# Patient Record
Sex: Female | Born: 1955 | Race: White | Hispanic: No | Marital: Married | Smoking: Former smoker
Health system: Southern US, Community
[De-identification: ages and names within clinical notes are randomized; demographics above are authoritative.]

## PROBLEM LIST (undated history)

## (undated) DIAGNOSIS — M1990 Unspecified osteoarthritis, unspecified site: Secondary | ICD-10-CM

## (undated) DIAGNOSIS — Z9889 Other specified postprocedural states: Secondary | ICD-10-CM

## (undated) DIAGNOSIS — D649 Anemia, unspecified: Secondary | ICD-10-CM

## (undated) DIAGNOSIS — C50412 Malignant neoplasm of upper-outer quadrant of left female breast: Principal | ICD-10-CM

## (undated) DIAGNOSIS — D219 Benign neoplasm of connective and other soft tissue, unspecified: Secondary | ICD-10-CM

## (undated) DIAGNOSIS — E785 Hyperlipidemia, unspecified: Secondary | ICD-10-CM

## (undated) DIAGNOSIS — J189 Pneumonia, unspecified organism: Secondary | ICD-10-CM

## (undated) DIAGNOSIS — Z8249 Family history of ischemic heart disease and other diseases of the circulatory system: Secondary | ICD-10-CM

## (undated) DIAGNOSIS — Z5111 Encounter for antineoplastic chemotherapy: Secondary | ICD-10-CM

## (undated) DIAGNOSIS — R112 Nausea with vomiting, unspecified: Secondary | ICD-10-CM

## (undated) DIAGNOSIS — G629 Polyneuropathy, unspecified: Secondary | ICD-10-CM

## (undated) HISTORY — DX: Benign neoplasm of connective and other soft tissue, unspecified: D21.9

## (undated) HISTORY — DX: Family history of ischemic heart disease and other diseases of the circulatory system: Z82.49

## (undated) HISTORY — PX: DILATION AND CURETTAGE OF UTERUS: SHX78

## (undated) HISTORY — PX: TONSILLECTOMY: SUR1361

## (undated) HISTORY — PX: GANGLION CYST EXCISION: SHX1691

## (undated) HISTORY — DX: Hyperlipidemia, unspecified: E78.5

## (undated) HISTORY — DX: Malignant neoplasm of upper-outer quadrant of left female breast: C50.412

## (undated) HISTORY — PX: ANTERIOR CRUCIATE LIGAMENT REPAIR: SHX115

---

## 1976-05-25 HISTORY — PX: WISDOM TOOTH EXTRACTION: SHX21

## 1987-05-26 HISTORY — PX: LAPAROSCOPIC ENDOMETRIOSIS FULGURATION: SUR769

## 1988-05-25 HISTORY — PX: ABDOMINAL HYSTERECTOMY: SHX81

## 1998-10-04 ENCOUNTER — Other Ambulatory Visit: Admission: RE | Admit: 1998-10-04 | Discharge: 1998-10-04 | Payer: Self-pay | Admitting: Family Medicine

## 2004-09-09 ENCOUNTER — Ambulatory Visit: Payer: Self-pay | Admitting: Family Medicine

## 2005-05-25 HISTORY — PX: CYST EXCISION: SHX5701

## 2005-08-12 ENCOUNTER — Ambulatory Visit: Payer: Self-pay | Admitting: Family Medicine

## 2009-08-21 ENCOUNTER — Ambulatory Visit: Payer: Self-pay | Admitting: Cardiovascular Disease

## 2009-08-22 ENCOUNTER — Telehealth: Payer: Self-pay | Admitting: Cardiovascular Disease

## 2010-06-24 NOTE — Progress Notes (Signed)
  Phone Note Outgoing Call   Call placed by: Dessie Coma LPN Call placed to: Patient Summary of Call: Patient notified per Dr. Freida Busman, labs are completely normal specifically no indication of blood clot.  No further studies needed at this time.

## 2010-10-07 NOTE — Assessment & Plan Note (Signed)
[facility]                         CARDIOLOGY OFFICE NOTE   NAME:Essman, COSTELLA SCHWARZ                     MRN:          [id number]  DATE:08/21/2009                            DOB:          1955/11/01    CHIEF COMPLAINT:  History of chest pain.   HISTORY OF PRESENT ILLNESS:  Ms. Bang is a 55 year old white female  with past medical history significant for family history of premature  coronary artery disease presenting for further evaluation after an  episode of chest discomfort on 8th of this month.  The patient states  she woke that morning with a distinct area of left-sided chest  discomfort associated with some shortness of breath and perhaps mild  dizziness.  The patient reported to her primary care physician's office  and was eventually sent to the emergency room.  The pain was somewhat  relieved with nitroglycerin.  It did not radiate.  Pain was present for  approximately 6-7 hours.  The patient was admitted to the hospital and  ruled out for myocardial infarction with completely normal cardiac  biomarkers.  She underwent an echocardiogram which demonstrated normal  ejection fraction.  She also underwent a exercise stress test which she  exercised to 6 METS and was found to have an ejection fraction of 60%  and no areas of inducible ischemia.  The patient states that she had  increased fatigue and some dyspnea on exertion for several days after  the stress test; however, she has since returned to her baseline.  She  has not had any recurrent chest discomfort or shortness of breath.  Of  note, the patient states several years ago she had a similar episode of  chest discomfort that is also followed by negative stress test.   PAST MEDICAL HISTORY:  Mild hyperlipidemia, orthopedic injuries to the  right knee.  She has had 4 C-sections and abdominal laparoscopy,  tonsillectomy, and ganglion cyst removal.   SOCIAL HISTORY:  She smoked for  about 6-9 months in her 38s.  No  recurrent tobacco use.  No significant alcohol intake.   ALLERGIES:  VICODIN.   CURRENT MEDICATIONS:  Aspirin 81 mg daily, omeprazole 20 mg daily which  was started after this episode of chest discomfort, metoprolol 12.5 mg  b.i.d. which she started after this episode of chest discomfort.  Lipitor 20 mg daily.  Cranberry tablets, fiber tablets, multivitamin,  and vitamin C.   REVIEW OF SYSTEMS:  Positive for some psychosocial stressors.  Other  systems as in HPI are otherwise negative.   PHYSICAL EXAMINATION:  VITAL SIGNS:  Her blood pressure is 119/68, pulse  is 83, satting 96% on room air, and she weighs 164 pounds.  GENERAL:  No acute distress.  HEENT:  Normocephalic, atraumatic.  NECK:  Supple.  There is no JVD.  There is no carotid bruit.  HEART:  Regular rate and rhythm without murmur, rub, or gallop.  LUNGS:  Clear bilaterally.  ABDOMEN:  Soft, nontender, nondistended.  EXTREMITIES:  Without edema.  SKIN:  Warm and dry.  PSYCHIATRIC:  The patient is appropriate with normal levels of insight.  MUSCULOSKELETAL:  A 5/5 bilateral upper and lower extremity strength.  NEURO:  Nonfocal.   EKG taken today in clinic independently reviewed by myself demonstrates  normal sinus rhythm without any significant ST or T-wave abnormalities.  Review of the stress test report as above in HPI.   I personally reviewed the echocardiogram which demonstrates an ejection  fraction in the low normal range of 50-55%.  Her mitral valve and flow  pattern was normal as was her tissue Doppler.  Her ED prime ratio was 7.  Review of the patient's labs indicated a completely normal troponins as  an inpatient.  Review of her BMP and CBC were also within normal limits.   ASSESSMENT:  A 55 year old female with mild hyperlipidemia and family  history of premature coronary artery disease presenting with an episode  of atypical chest discomfort followed by some shortness of  breath for  the next few days.  It does not appear that these symptoms were cardiac  in origin.  Because of the increased dyspnea, pulmonary embolism remains  a possibility.  Fortunately, the patient's symptoms have now completely  resolved.   PLAN:  We will check a D-dimer today.  If the D-dimer is abnormal, we  will pursue further imaging to rule out venous thrombosis.  I see no  need for a further ischemia evaluation at this time.  The patient is  reminded that because of her family history of premature coronary artery  disease, she is at increased risk for developing significant coronary  artery disease in the future.  She is encouraged to partake in an  exercise routine of at least 30 minutes daily.  It is reasonable for her  to continue on aspirin 81 mg daily.  Her other risk factors and health  maintenance screening including a mammograms which she is not up-to-date  with will be followed up by her primary care physician.  We will contact  her after the results of the D-dimer; otherwise, we will see her in 1  year's time.  She will contact our office in the interim if any problems  occur.     Brayton El, MD  Electronically Signed    SGA/MedQ  DD: 08/21/2009  DT: 08/22/2009  Job #: [id number]

## 2010-10-07 NOTE — Letter (Signed)
August 21, 2009    Marlow Baars, MD  [address]  [place], [place] [address]   RE:  Alexandra Mathis, Alexandra Mathis  MRN:  [id number]  /  DOB:  Apr 13, 1956   Dear Dr. Ardyth Gal:   I had the pleasure of seeing Alexandra Mathis in clinic this morning.  As  you know, she is a 55 year old female who recently had an episode of  chest discomfort that was followed by stress test and echocardiogram.  Although her exercise tolerance during the stress test was somewhat less  than expected, her imaging was negative for any inducible ischemia.  I  have personally reviewed the echocardiogram which demonstrates low  normal ejection fraction of approximately 55%.  There is no evidence of  diastolic dysfunction on the study.  I believe that her chest discomfort  was noncardiac in etiology and it has not reoccurred.  I am somewhat  concerned that she remained short of breath for several days after the  stress test.  I will be checking a D-dimer in clinic today and if it is  abnormal, will pursue further workup for venous thromboembolism.   Thank you for the referral of this patient and I look forward to  following her along with you.  Please contact my office if I can be of  any further assistance.    Sincerely,      Brayton El, MD  Electronically Signed    SGA/MedQ  DD: 08/21/2009  DT: 08/21/2009  Job #: [id number]

## 2011-01-22 ENCOUNTER — Encounter: Payer: Self-pay | Admitting: Cardiovascular Disease

## 2011-02-13 ENCOUNTER — Encounter: Payer: Self-pay | Admitting: Cardiovascular Disease

## 2011-03-25 ENCOUNTER — Encounter: Payer: Self-pay | Admitting: Cardiovascular Disease

## 2015-05-26 HISTORY — PX: BREAST BIOPSY: SHX20

## 2015-12-05 ENCOUNTER — Encounter: Payer: Self-pay | Admitting: *Deleted

## 2015-12-05 ENCOUNTER — Telehealth: Payer: Self-pay | Admitting: *Deleted

## 2015-12-05 DIAGNOSIS — Z170 Estrogen receptor positive status [ER+]: Secondary | ICD-10-CM

## 2015-12-05 DIAGNOSIS — C50412 Malignant neoplasm of upper-outer quadrant of left female breast: Secondary | ICD-10-CM

## 2015-12-05 HISTORY — DX: Malignant neoplasm of upper-outer quadrant of left female breast: C50.412

## 2015-12-05 NOTE — Telephone Encounter (Signed)
Confirmed [facility] for 12/11/15 at 0815 .  Instructions and contact information given.

## 2015-12-05 NOTE — Telephone Encounter (Signed)
Mailed clinic packet to pt.  

## 2015-12-11 ENCOUNTER — Encounter: Payer: Self-pay | Admitting: Physical Therapy

## 2015-12-11 ENCOUNTER — Ambulatory Visit: Payer: BC Managed Care – PPO | Attending: General Surgery | Admitting: Physical Therapy

## 2015-12-11 ENCOUNTER — Telehealth: Payer: Self-pay | Admitting: Oncology

## 2015-12-11 ENCOUNTER — Other Ambulatory Visit (HOSPITAL_BASED_OUTPATIENT_CLINIC_OR_DEPARTMENT_OTHER): Payer: BC Managed Care – PPO

## 2015-12-11 ENCOUNTER — Other Ambulatory Visit: Payer: Self-pay | Admitting: *Deleted

## 2015-12-11 ENCOUNTER — Ambulatory Visit
Admission: RE | Admit: 2015-12-11 | Discharge: 2015-12-11 | Disposition: A | Discharge status: Home / Self Care | Payer: BC Managed Care – PPO | Source: Ambulatory Visit | Attending: Radiation Oncology | Admitting: Radiation Oncology

## 2015-12-11 ENCOUNTER — Ambulatory Visit (HOSPITAL_BASED_OUTPATIENT_CLINIC_OR_DEPARTMENT_OTHER): Payer: BC Managed Care – PPO | Admitting: Oncology

## 2015-12-11 ENCOUNTER — Encounter: Payer: Self-pay | Admitting: Oncology

## 2015-12-11 ENCOUNTER — Other Ambulatory Visit: Payer: Self-pay | Admitting: General Surgery

## 2015-12-11 ENCOUNTER — Encounter: Payer: Self-pay | Admitting: *Deleted

## 2015-12-11 VITALS — BP 121/79 | HR 86 | Temp 98.4°F | Resp 18 | Ht 67.5 in | Wt 151.5 lb

## 2015-12-11 DIAGNOSIS — C50412 Malignant neoplasm of upper-outer quadrant of left female breast: Secondary | ICD-10-CM | POA: Insufficient documentation

## 2015-12-11 DIAGNOSIS — R293 Abnormal posture: Secondary | ICD-10-CM | POA: Diagnosis present

## 2015-12-11 DIAGNOSIS — Z170 Estrogen receptor positive status [ER+]: Secondary | ICD-10-CM

## 2015-12-11 LAB — CBC WITH DIFFERENTIAL/PLATELET
BASO%: 0.5 % (ref 0.0–2.0)
Basophils Absolute: 0 10*3/uL (ref 0.0–0.1)
EOS%: 1.5 % (ref 0.0–7.0)
Eosinophils Absolute: 0.1 10*3/uL (ref 0.0–0.5)
HCT: 37.9 % (ref 34.8–46.6)
HGB: 13 g/dL (ref 11.6–15.9)
LYMPH%: 37.6 % (ref 14.0–49.7)
MCH: 30 pg (ref 25.1–34.0)
MCHC: 34.3 g/dL (ref 31.5–36.0)
MCV: 87.5 fL (ref 79.5–101.0)
MONO#: 0.5 10*3/uL (ref 0.1–0.9)
MONO%: 12.1 % (ref 0.0–14.0)
NEUT#: 1.9 10*3/uL (ref 1.5–6.5)
NEUT%: 48.3 % (ref 38.4–76.8)
PLATELETS: 156 10*3/uL (ref 145–400)
RBC: 4.33 10*6/uL (ref 3.70–5.45)
RDW: 13.6 % (ref 11.2–14.5)
WBC: 3.9 10*3/uL (ref 3.9–10.3)
lymph#: 1.5 10*3/uL (ref 0.9–3.3)

## 2015-12-11 LAB — COMPREHENSIVE METABOLIC PANEL
ALT: 15 U/L (ref 0–55)
ANION GAP: 8 meq/L (ref 3–11)
AST: 19 U/L (ref 5–34)
Albumin: 4 g/dL (ref 3.5–5.0)
Alkaline Phosphatase: 64 U/L (ref 40–150)
BUN: 16.6 mg/dL (ref 7.0–26.0)
CHLORIDE: 105 meq/L (ref 98–109)
CO2: 28 meq/L (ref 22–29)
CREATININE: 0.8 mg/dL (ref 0.6–1.1)
Calcium: 9.5 mg/dL (ref 8.4–10.4)
EGFR: 78 mL/min/{1.73_m2} — ABNORMAL LOW (ref >90)
Glucose: 92 mg/dl (ref 70–140)
Potassium: 4.8 mEq/L (ref 3.5–5.1)
SODIUM: 141 meq/L (ref 136–145)
Total Bilirubin: 0.56 mg/dL (ref 0.20–1.20)
Total Protein: 7.5 g/dL (ref 6.4–8.3)

## 2015-12-11 NOTE — Telephone Encounter (Signed)
appt made and avs printed °

## 2015-12-11 NOTE — Progress Notes (Signed)
[facility]  [phone] [phone]     ID: MAEZIE JUSTIN DOB: [birth year]  MR#: [id number]  YSA#:[id number]  Patient Care Team: Darrol Jump, PA-C as PCP - General (Family Medicine) Fanny Skates, MD as Consulting Physician (General Surgery) Chauncey Cruel, MD as Consulting Physician (Oncology) Kyung Rudd, MD as Consulting Physician (Radiation Oncology) Larey Dresser, MD as Consulting Physician (Cardiology) OTHER MD:  CHIEF COMPLAINT: Triple positive breast cancer  CURRENT TREATMENT: Paclitaxel, trastuzumab, and pertuzumab   BREAST CANCER HISTORY: "Alexandra Mathis" had screening mammography in June 2017 showing upper outer quadrant calcifications. Accordingly she was referred for left diagnostic mammography and ultrasonography, performed 11/27/2015. The breast density was category C. Compression views of the left breast found pleomorphic calcifications in the upper outer quadrant measuring up to 4.0 cm. There was an area of associated architectural distortion in the upper outer quadrant as well. On physical exam there was an irregular thickening in the left breast at the 2:00 position anteriorly. Ultrasonography confirmed a hypoechoic irregular mass measuring 1.6 cm in the 2:00 position 2 cm from the nipple. An intramammary lymph node was noted with benign appearing. In the left axilla there was a lymph node with focally thickened cortex.  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 Biopsy of the left axillary lymph node was negative.  Her subsequent history is as detailed below  INTERVAL HISTORY: Alexandra Mathis was evaluated in the multidisciplinary breast  cancer clinic 12/11/2015 accompanied by her husband Alexandra Mathis. Her case was also presented in the multidisciplinary breast cancer conference that same morning. At that time a preliminary plan was proposed: Neoadjuvant chemotherapy and anti-HER-2 immunotherapy, genetics testing, consideration of mastectomy versus ample lumpectomy and sentinel lymph node sampling, followed by radiation and anti-estrogens  REVIEW OF SYSTEMS: There were no specific symptoms leading to the original mammogram, which was routinely scheduled. The patient denies unusual headaches, visual changes, nausea, vomiting, stiff neck, dizziness, or gait imbalance. There has been no cough, phlegm production, or pleurisy, no chest pain or pressure, and no change in bowel or bladder habits. The patient denies fever, rash, bleeding, unexplained fatigue or unexplained weight loss. She has minor arthritis pain involving her back and hands, which is not more intense or persistent than prior. A detailed review of systems was otherwise entirely negative.  PAST MEDICAL HISTORY: Past Medical History  Diagnosis Date  . Chest pain   . Family history of premature CAD   . HLD (hyperlipidemia)   . Breast cancer of upper-outer quadrant of left female breast ([facility]) 12/05/2015  . Breast cancer ([facility])     PAST SURGICAL HISTORY: Past Surgical History  Procedure Laterality Date  . Tonsillectomy    . Ganglion cyst excision    . Cesarean section      x4  . Anterior cruciate ligament repair      right knee  . Hysterectomy - unknown type      FAMILY HISTORY Family History  Problem Relation Age of Onset  . Cancer    . Heart attack    . Other Mother     [place]ine Meningioma  . Colon cancer Father   . Ovarian cancer Sister   . Breast cancer Sister   .  Other Paternal Aunt     Peritoneal cancer  . Cancer Paternal Uncle   . Ovarian cancer Sister   . Breast cancer Sister   . Breast cancer Cousin   . Ovarian cancer Cousin   . Ovarian cancer Cousin     . Breast cancer Cousin   . Breast cancer Cousin     GYNECOLOGIC HISTORY:  No LMP recorded. Menarche age 9, first live birth age 60. The patient is GX P4. She stopped having periods in 1989. She did not use hormone replacement. She used oral contraceptives less than one year.  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 Her husband Alexandra Mathis is retired from a [employer]. Son Alexandra Mathis lives in [place] and works as a Chief Strategy Officer. Son Alexandra Mathis lives in [place] and is with the [employer]. Son Alexandra Mathis this and cocoa [place] where he works as an Chief Financial Officer. Son Alexandra Mathis lives in [place] were he works as an Chief Financial Officer. The patient has 4 grandchildren including a 85-year-old they have custody of and lives with them. The patient attends our [employer]   HEALTH MAINTENANCE: Social History  Substance Use Topics  . Smoking status: Former Smoker -- .8 years    Quit date: 05/25/1974  . Smokeless tobacco: None  . Alcohol Use: 0.5 oz/week    1 Standard drinks or equivalent per week     Colonoscopy:  PAP:  Bone density:   Allergies  Allergen Reactions  . Morphine And Related   . Vicodin [Hydrocodone-Acetaminophen]     Current Outpatient Prescriptions  Medication Sig Dispense Refill  . aspirin 81 MG tablet Take 81 mg by mouth daily.      Marland Kitchen atorvastatin (LIPITOR) 20 MG tablet Take 20 mg by mouth daily.      Marland Kitchen etodolac (LODINE) 400 MG tablet Take 400 mg by mouth 2 (two) times daily with a meal.  2  . FIBER COMPLETE PO Take by mouth daily.      . Multiple Vitamin (MULTIVITAMIN) tablet Take 1 tablet by mouth daily.      . Vitamins C E (CRANBERRY CONCENTRATE PO) Take by mouth daily.      . metoprolol tartrate (LOPRESSOR) 25 MG tablet Take 12.5 mg by mouth 2 (two) times daily.      Marland Kitchen omeprazole (PRILOSEC) 20 MG capsule Take 20 mg by mouth daily.      . vitamin C (ASCORBIC ACID) 500 MG tablet Take 500  mg by mouth daily.       No current facility-administered medications for this visit.    OBJECTIVE: Middle-aged white woman who appears younger than stated age 60 Vitals:   12/11/15 0905  BP: 121/79  Pulse: 86  Temp: 98.4 F (36.9 C)  Resp: 18     Body mass index is 23.36 kg/(m^2).    ECOG FS:0 - Asymptomatic  Ocular: Sclerae unicteric, pupils equal, round and reactive to light Ear-nose-throat: Oropharynx clear and moist Lymphatic: No cervical or supraclavicular adenopathy Lungs no rales or rhonchi, good excursion bilaterally Heart regular rate and rhythm, no murmur appreciated Abd soft, nontender, positive bowel sounds MSK no focal spinal tenderness, no joint edema Neuro: non-focal, well-oriented, appropriate affect Breasts: The right breast is unremarkable. The left breast is status post recent biopsy and there is a significant ecchymosis. There is no other finding of concern. There is no nipple retraction. The left axilla is benign.   LAB  RESULTS:  CMP     Component Value Date/Time   NA 141 12/11/2015 0839   K 4.8 12/11/2015 0839   CO2 28 12/11/2015 0839   GLUCOSE 92 12/11/2015 0839   BUN 16.6 12/11/2015 0839   CREATININE 0.8 12/11/2015 0839   CALCIUM 9.5 12/11/2015 0839   PROT 7.5 12/11/2015 0839   ALBUMIN 4.0 12/11/2015 0839   AST 19 12/11/2015 0839   ALT 15 12/11/2015 0839   ALKPHOS 64 12/11/2015 0839   BILITOT 0.56 12/11/2015 0839    INo results found for: SPEP, UPEP  Lab Results  Component Value Date   WBC 3.9 12/11/2015   NEUTROABS 1.9 12/11/2015   HGB 13.0 12/11/2015   HCT 37.9 12/11/2015   MCV 87.5 12/11/2015   PLT 156 12/11/2015      Chemistry      Component Value Date/Time   NA 141 12/11/2015 0839   K 4.8 12/11/2015 0839   CO2 28 12/11/2015 0839   BUN 16.6 12/11/2015 0839   CREATININE 0.8 12/11/2015 0839      Component Value Date/Time   CALCIUM 9.5 12/11/2015 0839   ALKPHOS 64 12/11/2015 0839   AST 19 12/11/2015 0839   ALT 15  12/11/2015 0839   BILITOT 0.56 12/11/2015 0839       No results found for: LABCA2  No components found for: [id number]  No results for input(s): INR in the last 168 hours.  Urinalysis No results found for: COLORURINE, APPEARANCEUR, LABSPEC, PHURINE, GLUCOSEU, HGBUR, BILIRUBINUR, KETONESUR, PROTEINUR, UROBILINOGEN, NITRITE, LEUKOCYTESUR   STUDIES: Outside films reviewed  ELIGIBLE FOR AVAILABLE RESEARCH PROTOCOL: no  ASSESSMENT: 60 y.o. [place], [place] woman status post left breast upper outer quadrant biopsy 12/02/2015 for a clinical T1c pN0, stage IA  invasive ductal carcinoma, grade 3, estrogen and progesterone receptor positive, with an MIB-1 of 15%, and HER-2 amplification  (1) neoadjuvant chemotherapy will consist of weekly paclitaxel 12 with trastuzumab and pertuzumab every 21 days 4  (2) trastuzumab to be continued to total of one year  (a) baseline echocardiogram pending  (3) definitive surgery to follow chemotherapy  (4) adjuvant radiation to follow as appropriate  (5) anti-estrogens to follow at the completion of local treatment  (6) genetics testing pending     PLAN: We spent the better part of today's hour-long appointment discussing the biology of breast cancer in general, and the specifics of the patient's tumor in particular. We first reviewed the fact that cancer is not one disease but more than 100 different diseases and that it is important to keep them separate-- otherwise when friends and relatives discuss their own cancer experiences with Alexandra Mathis        confusion can result. Similarly we discussed that if breast cancer spreads to the bones, lungs or liver, Alexandra Mathis would not have bone cancer or liver cancer, but breast cancer in the bone and breast cancer in the liver-- one cancer in three places-- not three different cancers, which otherwise would have to be treated in 3 different ways.  We emphasized the difference between local and systemic therapies.  In terms of loco/regional treatment, lumpectomy plus radiation is equivalent to mastectomy as far as survival is concerned. We also noted that in terms of sequencing of treatments, whether systemic therapy or surgery is done first does not affect the ultimate outcome.  We then reviewed systemic therapy options. These include anti-estrogens, anti-HER-2 treatment, and chemotherapy. She is a good candidate for all 3. This is very favorable because if we assume  her risk of recurrence (after local treatment only) is X, chemotherapy will lower X by one third, anti-HER-2 treatment will cut the remaining 2/3X in half, and anti-estrogen treatment will cut the remaining 1/3X in half again, so her final risk of recurrence will be approximately 1/6 of her baseline risk.  A high percentage of patients like Alexandra Mathis achieve a complete pathologic response with neo-adjuvant chemotherapy. This means when the surgeon removes the area of cancer, no cancer is seen by pathology in the breast or lymph nodes. Patients who achieve a complete pathologic response have a better long term prognosis. In addition, Alexandra Mathis needs to undergo genetics testing, and if positive she would consider bilateral mastectomies. This means her definitive surgery we will be delayed while she explores the multiple possibilities available.  Accordingly we recommend starting with systemic treatment in Alexandra Mathis's case. Standard therapy in this situation is paclitaxel given weekly 12, with trastuzumab, and pertuzumab given every 21 days 4, followed by continuing trastuzumab to complete one year. We discussed the possible toxicities, side effects and complications of these treatments including the possibility of damage to the heart muscle, which is the reason she will see cardio oncology and undergo echocardiography every 3 months while on anti-HER-2 treatment  Alexandra Mathis will also meet with our chemotherapy teaching nurse, have a breast MRI, and have a port placed  prior to the anticipated start of chemotherapy which will be August 4. She will see me a week before to go over anti-emetics and other supportive medications.  She has a good understanding of the overall plan. She agrees with it. She knows the goal of treatment in her case is cure. She will call with any problems that may develop before her next visit here.  Chauncey Cruel, MD   12/11/2015 11:29 AM Medical Oncology and Hematology [facility] [address] [place], [place] [address] Tel. [phone]    Fax. [phone]

## 2015-12-11 NOTE — Progress Notes (Signed)
Clinical Social Work [facility] Psychosocial Distress Screening [facility]  Patient completed distress screening protocol and scored an 8 on the Psychosocial Distress Thermometer which indicates moderate distress. Clinical Social Worker met with patient and patients husband in [facility] to assess for distress and other psychosocial needs. Patient stated she was feeling overwhelmed but felt "better" after meeting with the treatment team and getting more information on her treatment plan. Patient was tearful at times and expressed some anxiety associated with the stress her diagnosis and treatment would have on her family.  CSW and patient discussed common feeling and emotions when being diagnosed with cancer, and the importance of support during treatment. CSW informed patient of the support team and support services at [facility]. CSW provided contact information and encouraged patient to call with any questions or concerns.   [facility] DISTRESS SCREENING 12/11/2015  Screening Type Initial Screening  Distress experienced in past week (1-10) 8  Practical problem type Insurance  Family Problem type Partner  Emotional problem type Nervousness/Anxiety  Information Concerns Type Lack of info about diagnosis  Referral to clinical social work Yes   Abigail Elmore, MSW, LCSW, OSW-C Clinical Social Worker [facility] [phone]       

## 2015-12-11 NOTE — Therapy (Signed)
[place], [place], [address] Phone: [phone]   Fax:  [phone]  Physical Therapy Evaluation  Patient Details  Name: Alexandra Mathis MRN: [id number] Date of Birth: 03-Jul-1955 Referring Provider: Dr. Fanny Skates  Encounter Date: 12/11/2015      PT End of Session - 12/11/15 1305    Visit Number 1   Number of Visits 1   PT Start Time 5974   PT Stop Time 1220   PT Time Calculation (min) 35 min   Activity Tolerance Patient tolerated treatment well   Behavior During Therapy [facility] for tasks assessed/performed      Past Medical History  Diagnosis Date  . Chest pain   . Family history of premature CAD   . HLD (hyperlipidemia)   . Breast cancer of upper-outer quadrant of left female breast ([place]) 12/05/2015  . Breast cancer [facility])     Past Surgical History  Procedure Laterality Date  . Tonsillectomy    . Ganglion cyst excision    . Cesarean section      x4  . Anterior cruciate ligament repair      right knee  . Hysterectomy - unknown type      There were no vitals filed for this visit.       Subjective Assessment - 12/11/15 1309    Currently in Pain? Yes   Pain Score 2    Pain Location Back  and neck   Pain Orientation Upper;Lower   Pain Descriptors / Indicators Tightness;Aching   Pain Type Chronic pain   Pain Onset More than a month ago   Pain Frequency Intermittent   Aggravating Factors  Prolonged sitting and stress   Pain Relieving Factors Going to her chiropractor   Multiple Pain Sites No            [facility] PT Assessment - 12/11/15 0001    Assessment   Medical Diagnosis Left breast cancer   Referring Provider Dr. Fanny Skates   Onset Date/Surgical Date 11/19/15   Hand Dominance Left   Prior Therapy none   Precautions   Precautions Other (comment)   Precaution Comments Active cancer   Restrictions   Weight Bearing Restrictions No   Balance Screen   Has the  patient fallen in the past 6 months No   Has the patient had a decrease in activity level because of a fear of falling?  No   Is the patient reluctant to leave their home because of a fear of falling?  No   Home Ecologist residence   Living Arrangements Spouse/significant other;Children  Husband and 3 y.o. granddaughter   Available Help at Discharge Family   Prior Function   Level of Independence Independent   Vocation Full time employment   English as a second language teacher   Leisure She does the elliptical 30 minutes 5x/week   Cognition   Overall Cognitive Status Within Functional Limits for tasks assessed   Posture/Postural Control   Posture/Postural Control Postural limitations   Postural Limitations Forward head;Rounded Shoulders   ROM / Strength   AROM / PROM / Strength AROM;Strength   AROM   AROM Assessment Site Shoulder   Right/Left Shoulder Right;Left   Right Shoulder Extension 55 Degrees   Right Shoulder Flexion 142 Degrees   Right Shoulder ABduction 174 Degrees   Right Shoulder Internal Rotation 83 Degrees   Right Shoulder External Rotation 73 Degrees   Left Shoulder Extension 57  Degrees   Left Shoulder Flexion 140 Degrees   Left Shoulder ABduction 157 Degrees   Left Shoulder Internal Rotation 78 Degrees   Left Shoulder External Rotation 73 Degrees   Strength   Overall Strength Within functional limits for tasks performed           LYMPHEDEMA/ONCOLOGY QUESTIONNAIRE - 12/11/15 1253    Type   Cancer Type Left breast cancer   Lymphedema Assessments   Lymphedema Assessments Upper extremities   Right Upper Extremity Lymphedema   10 cm Proximal to Olecranon Process 28.2 cm   Olecranon Process 24.4 cm   10 cm Proximal to Ulnar Styloid Process 22.3 cm   Just Proximal to Ulnar Styloid Process 16.1 cm   Across Hand at [employer] 20.6 cm   At [place] of 2nd Digit 6.6 cm   Left Upper Extremity Lymphedema   10 cm  Proximal to Olecranon Process 28.2 cm   Olecranon Process 24.3 cm   10 cm Proximal to Ulnar Styloid Process 21.4 cm   Just Proximal to Ulnar Styloid Process 15.5 cm   Across Hand at [employer] 20.4 cm   At [place] of 2nd Digit 6.7 cm       Patient was instructed today in a home exercise program today for post op shoulder range of motion. These included active assist shoulder flexion in sitting, scapular retraction, wall walking with shoulder abduction, and hands behind head external rotation.  She was encouraged to do these twice a day, holding 3 seconds and repeating 5 times when permitted by her physician.         PT Education - 12/11/15 1304    Education provided Yes   Education Details Lymphedema risk reduction and post op shoulder ROM HEP   Person(s) Educated Patient;Spouse   Methods Explanation;Demonstration;Handout   Comprehension Returned demonstration;Verbalized understanding              Breast Clinic Goals - 12/11/15 1315    Patient will be able to verbalize understanding of pertinent lymphedema risk reduction practices relevant to her diagnosis specifically related to skin care.   Time 1   Period Days   Status Achieved   Patient will be able to return demonstrate and/or verbalize understanding of the post-op home exercise program related to regaining shoulder range of motion.   Time 1   Period Days   Status Achieved   Patient will be able to verbalize understanding of the importance of attending the postoperative After Breast Cancer Class for further lymphedema risk reduction education and therapeutic exercise.   Time 1   Period Days   Status Achieved              Plan - 12/11/15 1306    Clinical Impression Statement Patient was diagnosed on 11/19/15 with left Triple negative breast cancer.  It is located in the upper outer quadrant, is Grade 3, and measures 1.6 cm with additional calcifications.  Her multidisciplinary medical team met prior to her  assessments to determine a recommended treatment plan.  She is planning to have neoadjuvant chemotherapy followed by a left mastectomy and sentinel node biopsy and anti-estrogen therapy.  She may benefit from post op PT to regain shoulder ROM and reduce lymphedema risk.  Due to her comorbidities of neck and back pain, and her social situation of being the guardian of her 24 year old grandchild, her evaluation is of moderate complexity.   Rehab Potential Excellent   Clinical Impairments Affecting Rehab Potential  Back and neck pain; scheduling limitations as she lives 40 minutes away and cares for her 57 y.o. granddaughter   PT Frequency One time visit   PT Treatment/Interventions Therapeutic exercise;Patient/family education   PT Next Visit Plan Will f/u after surgery to determine needs   PT Home Exercise Plan Post op shoulder ROM HEP   Consulted and Agree with Plan of Care Patient;Family member/caregiver   Family Member Consulted Husband      Patient will benefit from skilled therapeutic intervention in order to improve the following deficits and impairments:  Decreased strength, Pain, Decreased knowledge of precautions, Impaired UE functional use, Decreased range of motion  Visit Diagnosis: Carcinoma of upper-outer quadrant of left female breast ([place]) - Plan: PT plan of care cert/re-cert  Abnormal posture - Plan: PT plan of care cert/re-cert   Patient will follow up at outpatient cancer rehab if needed following surgery.  If the patient requires physical therapy at that time, a specific plan will be dictated and sent to the referring physician for approval. The patient was educated today on appropriate basic range of motion exercises to begin post operatively and the importance of attending the After Breast Cancer class following surgery.  Patient was educated today on lymphedema risk reduction practices as it pertains to recommendations that will benefit the patient immediately following surgery.   She verbalized good understanding.  No additional physical therapy is indicated at this time.      Problem List Patient Active Problem List   Diagnosis Date Noted  . Breast cancer of upper-outer quadrant of left female breast ([place]) 12/05/2015   Annia Friendly, PT 12/11/2015 1:21 PM  [place], [place], [address] Phone: [phone]   Fax:  [phone]  Name: Alexandra Mathis MRN: [id number] Date of Birth: [birth year]

## 2015-12-11 NOTE — Patient Instructions (Signed)

## 2015-12-11 NOTE — Progress Notes (Addendum)
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   HISTORY OF PRESENT ILLNESS: Ms. Franki Cabot is a 60 y.o. Caucasian female who presents to the multidisciplinary breast clinic today to discuss her recent diagnoses of an upper-outer breast cancer of the left breast. The patient was referred to the clinic by Darrol Jump, Family Medicine PA-C. She was found to have calcifications of the upper outer quadrant of the left breast. A diagnostic mammogram and ultrasound on 11/27/15 revealed pleomorphic calcifications measuring up to 4 cm in the upper outer quadrant of the left breast with architectural distortion. Ultrasound confirmed a hypoechocic irregular mass measuring 1.6 cm lesion and a left inframammary node was benign in appearance. The left axillary node was noted to have focal thickening in the cortex. A biopsy on 12/02/15 of the left axillary node and the breast mass revealed  invasive ductal carcinoma, grade 3, and ductal carcinoma in situ, involving multiple cores, grade 3, ER/PR positive, and HER-2 amplification.  The lymph node needle core biopsy was negative for metastatic carcinoma. She comes today to discuss the possible role of radiotherapy. She has met with Dr. Dalbert Batman and Dr. Jana Hakim and the proposed treatment would be for neoadjuvant systemic treatment.    PREVIOUS RADIATION THERAPY: No   PAST MEDICAL HISTORY:  Past Medical History  Diagnosis Date  . Chest pain   . Family history of premature CAD   . HLD (hyperlipidemia)   . Breast cancer of upper-outer quadrant of left female breast ([place]) 12/05/2015  . Breast cancer ([place])     PAST SURGICAL HISTORY: Past Surgical History  Procedure Laterality Date  .  Tonsillectomy    . Ganglion cyst excision    . Cesarean section      x4  . Anterior cruciate ligament repair      right knee  . Hysterectomy - unknown type       FAMILY HISTORY:  Family History  Problem Relation Age of Onset  . Cancer    . Heart attack    . Other Mother     [place] Meningioma  . Colon cancer Father   . Ovarian cancer Sister   . Breast cancer Sister   . Other Paternal Aunt     Peritoneal cancer  . Cancer Paternal Uncle   . Ovarian cancer Sister   . Breast cancer Sister   . Breast cancer Cousin   . Ovarian cancer Cousin   . Ovarian cancer Cousin   . Breast cancer Cousin   . Breast cancer Cousin     SOCIAL HISTORY:  reports that she quit smoking about 41 years ago. She does not have any smokeless tobacco history on file. She reports that she drinks about 0.5 oz of alcohol per week. She reports that she does not use illicit drugs.    ALLERGIES: Morphine and related and Vicodin   MEDICATIONS:  Current Outpatient Prescriptions  Medication Sig Dispense Refill  . aspirin 81 MG tablet Take 81 mg by mouth daily.      Marland Kitchen atorvastatin (LIPITOR) 20 MG tablet Take 20 mg by mouth daily.      Marland Kitchen etodolac (LODINE) 400 MG tablet Take 400 mg by mouth 2 (two) times daily with a meal.  2  .  FIBER COMPLETE PO Take by mouth daily.      . metoprolol tartrate (LOPRESSOR) 25 MG tablet Take 12.5 mg by mouth 2 (two) times daily.      . Multiple Vitamin (MULTIVITAMIN) tablet Take 1 tablet by mouth daily.      . vitamin C (ASCORBIC ACID) 500 MG tablet Take 500 mg by mouth daily.      . Vitamins C E (CRANBERRY CONCENTRATE PO) Take by mouth daily.       No current facility-administered medications for this encounter.     REVIEW OF SYSTEMS: On review of systems, the patient reports that she is doing well overall. She denies any chest pain, shortness of breath, cough, fevers, chills, night sweats, unintended weight changes. She denies any bowel or bladder disturbances, and denies  abdominal pain, nausea or vomiting. She denies any new musculoskeletal or joint aches or pains. A complete review of systems is obtained and is otherwise negative.     PHYSICAL EXAM:   In general this is a well appearing Caucasian female in no acute distress. She is alert and oriented x4 and appropriate throughout the examination.   ECOG =  0  0 - Asymptomatic (Fully active, able to carry on all predisease activities without restriction)  1 - Symptomatic but completely ambulatory (Restricted in physically strenuous activity but ambulatory and able to carry out work of a light or sedentary nature. For example, light housework, office work)  2 - Symptomatic, <50% in bed during the day (Ambulatory and capable of all self care but unable to carry out any work activities. Up and about more than 50% of waking hours)  3 - Symptomatic, >50% in bed, but not bedbound (Capable of only limited self-care, confined to bed or chair 50% or more of waking hours)  4 - Bedbound (Completely disabled. Cannot carry on any self-care. Totally confined to bed or chair)  5 - Death   Eustace Pen MM, Creech RH, Tormey DC, et al. [phone]). "Toxicity and response criteria of the [facility]". [place] Oncol. 5 (6): 649-55   LABORATORY DATA:  Lab Results  Component Value Date   WBC 3.9 12/11/2015   HGB 13.0 12/11/2015   HCT 37.9 12/11/2015   MCV 87.5 12/11/2015   PLT 156 12/11/2015   Lab Results  Component Value Date   NA 141 12/11/2015   K 4.8 12/11/2015   CO2 28 12/11/2015   Lab Results  Component Value Date   ALT 15 12/11/2015   AST 19 12/11/2015   ALKPHOS 64 12/11/2015   BILITOT 0.56 12/11/2015      RADIOGRAPHY: Mr Breast Bilateral W Wo Contrast  12/12/2015  CLINICAL DATA:  60 year old female recently diagnosed with high-grade invasive ductal carcinoma and DCIS in the left breast. She also had a questionable left axillary lymph node biopsied which was negative for malignancy.  LABS:  Not applicable. EXAM: BILATERAL BREAST MRI WITH AND WITHOUT CONTRAST TECHNIQUE: Multiplanar, multisequence MR images of both breasts were obtained prior to and following the intravenous administration of 14 ml of MultiHance. THREE-DIMENSIONAL MR IMAGE RENDERING ON INDEPENDENT WORKSTATION: Three-dimensional MR images were rendered by post-processing of the original MR data on an independent workstation. The three-dimensional MR images were interpreted, and findings are reported in the following complete MRI report for this study. Three dimensional images were evaluated at the independent DynaCad workstation COMPARISON:  No prior MRI available for comparison. FINDINGS: Breast composition: c. Heterogeneous fibroglandular tissue. Background parenchymal enhancement: Mild. Right  breast: There is a 6 mm bilobed enhancing mass in the lower outer quadrant of the right breast, middle depth with suspicious washout kinetics (series 6, image 97). No other enhancing masses or suspicious areas of non mass enhancement are seen in the right breast. Left breast: Susceptibility artifact is seen in the slightly lateral left breast, middle depth corresponding with the biopsy marking clip of the stereotactic biopsy. There is extensive homogeneous non mass enhancement centrally in the left breast which extends the entire anterior to posterior dimension measuring 6 x 3 x 3 cm. There is question of asymmetric enhancement of the left nipple. No evidence of enhancement of the left pectoralis muscle. Lymph nodes: No abnormal appearing lymph nodes. Susceptibility artifact from the benign left axillary lymph node biopsy is identified. Ancillary findings:  None. IMPRESSION: 1. Abnormal extensive non mass enhancement centrally in the left breast spans at least 6 cm. There is concern for involvement of the left nipple as well. 2. There is a 6 mm enhancing mass in the lower outer quadrant of the right breast with suspicious kinetics.  RECOMMENDATION: MRI guided biopsy is recommended for the 6 mm enhancing mass in the right breast. BI-RADS CATEGORY  4: Suspicious. Electronically Signed   By: Ammie Ferrier M.D.   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   PLAN: Dr. Lisbeth Renshaw discusses the pathology findings and reviews the nature of invasive and situ disease. we discussed the interaction of surgical decisions and possible radiation treatment. The patient will proceed with an MRI scan to further delineate the extent of the patient's tumor. She has a 1.6 cm mass with an additional area of calcifications spanning 4 cm. We discussed that for the patient's age and health status, breast conservation treatment would include adjuvant radiation treatment. We also discussed that if she proceeded with a mastectomy, then based on the available information currently she would not require postmastectomy radiation treatment. She did have a lymph node biopsy which was negative and this was felt to be concordant. We did discuss that there are several factors including final tumor size and lymph node status as well as margin status which would be reviewed after surgery and it is possible that postmastectomy radiation treatment would be a recommendation although we do not anticipate this currently. She expressed an understanding of this issue. She also understands the recommendation to proceed with systemic treatment which has been discussed with her by medical oncology.  I will look forward to following the patient's case. If appropriate, I would be happy to see the patient postoperatively to further discuss postoperative radiation treatment as necessary.      Jodelle Gross, MD, PhD

## 2015-12-12 ENCOUNTER — Ambulatory Visit
Admission: RE | Admit: 2015-12-12 | Discharge: 2015-12-12 | Disposition: A | Discharge status: Home / Self Care | Payer: BC Managed Care – PPO | Source: Ambulatory Visit | Attending: Oncology | Admitting: Oncology

## 2015-12-12 ENCOUNTER — Encounter: Payer: Self-pay | Admitting: Radiation Oncology

## 2015-12-12 ENCOUNTER — Telehealth: Payer: Self-pay | Admitting: *Deleted

## 2015-12-12 DIAGNOSIS — C50412 Malignant neoplasm of upper-outer quadrant of left female breast: Secondary | ICD-10-CM

## 2015-12-12 MED ORDER — GADOBENATE DIMEGLUMINE 529 MG/ML IV SOLN
14.0000 mL | Freq: Once | INTRAVENOUS | Status: AC | PRN
  Administered 2015-12-12: 14 mL via INTRAVENOUS

## 2015-12-12 NOTE — Telephone Encounter (Signed)
Spoke to pt concerning [place] from 12/11/15. Denies questions or concerns regarding dx or treatment care plan Encourage pt to call with needs. Received verbal understanding.

## 2015-12-14 ENCOUNTER — Telehealth: Payer: Self-pay | Admitting: Oncology

## 2015-12-14 NOTE — Telephone Encounter (Signed)
S/w pt, gave appt for 7/25 @ 9.30am chemo class.

## 2015-12-16 ENCOUNTER — Ambulatory Visit (HOSPITAL_COMMUNITY)
Admission: RE | Admit: 2015-12-16 | Discharge: 2015-12-16 | Disposition: A | Discharge status: Home / Self Care | Payer: BC Managed Care – PPO | Source: Ambulatory Visit | Attending: Oncology | Admitting: Oncology

## 2015-12-16 DIAGNOSIS — Z01810 Encounter for preprocedural cardiovascular examination: Secondary | ICD-10-CM | POA: Insufficient documentation

## 2015-12-16 DIAGNOSIS — C50412 Malignant neoplasm of upper-outer quadrant of left female breast: Secondary | ICD-10-CM | POA: Insufficient documentation

## 2015-12-16 DIAGNOSIS — I340 Nonrheumatic mitral (valve) insufficiency: Secondary | ICD-10-CM | POA: Diagnosis not present

## 2015-12-16 DIAGNOSIS — I517 Cardiomegaly: Secondary | ICD-10-CM | POA: Diagnosis not present

## 2015-12-16 NOTE — Progress Notes (Signed)
  Echocardiogram 2D Echocardiogram has been performed.  Alexandra Mathis 12/16/2015, 10:12 AM

## 2015-12-16 NOTE — Addendum Note (Signed)
Encounter addended by: Hayden Pedro, PA-C on: 12/16/2015  1:08 PM<BR>    Actions taken: Sign clinical note

## 2015-12-17 ENCOUNTER — Encounter: Payer: Self-pay | Admitting: *Deleted

## 2015-12-17 ENCOUNTER — Other Ambulatory Visit: Payer: BC Managed Care – PPO

## 2015-12-17 ENCOUNTER — Telehealth: Payer: Self-pay | Admitting: Oncology

## 2015-12-17 ENCOUNTER — Encounter (HOSPITAL_COMMUNITY): Payer: Self-pay | Admitting: *Deleted

## 2015-12-17 NOTE — Telephone Encounter (Signed)
Pt came in to move 7/28 apt, claimed she was never notified about sched change, fixed sched & gave pt cal & avs

## 2015-12-18 ENCOUNTER — Encounter: Payer: Self-pay | Admitting: Oncology

## 2015-12-18 ENCOUNTER — Other Ambulatory Visit: Payer: Self-pay | Admitting: Oncology

## 2015-12-18 NOTE — Progress Notes (Unsigned)
Called patient to introduce myself as Estate manager/land agent and to ask questions if she had any financial questions or concerns. Patient had questions about how much this may cost her. Asked patient if she has a 80/20 plan. She states no she has 70/30. Asked patient if she knows if she has met her deductible/OOP for her insurance. Patient wasn't sure. Advised patient she may want to contact her insurance so that she may be given specific details regarding her plan and amounts. Patient states she would. Discussed [employer] grant details with patient and what to bring to appointment to apply. Asked patient if she would like to meet on Fri 7/28 after her appointment with Dr.Magrinat. Patient states yes. Advised patient to let the person at the front desk know when she is done with her appointment so that she may be directed to me. At that appointment we will go over [employer], insurance, and other financial assistance if needed. Patient has my name and number for any additional questions or concerns.

## 2015-12-19 ENCOUNTER — Encounter (HOSPITAL_COMMUNITY): Payer: Self-pay | Admitting: *Deleted

## 2015-12-19 ENCOUNTER — Other Ambulatory Visit: Payer: Self-pay | Admitting: General Surgery

## 2015-12-19 DIAGNOSIS — R928 Other abnormal and inconclusive findings on diagnostic imaging of breast: Secondary | ICD-10-CM

## 2015-12-20 ENCOUNTER — Encounter: Payer: Self-pay | Admitting: Oncology

## 2015-12-20 ENCOUNTER — Other Ambulatory Visit (HOSPITAL_BASED_OUTPATIENT_CLINIC_OR_DEPARTMENT_OTHER): Payer: BC Managed Care – PPO

## 2015-12-20 ENCOUNTER — Other Ambulatory Visit: Payer: BC Managed Care – PPO

## 2015-12-20 ENCOUNTER — Telehealth: Payer: Self-pay | Admitting: Oncology

## 2015-12-20 ENCOUNTER — Encounter: Payer: Self-pay | Admitting: Plastic Surgery

## 2015-12-20 ENCOUNTER — Ambulatory Visit (HOSPITAL_BASED_OUTPATIENT_CLINIC_OR_DEPARTMENT_OTHER): Payer: BC Managed Care – PPO | Admitting: Oncology

## 2015-12-20 ENCOUNTER — Ambulatory Visit: Payer: BC Managed Care – PPO | Admitting: Oncology

## 2015-12-20 VITALS — BP 132/76 | HR 73 | Temp 98.2°F | Resp 18 | Ht 67.5 in | Wt 152.7 lb

## 2015-12-20 DIAGNOSIS — C50412 Malignant neoplasm of upper-outer quadrant of left female breast: Secondary | ICD-10-CM

## 2015-12-20 DIAGNOSIS — R51 Headache: Secondary | ICD-10-CM | POA: Diagnosis not present

## 2015-12-20 LAB — COMPREHENSIVE METABOLIC PANEL
ALK PHOS: 64 U/L (ref 40–150)
ALT: 20 U/L (ref 0–55)
AST: 22 U/L (ref 5–34)
Albumin: 4 g/dL (ref 3.5–5.0)
Anion Gap: 7 mEq/L (ref 3–11)
BUN: 16.8 mg/dL (ref 7.0–26.0)
CHLORIDE: 103 meq/L (ref 98–109)
CO2: 29 meq/L (ref 22–29)
Calcium: 9.4 mg/dL (ref 8.4–10.4)
Creatinine: 0.8 mg/dL (ref 0.6–1.1)
EGFR: 82 mL/min/{1.73_m2} — AB (ref >90)
GLUCOSE: 63 mg/dL — AB (ref 70–140)
POTASSIUM: 4.3 meq/L (ref 3.5–5.1)
SODIUM: 139 meq/L (ref 136–145)
Total Bilirubin: 0.45 mg/dL (ref 0.20–1.20)
Total Protein: 7.5 g/dL (ref 6.4–8.3)

## 2015-12-20 LAB — CBC WITH DIFFERENTIAL/PLATELET
BASO%: 0.7 % (ref 0.0–2.0)
BASOS ABS: 0 10*3/uL (ref 0.0–0.1)
EOS ABS: 0 10*3/uL (ref 0.0–0.5)
EOS%: 0.8 % (ref 0.0–7.0)
HCT: 38.3 % (ref 34.8–46.6)
HGB: 12.8 g/dL (ref 11.6–15.9)
LYMPH%: 34.3 % (ref 14.0–49.7)
MCH: 29.5 pg (ref 25.1–34.0)
MCHC: 33.4 g/dL (ref 31.5–36.0)
MCV: 88.4 fL (ref 79.5–101.0)
MONO#: 0.5 10*3/uL (ref 0.1–0.9)
MONO%: 9.1 % (ref 0.0–14.0)
NEUT#: 2.8 10*3/uL (ref 1.5–6.5)
NEUT%: 55.1 % (ref 38.4–76.8)
Platelets: 177 10*3/uL (ref 145–400)
RBC: 4.33 10*6/uL (ref 3.70–5.45)
RDW: 13.4 % (ref 11.2–14.5)
WBC: 5 10*3/uL (ref 3.9–10.3)
lymph#: 1.7 10*3/uL (ref 0.9–3.3)

## 2015-12-20 MED ORDER — LIDOCAINE-PRILOCAINE 2.5-2.5 % EX CREA: TOPICAL_CREAM | CUTANEOUS | 3 refills | Status: DC

## 2015-12-20 MED ORDER — PROCHLORPERAZINE MALEATE 10 MG PO TABS: 10.0000 mg | ORAL_TABLET | Freq: Four times a day (QID) | ORAL | 1 refills | Status: DC | PRN

## 2015-12-20 MED ORDER — ONDANSETRON HCL 8 MG PO TABS: 8.0000 mg | ORAL_TABLET | Freq: Two times a day (BID) | ORAL | 1 refills | Status: DC | PRN

## 2015-12-20 NOTE — Telephone Encounter (Signed)
appt made and avs printed °

## 2015-12-20 NOTE — Progress Notes (Unsigned)
Patient and spouse came in office. I introduced myself as Estate manager/land agent. Patient brought proof of income for household for [employer]. Patient approved for one-time $1000 grant. Patient has a copy of award letter as well as expenses it covers along with our outpatient pharmacy information. Patient also brought her insurance information which shows she has not yet met her OOP but has met her deductible. Patient would like to apply for possible copay assistance. I will be applying to [facility] on patient's behalf for [place] and [place] and will advise her of decision if available when she returns next week.  Patient has my card for any additional financial questions or concerns.

## 2015-12-20 NOTE — Progress Notes (Signed)
[place]  [phone] [phone]     ID: TERYL GUBLER DOB: September 13, 1955  MR#: [id number]  KHT#:[id number]  Patient Care Team: Darrol Jump, PA-C as PCP - General (Family Medicine) Fanny Skates, MD as Consulting Physician (General Surgery) Chauncey Cruel, MD as Consulting Physician (Oncology) Kyung Rudd, MD as Consulting Physician (Radiation Oncology) Larey Dresser, MD as Consulting Physician (Cardiology) OTHER MD:  CHIEF COMPLAINT: Triple positive breast cancer  CURRENT TREATMENT: Paclitaxel, trastuzumab, and pertuzumab   BREAST CANCER HISTORY: From the original intake note:  "Alexandra Mathis" had screening mammography in June 2017 showing upper outer quadrant calcifications. Accordingly she was referred for left diagnostic mammography and ultrasonography, performed 11/27/2015. The breast density was category C. Compression views of the left breast found pleomorphic calcifications in the upper outer quadrant measuring up to 4.0 cm. There was an area of associated architectural distortion in the upper outer quadrant as well. On physical exam there was an irregular thickening in the left breast at the 2:00 position anteriorly. Ultrasonography confirmed a hypoechoic irregular mass measuring 1.6 cm in the 2:00 position 2 cm from the nipple. An intramammary lymph node was noted with benign appearing. In the left axilla there was a lymph node with focally thickened cortex.  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 Biopsy of the left axillary lymph node was negative.  Her subsequent history is as detailed below  INTERVAL HISTORY: Alexandra Mathis returns  today in preparation for the start of neoadjuvant chemotherapy accompanied by her husband Alexandra Mathis. Since her last visit here she had her breast MRI (12/12/2015).. There was very extensive non-mass like enhancement throughout the entire AP dimension of the left breast,, maximal distance of 6 cm. The pectoralis muscle did not enhance. On the right there was an unsuspected 0.6 cm mass in the lower outer quadrant, which warrants further evaluation. She is scheduled for MRI guided biopsy of this mass 12/26/2015. Chemotherapy is scheduled for the next day  REVIEW OF SYSTEMS: Alexandra Mathis has some mild back pain which is not more intense or persistent than before. She has been speaking to her many relatives with cancer and also relatives and in-laws who are nurse practitioners and advanced practice providers. She had many questions related to her reading as well. Otherwise a detailed review of systems today was noncontributory  PAST MEDICAL HISTORY: Past Medical History:  Diagnosis Date  . Breast cancer ([place])   . Breast cancer of upper-outer quadrant of left female breast ([place]) 12/05/2015  . Chest pain   . Family history of premature CAD   . HLD (hyperlipidemia)     PAST SURGICAL HISTORY: Past Surgical History:  Procedure Laterality Date  . ANTERIOR CRUCIATE LIGAMENT REPAIR     right knee  . CESAREAN SECTION     x4  . GANGLION CYST EXCISION    . LAPAROSCOPIC ENDOMETRIOSIS FULGURATION    . PARTIAL HYSTERECTOMY    . TONSILLECTOMY    . WISDOM TOOTH EXTRACTION      FAMILY HISTORY Family History  Problem Relation Age of Onset  . Other Mother     [place] Meningioma  . Colon cancer Father   . Cancer    . Heart attack    .  Ovarian cancer Sister   . Breast cancer Sister   . Other Paternal Aunt     Peritoneal cancer  . Cancer Paternal Uncle   . Ovarian cancer Sister   . Breast cancer Sister   . Breast cancer Cousin   . Ovarian cancer Cousin   . Ovarian cancer Cousin   . Breast cancer Cousin   .  Breast cancer Cousin     GYNECOLOGIC HISTORY:  No LMP recorded. Patient is postmenopausal. Menarche age 53, first live birth age 38. The patient is GX P4. She stopped having periods in 1989. She did not use hormone replacement. She used oral contraceptives less than one year.  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 Her husband Alexandra Mathis is retired from a [employer]. Son Alexandra Mathis lives in [name] and works as a Chief Strategy Officer. Son Alexandra Mathis lives in [place] and is with the [employer]. Son Alexandra Mathis this and cocoa [place] where he works as an Chief Financial Officer. Son Alexandra Mathis lives in [place] were he works as an Chief Financial Officer. The patient has 4 grandchildren including a 27-year-old they have custody of and lives with them. The patient attends our [employer]   HEALTH MAINTENANCE: Social History  Substance Use Topics  . Smoking status: Former Smoker    Years: 0.80    Types: Cigarettes    Quit date: 05/25/1974  . Smokeless tobacco: Never Used  . Alcohol use 0.5 oz/week    1 Standard drinks or equivalent per week     Colonoscopy:  PAP:  Bone density:   Allergies  Allergen Reactions  . Morphine And Related   . Vicodin [Hydrocodone-Acetaminophen]     Current Outpatient Prescriptions  Medication Sig Dispense Refill  . Probiotic Product (ALIGN PO) Take by mouth.    Marland Kitchen atorvastatin (LIPITOR) 40 MG tablet Take 40 mg by mouth daily.  5  . etodolac (LODINE) 400 MG tablet Take 400 mg by mouth 2 (two) times daily as needed for mild pain.   2  . FIBER COMPLETE PO Take by mouth daily.      Marland Kitchen lidocaine-prilocaine (EMLA) cream Apply to affected area once 30 g 3  . Multiple Vitamin (MULTIVITAMIN) tablet Take 1 tablet by mouth daily.      . ondansetron (ZOFRAN) 8 MG tablet Take 1 tablet (8 mg total) by mouth 2 (two) times daily as needed (Nausea or vomiting). 30 tablet 1  . prochlorperazine (COMPAZINE) 10 MG tablet  Take 1 tablet (10 mg total) by mouth every 6 (six) hours as needed (Nausea or vomiting). 30 tablet 1  . Vitamins C E (CRANBERRY CONCENTRATE PO) Take by mouth daily.       No current facility-administered medications for this visit.     OBJECTIVE: Middle-aged white womanWho appears well Vitals:   12/20/15 1114  BP: 132/76  Pulse: 73  Resp: 18  Temp: 98.2 F (36.8 C)     Body mass index is 23.56 kg/m.    ECOG FS:0 - Asymptomatic  Sclerae unicteric, pupils round and equal Oropharynx clear and moist-- no thrush or other lesions No cervical or supraclavicular adenopathy Lungs no rales or rhonchi Heart regular rate and rhythm Abd soft, nontender, positive bowel sounds MSK no focal spinal tenderness, no upper extremity lymphedema Neuro: nonfocal, well oriented, appropriate affect Breasts: The right breast is unremarkable. The left breast is status post biopsy and there is a residual ecchymosis. There is  a palpable mass which may be hematoma when the left upper outer quadrant is approached laterally. The left axilla is benign.    LAB RESULTS:  CMP     Component Value Date/Time   NA 139 12/20/2015 1103   K 4.3 12/20/2015 1103   CO2 29 12/20/2015 1103   GLUCOSE 63 (L) 12/20/2015 1103   BUN 16.8 12/20/2015 1103   CREATININE 0.8 12/20/2015 1103   CALCIUM 9.4 12/20/2015 1103   PROT 7.5 12/20/2015 1103   ALBUMIN 4.0 12/20/2015 1103   AST 22 12/20/2015 1103   ALT 20 12/20/2015 1103   ALKPHOS 64 12/20/2015 1103   BILITOT 0.45 12/20/2015 1103    INo results found for: SPEP, UPEP  Lab Results  Component Value Date   WBC 5.0 12/20/2015   NEUTROABS 2.8 12/20/2015   HGB 12.8 12/20/2015   HCT 38.3 12/20/2015   MCV 88.4 12/20/2015   PLT 177 12/20/2015      Chemistry      Component Value Date/Time   NA 139 12/20/2015 1103   K 4.3 12/20/2015 1103   CO2 29 12/20/2015 1103   BUN 16.8 12/20/2015 1103   CREATININE 0.8 12/20/2015 1103      Component Value Date/Time   CALCIUM  9.4 12/20/2015 1103   ALKPHOS 64 12/20/2015 1103   AST 22 12/20/2015 1103   ALT 20 12/20/2015 1103   BILITOT 0.45 12/20/2015 1103       No results found for: LABCA2  No components found for: LABCA125  No results for input(s): INR in the last 168 hours.  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 She also had a questionable left axillary lymph node biopsied which was negative for malignancy. LABS:  Not applicable. EXAM: BILATERAL BREAST MRI WITH AND WITHOUT CONTRAST TECHNIQUE: Multiplanar, multisequence MR images of both breasts were obtained prior to and following the intravenous administration of 14 ml of MultiHance. THREE-DIMENSIONAL MR IMAGE RENDERING ON INDEPENDENT WORKSTATION: Three-dimensional MR images were rendered by post-processing of the original MR data on an independent workstation. The three-dimensional MR images were interpreted, and findings are reported in the following complete MRI report for this study. Three dimensional images were evaluated at the independent DynaCad workstation COMPARISON:  No prior MRI available for comparison. FINDINGS: Breast composition: c. Heterogeneous fibroglandular tissue. Background parenchymal enhancement: Mild. Right breast: There is a 6 mm bilobed enhancing mass in the lower outer quadrant of the right breast, middle depth with suspicious washout kinetics (series 6, image 97). No other enhancing masses or suspicious areas of non mass enhancement are seen in the right breast. Left breast: Susceptibility artifact is seen in the slightly lateral left breast, middle depth corresponding with the biopsy marking clip of the stereotactic  biopsy. There is extensive homogeneous non mass enhancement centrally in the left breast which extends the entire anterior to posterior dimension measuring 6 x 3 x 3 cm. There is question of asymmetric enhancement of the left nipple. No evidence of enhancement of the left pectoralis muscle. Lymph nodes: No abnormal appearing lymph nodes. Susceptibility artifact from the benign left axillary lymph node biopsy is identified. Ancillary findings:  None. IMPRESSION: 1. Abnormal extensive non mass enhancement centrally in the left breast spans at least 6 cm. There is concern  for involvement of the left nipple as well. 2. There is a 6 mm enhancing mass in the lower outer quadrant of the right breast with suspicious kinetics. RECOMMENDATION: MRI guided biopsy is recommended for the 6 mm enhancing mass in the right breast. BI-RADS CATEGORY  4: Suspicious. Electronically Signed   By: Ammie Ferrier M.D.   On: 12/12/2015 16:14     ELIGIBLE FOR AVAILABLE RESEARCH PROTOCOL: no  ASSESSMENT: 60 y.o. [place], [place] woman status post left breast upper outer quadrant biopsy 12/02/2015 for a clinical T1c pN0, stage IA  invasive ductal carcinoma, grade 3, estrogen and progesterone receptor positive, with an MIB-1 of 15%, and HER-2 amplification  (a) biopsy of a 0.6 cm lower outer quadrant right breast mass scheduled for 12/26/2015  (1) neoadjuvant chemotherapy will consist of weekly paclitaxel 12 with trastuzumab and pertuzumab every 21 days 4 starting 12/27/2015  (2) trastuzumab to be continued to total of one year  (a) echo 12/16/2015 showed an ejection fraction in the 55-60% range.  (3) definitive surgery to follow chemotherapy  (4) adjuvant radiation to follow as appropriate  (5) anti-estrogens to follow at the completion of local treatment  (6) genetics testing scheduled for 01/09/2016     PLAN: We spent a little over 40 minutes today going over her situation in detail. The non-masslike  enhancement in the left breast may not eat all invasive disease and if so it is unlikely to shrink. This may compromise the possibility of lumpectomy. On the right we do need to biopsy the small mass, as that would impact future surgical decisions. Accordingly a right breast MRI guided biopsy is ordered.  Jerrye Beavers had not had mammography for some years because of family issues which were discussed today. Largely those have been resolved. She wanted to know if her tumor was HER-2 positive because of that delay. The answer to that is no. She understands the reason all the breast cancers in her family have been left-sided and the reason her tumor habits to be HER-2 positive is chance.  We again reviewed her prognosis which is excellent since she has triple positive disease.  We then reviewed the possible toxicities, side effects and complications of paclitaxel and the anti-HER-2 treatment. Her echo earlier this week shows an excellent ejection fraction. She knows we will repeat that every 3 months until she completes her year on trastuzumab.  I wrote her for both ondansetron and prochlorperazine for nausea. I suggested she try the ondansetron as most people tolerate it better although it can cause mild headaches. She will take it the evening of and the morning after her chemotherapy, and then as needed.  I suggested she keep a diary so that we may intervene in future cycles with any symptoms that she may develop with the first one.  Otherwise she is already scheduled to start her chemotherapy next week. She will have her genetics testing later this month and she will have those results available before she undergoes her definitive surgery  She knows to call for any problems that may develop before her next visit here     :Chauncey Cruel, MD   12/20/2015 6:17 PM Medical Oncology and Hematology [facility] [place],  [address] Tel. [phone]    Fax.  [phone]

## 2015-12-23 ENCOUNTER — Encounter: Payer: Self-pay | Admitting: Oncology

## 2015-12-23 ENCOUNTER — Other Ambulatory Visit (HOSPITAL_COMMUNITY): Payer: BC Managed Care – PPO

## 2015-12-23 NOTE — Progress Notes (Signed)
form left in pod 12/20/15

## 2015-12-24 ENCOUNTER — Encounter: Payer: Self-pay | Admitting: Oncology

## 2015-12-24 NOTE — H&P (Signed)
Alexandra Mathis  Location: [facility] Patient #: [id number] DOB: June 02, 1955 Undefined / Language: Cleophus Molt / Race: White Female      History of Present Illness       This is a very pleasant 60 year old Caucasian female, referred by Dr. 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 Her primary care provider is Benjamine Mola, [place]. This is an [facility]. She is being seen in the [facility] today by Dr. Jana Hakim, Dr. Lisbeth Renshaw, and me. We discussed her case in breast conference this morning.  She has no prior history of breast disease. Last mammogram 2012. She went for screening mammogram, she was asymptomatic. The mammogram shows a 4 cm area of microcalcifications in her ultrasound there was a 1.8 cm solid mass in the middle of this. This is a fairly large area considering the size of her breast and almost fills the upper outer quadrant. 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 A biopsy of the left axilla was negative for a borderline lymph node. Right breast looks normal. She has developed a hematoma in the left breast. Dr. Purcell Nails is concerned that the microcalcifications may represent DCIS and if lumpectomy is considered these areas certainly need to be biopsied to determine the extent of disease. The patient states she has done a lot of research, and is really not interested lumpectomy and wants a mastectomy.  Past history is significant for 4 C-sections and abdominal hysterectomy without BSO. Hyperlipidemia. She is due for colonoscopy which she gets every 5 years because of family history. She is sensitive to epinephrine and to morphine. Family history reveals sister diagnosed with breast cancer at age 39. Also had ovarian cancer. A survivor. 2 paternal first cousins had breast  cancer. Father had colon cancer.  She is married and lives with her husband in [place]. They have 4 children. He is with her throughout the encounter today. Denies tobacco since college. 3 alcoholic beverages a week. She teaches high school biology.  We had a long discussion about breast cancer management in general and in her case specifically. We have decided to give 3 months of neoadjuvant chemotherapy, followed by definitive surgery, and then complete a full year of Herceptin. She will be scheduled for MRI She will be referred for genetic counseling and genetic testing. She is not interested in lumpectomy and radiation therapy. She prefers left mastectomy, sentinel lymph node biopsy and immediate reconstruction. She is contemplating bilateral mastectomy. I told her we should refer her to plastic surgery to discuss plastic surgical issues during her neoadjuvant chemotherapy. And we agreed that she would see Dr. Iran Planas sometime in the next few weeks. Perhaps after her genetic testing is back, , but really whenever she would like. I will instruct my office to set this up. She'll be scheduled for Port-A-Cath insertion with ultrasound. She is familiar with this because her father had one. I discussed the indications, details, techniques, and numerous risk of Port-A-Cath insertion with her and her husband. They're aware of the risk of bleeding, infection, occasional necessity of bilateral attempts, pneumothorax requiring hospitalization, air embolus, malfunction down the road requiring revision, and other unforeseen problems. I told her we would most likely attempt the right side first. Could be either side. She understands all of these issues and all of her questions are answered. She agrees with  this plan.     Addendum Note Her MRI shows abnormal extensive non-mass enhancement centrally in the left breast spanning at least 6 cm with concern  for involvement of the left nipple as well.  In addition, there is a new finding which is a 6 mm enhancing mass in the lower outer quadrant of the right breast with suspicious kinetics. This is the contralateral breast.  I called her and talked to her and her husband. She has pretty much decided to have bilateral mastectomies and is seeing Dr. Iran Planas on July 31. She wants to know why she needs a biopsy on the right breast if we're going to do a mastectomy regardless. She had a lot of concerns and needed to go over all the issues. I told her that it would be best to find out female whether she had cancer in the right breast because it would help [place] decide how to manage the right axilla. I told her that I would not mandate that this be done but that it was the standard of care to completely define the extent of her disease prior to neoadjuvant chemotherapy. After lengthy discussion about the disease situation in both breast she said it would be fine to go ahead with the MRI guided biopsy  We will set that up and coordinate with her schedule. She has appointment with Dr. Jana Hakim on July 28. Dr. Iran Planas on July 31. Port-A-Cath on August 2. First chemotherapy on August 4. We can get the biopsy either immediately before or immediately after the Port-A-Cath.   Other Problems Arthritis Back Pain General anesthesia - complications Hemorrhoids Hypercholesterolemia Lump In Breast  Past Surgical History  Breast Biopsy Left. Cesarean Section - Multiple Hysterectomy (not due to cancer) - Partial Oral Surgery Tonsillectomy  Diagnostic Studies History  Colonoscopy 1-5 years ago Mammogram within last year  Medication History  Medications Reconciled  Social History Alcohol use Occasional alcohol use. Caffeine use Carbonated beverages, Coffee. Illicit drug use Remotely quit drug use. Tobacco use Former smoker.  Family History  Arthritis Father, Sister. Breast  Cancer Sister. Colon Cancer Father. Heart Disease Brother, Family Members In General, Mother, Sister. Heart disease in female family member before age 21 Heart disease in female family member before age 11 Hypertension Brother. Seizure disorder Mother. Thyroid problems Mother.  Pregnancy / Birth History  Age at menarche 70 years. Age of menopause <44 Contraceptive History Intrauterine device, Oral contraceptives. Gravida 8 Length (months) of breastfeeding 7-12 Maternal age 62-25 Para 4    Review of Systems  General Not Present- Appetite Loss, Chills, Fatigue, Fever, Night Sweats, Weight Gain and Weight Loss. Skin Not Present- Change in Wart/Mole, Dryness, Hives, Jaundice, New Lesions, Non-Healing Wounds, Rash and Ulcer. HEENT Present- Seasonal Allergies and Wears glasses/contact lenses. Not Present- Earache, Hearing Loss, Hoarseness, Nose Bleed, Oral Ulcers, Ringing in the Ears, Sinus Pain, Sore Throat, Visual Disturbances and Yellow Eyes. Respiratory Present- Snoring. Not Present- Bloody sputum, Chronic Cough, Difficulty Breathing and Wheezing. Breast Present- Breast Mass. Not Present- Breast Pain, Nipple Discharge and Skin Changes. Gastrointestinal Not Present- Abdominal Pain, Bloating, Bloody Stool, Change in Bowel Habits, Chronic diarrhea, Constipation, Difficulty Swallowing, Excessive gas, Gets full quickly at meals, Hemorrhoids, Indigestion, Nausea, Rectal Pain and Vomiting. Female Genitourinary Not Present- Frequency, Nocturia, Painful Urination, Pelvic Pain and Urgency. Musculoskeletal Present- Back Pain. Not Present- Joint Pain, Joint Stiffness, Muscle Pain, Muscle Weakness and Swelling of Extremities. Neurological Not Present- Decreased Memory, Fainting, Headaches, Numbness, Seizures, Tingling, Tremor, Trouble walking and Weakness.  Psychiatric Not Present- Anxiety, Bipolar, Change in Sleep Pattern, Depression, Fearful and Frequent crying. Endocrine Not Present-  Cold Intolerance, Excessive Hunger, Hair Changes, Heat Intolerance, Hot flashes and New Diabetes. Hematology Not Present- Blood Thinners, Easy Bruising, Excessive bleeding, Gland problems, HIV and Persistent Infections.   Physical Exam  General Mental Status-Alert. General Appearance-Consistent with stated age. Hydration-Well hydrated. Voice-Normal. Note: Healthy-appearing woman in no distress. Mental status completely normal. Husband with her throughout the encounter. Body habitus normal, thin. BMI not calculated.   Head and Neck Head-normocephalic, atraumatic with no lesions or palpable masses. Trachea-midline. Thyroid Gland Characteristics - normal size and consistency.  Eye Eyeball - Bilateral-Extraocular movements intact. Sclera/Conjunctiva - Bilateral-No scleral icterus.  Chest and Lung Exam Chest and lung exam reveals -quiet, even and easy respiratory effort with no use of accessory muscles and on auscultation, normal breath sounds, no adventitious sounds and normal vocal resonance. Inspection Chest Wall - Normal. Back - normal.  Breast Note: Breast are not large, medium size. 3-4 cm hematoma left breast, upper outer quadrant with some localized ecchymoses. Otherwise both breasts are soft without any skin changes or axillary adenopathy. Nipple and areolar complexes look normal.   Cardiovascular Cardiovascular examination reveals -normal heart sounds, regular rate and rhythm with no murmurs and normal pedal pulses bilaterally.  Abdomen Inspection Inspection of the abdomen reveals - No Hernias. Palpation/Percussion Palpation and Percussion of the abdomen reveal - Soft, Non Tender, No Rebound tenderness, No Rigidity (guarding) and No hepatosplenomegaly. Auscultation Auscultation of the abdomen reveals - Bowel sounds normal. Note: Well healed Pfannenstiel incision without incisional hernia   Neurologic Neurologic evaluation reveals -alert  and oriented x 3 with no impairment of recent or remote memory. Mental Status-Normal.  Musculoskeletal Normal Exam - Left-Upper Extremity Strength Normal and Lower Extremity Strength Normal. Normal Exam - Right-Upper Extremity Strength Normal and Lower Extremity Strength Normal.  Lymphatic Head & Neck  General Head & Neck Lymphatics: Bilateral - Description - Normal. Axillary  General Axillary Region: Bilateral - Description - Normal. Tenderness - Non Tender. Femoral & Inguinal  Generalized Femoral & Inguinal Lymphatics: Bilateral - Description - Normal. Tenderness - Non Tender.    Assessment & Plan  PRIMARY CANCER OF UPPER OUTER QUADRANT OF LEFT FEMALE BREAST (C50.412)        Your recent imaging studies and image guided biopsies show a high-grade invasive ductal carcinoma in the left breast, upper outer quadrant. The solid tumor is 1.8 cm in size but there is a larger area of microcalcifications that are suspicious for in situ cancer. At least 4 cm in diameter. It is not proven that this is in situ cancer. This entire area should be excised. The tumor is positive for estrogen and progesterone receptor, and is also positive for HER-2.       We have discussed options for therapy. We have discussed mastectomy and sentinel lymph node biopsy with or without reconstruction. We have discussed whether or not lumpectomy is feasible. This will require further biopsies to define the extent of tumor. You have stated that, due to the extent of tumor, you are committed to total mastectomy on the left, and that you are contemplating whether or not to do a prophylactic mastectomy on the right. Therefore we do not need to do any further biopsies.       We have decided that the first step in your care is chemotherapy, and you will be scheduled for Port-A-Cath insertion by Dr. Dalbert Batman. I have discussed the indications, techniques and  numerous risk of Port-A-Cath insertion. You are familiar with this  because your father had a port. Dr. Darrel Hoover office will call you tomorrow to begin the scheduling process and we will get this done by August 4, which is your chemotherapy start date.  The cancer center nursing staff will refer you for genetic counseling and genetic testing. The cancer center nursing staff will refer you for pre-treatment bilateral breast MRI. Dr. Darrel Hoover office will refer you to plastic surgery, Dr. Iran Planas, sometime in August.  Dr. Dalbert Batman will see you back in about 6 weeks into your chemotherapy to see how you're doing and to discuss further surgical treatment planning issues.  Please read the printed information that we have given you.  HISTORY OF ABDOMINAL HYSTERECTOMY (Z90.710) Impression: Ovaries were not removed HISTORY OF 3 CESAREAN SECTIONS (Z87.59) FAMILY HISTORY OF BREAST CANCER (Z80.3) Impression: Sister diagnosed age 78, survivor. Also had ovarian cancer. 2 paternal first cousins with breast cancer. Survivors HYPERLIPIDEMIA, MILD (E78.5) BREAST HEMATOMA (N64.89) Impression: 3-4 cm diameter, upper outer quadrant. Stable. Post biopsy.   [name]. Dalbert Batman, M.D., [facility] General and Minimally invasive Surgery Breast and Colorectal Surgery Office:   [phone] Pager:   [phone]

## 2015-12-24 NOTE — Anesthesia Preprocedure Evaluation (Signed)
Anesthesia Evaluation  Patient identified by MRN, date of birth, ID band Patient awake    Reviewed: Allergy & Precautions, H&P , NPO status , Patient's Chart, lab work & pertinent test results  Airway Mallampati: II  TM Distance: >3 FB Neck ROM: full    Dental no notable dental hx. (+) Dental Advisory Given, Teeth Intact   Pulmonary neg pulmonary ROS, former smoker,    Pulmonary exam normal breath sounds clear to auscultation       Cardiovascular Exercise Tolerance: Good Normal cardiovascular exam Rhythm:regular Rate:Normal  Good EF. Grade 1 diastolic dysfunction. Strong family history of premature CAD and she has had CP   Neuro/Psych negative neurological ROS  negative psych ROS   GI/Hepatic negative GI ROS, Neg liver ROS,   Endo/Other  negative endocrine ROS  Renal/GU negative Renal ROS  negative genitourinary   Musculoskeletal   Abdominal   Peds  Hematology negative hematology ROS (+)   Anesthesia Other Findings   Reproductive/Obstetrics negative OB ROS                             Anesthesia Physical Anesthesia Plan  ASA: III  Anesthesia Plan: General   Post-op Pain Management:    Induction: Intravenous  Airway Management Planned: LMA  Additional Equipment:   Intra-op Plan:   Post-operative Plan:   Informed Consent: I have reviewed the patients History and Physical, chart, labs and discussed the procedure including the risks, benefits and alternatives for the proposed anesthesia with the patient or authorized representative who has indicated his/her understanding and acceptance.   Dental Advisory Given  Plan Discussed with: CRNA  Anesthesia Plan Comments:         Anesthesia Quick Evaluation

## 2015-12-24 NOTE — Progress Notes (Signed)
form left in pod 12/20/15- lef for dr Jana Hakim to sign

## 2015-12-25 ENCOUNTER — Ambulatory Visit (HOSPITAL_COMMUNITY): Payer: BC Managed Care – PPO

## 2015-12-25 ENCOUNTER — Encounter (HOSPITAL_COMMUNITY)
Admission: RE | Disposition: A | Discharge status: Home / Self Care | Payer: Self-pay | Source: Ambulatory Visit | Attending: General Surgery

## 2015-12-25 ENCOUNTER — Encounter (HOSPITAL_COMMUNITY): Payer: Self-pay | Admitting: Certified Registered Nurse Anesthetist

## 2015-12-25 ENCOUNTER — Encounter: Payer: Self-pay | Admitting: Oncology

## 2015-12-25 ENCOUNTER — Ambulatory Visit (HOSPITAL_COMMUNITY): Payer: BC Managed Care – PPO | Admitting: Anesthesiology

## 2015-12-25 ENCOUNTER — Ambulatory Visit (HOSPITAL_COMMUNITY)
Admission: RE | Admit: 2015-12-25 | Discharge: 2015-12-25 | Disposition: A | Discharge status: Home / Self Care | Payer: BC Managed Care – PPO | Source: Ambulatory Visit | Attending: General Surgery | Admitting: General Surgery

## 2015-12-25 DIAGNOSIS — Z87891 Personal history of nicotine dependence: Secondary | ICD-10-CM | POA: Diagnosis not present

## 2015-12-25 DIAGNOSIS — Z95828 Presence of other vascular implants and grafts: Secondary | ICD-10-CM

## 2015-12-25 DIAGNOSIS — Z8543 Personal history of malignant neoplasm of ovary: Secondary | ICD-10-CM | POA: Insufficient documentation

## 2015-12-25 DIAGNOSIS — E785 Hyperlipidemia, unspecified: Secondary | ICD-10-CM | POA: Diagnosis not present

## 2015-12-25 DIAGNOSIS — M1990 Unspecified osteoarthritis, unspecified site: Secondary | ICD-10-CM | POA: Insufficient documentation

## 2015-12-25 DIAGNOSIS — Z85038 Personal history of other malignant neoplasm of large intestine: Secondary | ICD-10-CM | POA: Diagnosis not present

## 2015-12-25 DIAGNOSIS — Z803 Family history of malignant neoplasm of breast: Secondary | ICD-10-CM | POA: Diagnosis not present

## 2015-12-25 DIAGNOSIS — N6489 Other specified disorders of breast: Secondary | ICD-10-CM | POA: Insufficient documentation

## 2015-12-25 DIAGNOSIS — C50412 Malignant neoplasm of upper-outer quadrant of left female breast: Secondary | ICD-10-CM | POA: Insufficient documentation

## 2015-12-25 HISTORY — PX: PORTACATH PLACEMENT: SHX2246

## 2015-12-25 SURGERY — INSERTION, TUNNELED CENTRAL VENOUS DEVICE, WITH PORT
Anesthesia: General | Laterality: Right

## 2015-12-25 MED ORDER — SODIUM CHLORIDE 0.9% FLUSH: 3.0000 mL | INTRAVENOUS | Status: DC | PRN

## 2015-12-25 MED ORDER — HYDROCODONE-ACETAMINOPHEN 5-325 MG PO TABS: 1.0000 | ORAL_TABLET | Freq: Four times a day (QID) | ORAL | 0 refills | Status: DC | PRN

## 2015-12-25 MED ORDER — EPHEDRINE SULFATE 50 MG/ML IJ SOLN
INTRAMUSCULAR | Status: DC | PRN
  Administered 2015-12-25: 10 mg via INTRAVENOUS

## 2015-12-25 MED ORDER — SCOPOLAMINE 1 MG/3DAYS TD PT72
MEDICATED_PATCH | TRANSDERMAL | Status: DC | PRN
  Administered 2015-12-25: 1 via TRANSDERMAL

## 2015-12-25 MED ORDER — LIDOCAINE HCL (CARDIAC) 20 MG/ML IV SOLN
INTRAVENOUS | Status: DC | PRN
  Administered 2015-12-25: 100 mg via INTRAVENOUS

## 2015-12-25 MED ORDER — ACETAMINOPHEN-CODEINE #3 300-30 MG PO TABS: 1.0000 | ORAL_TABLET | ORAL | 0 refills | Status: DC | PRN

## 2015-12-25 MED ORDER — SODIUM CHLORIDE 0.9 % IJ SOLN
INTRAMUSCULAR | Status: AC
  Filled 2015-12-25: qty 10

## 2015-12-25 MED ORDER — PROPOFOL 10 MG/ML IV BOLUS
INTRAVENOUS | Status: AC
  Filled 2015-12-25: qty 20

## 2015-12-25 MED ORDER — FENTANYL CITRATE (PF) 100 MCG/2ML IJ SOLN: 25.0000 ug | INTRAMUSCULAR | Status: DC | PRN

## 2015-12-25 MED ORDER — DEXAMETHASONE SODIUM PHOSPHATE 10 MG/ML IJ SOLN
INTRAMUSCULAR | Status: AC
  Filled 2015-12-25: qty 1

## 2015-12-25 MED ORDER — MIDAZOLAM HCL 5 MG/5ML IJ SOLN
INTRAMUSCULAR | Status: DC | PRN
  Administered 2015-12-25 (×2): 1 mg via INTRAVENOUS

## 2015-12-25 MED ORDER — SCOPOLAMINE 1 MG/3DAYS TD PT72
MEDICATED_PATCH | TRANSDERMAL | Status: AC
  Filled 2015-12-25: qty 1

## 2015-12-25 MED ORDER — ONDANSETRON HCL 4 MG/2ML IJ SOLN
INTRAMUSCULAR | Status: DC | PRN
  Administered 2015-12-25: 4 mg via INTRAVENOUS

## 2015-12-25 MED ORDER — LACTATED RINGERS IV SOLN: INTRAVENOUS | Status: DC

## 2015-12-25 MED ORDER — MIDAZOLAM HCL 2 MG/2ML IJ SOLN
INTRAMUSCULAR | Status: AC
  Filled 2015-12-25: qty 2

## 2015-12-25 MED ORDER — ACETAMINOPHEN 325 MG PO TABS: 650.0000 mg | ORAL_TABLET | ORAL | Status: DC | PRN

## 2015-12-25 MED ORDER — HEPARIN SOD (PORK) LOCK FLUSH 100 UNIT/ML IV SOLN
INTRAVENOUS | Status: DC | PRN
  Administered 2015-12-25: 500 [IU]

## 2015-12-25 MED ORDER — CHLORHEXIDINE GLUCONATE CLOTH 2 % EX PADS: 6.0000 | MEDICATED_PAD | Freq: Once | CUTANEOUS | Status: DC

## 2015-12-25 MED ORDER — LIDOCAINE HCL (CARDIAC) 20 MG/ML IV SOLN
INTRAVENOUS | Status: AC
  Filled 2015-12-25: qty 5

## 2015-12-25 MED ORDER — CEFAZOLIN SODIUM-DEXTROSE 2-4 GM/100ML-% IV SOLN
2.0000 g | INTRAVENOUS | Status: AC
  Administered 2015-12-25: 2 g via INTRAVENOUS
  Filled 2015-12-25: qty 100

## 2015-12-25 MED ORDER — EPHEDRINE SULFATE 50 MG/ML IJ SOLN
INTRAMUSCULAR | Status: AC
  Filled 2015-12-25: qty 1

## 2015-12-25 MED ORDER — ACETAMINOPHEN 650 MG RE SUPP
650.0000 mg | RECTAL | Status: DC | PRN
  Filled 2015-12-25: qty 1

## 2015-12-25 MED ORDER — SODIUM CHLORIDE 0.9% FLUSH
3.0000 mL | Freq: Two times a day (BID) | INTRAVENOUS | Status: DC
Start: 2015-12-25 — End: 2015-12-25

## 2015-12-25 MED ORDER — FENTANYL CITRATE (PF) 100 MCG/2ML IJ SOLN
INTRAMUSCULAR | Status: AC
  Filled 2015-12-25: qty 2

## 2015-12-25 MED ORDER — FENTANYL CITRATE (PF) 100 MCG/2ML IJ SOLN
INTRAMUSCULAR | Status: DC | PRN
  Administered 2015-12-25 (×2): 25 ug via INTRAVENOUS

## 2015-12-25 MED ORDER — SODIUM CHLORIDE 0.9 % IV SOLN
Freq: Once | INTRAVENOUS | Status: AC
  Administered 2015-12-25: 50 mL
  Filled 2015-12-25: qty 1.2

## 2015-12-25 MED ORDER — DEXAMETHASONE SODIUM PHOSPHATE 4 MG/ML IJ SOLN
INTRAMUSCULAR | Status: DC | PRN
  Administered 2015-12-25: 10 mg via INTRAVENOUS

## 2015-12-25 MED ORDER — ONDANSETRON HCL 4 MG/2ML IJ SOLN
INTRAMUSCULAR | Status: AC
  Filled 2015-12-25: qty 2

## 2015-12-25 MED ORDER — HEPARIN SOD (PORK) LOCK FLUSH 100 UNIT/ML IV SOLN
INTRAVENOUS | Status: AC
  Filled 2015-12-25: qty 5

## 2015-12-25 MED ORDER — LACTATED RINGERS IV SOLN
INTRAVENOUS | Status: DC
  Administered 2015-12-25 (×2): via INTRAVENOUS

## 2015-12-25 MED ORDER — OXYCODONE HCL 5 MG PO TABS: 5.0000 mg | ORAL_TABLET | ORAL | Status: DC | PRN

## 2015-12-25 MED ORDER — BUPIVACAINE-EPINEPHRINE (PF) 0.5% -1:200000 IJ SOLN
INTRAMUSCULAR | Status: DC | PRN
  Administered 2015-12-25: 10 mL

## 2015-12-25 MED ORDER — PROPOFOL 10 MG/ML IV BOLUS
INTRAVENOUS | Status: DC | PRN
  Administered 2015-12-25: 200 mg via INTRAVENOUS

## 2015-12-25 MED ORDER — SODIUM CHLORIDE 0.9 % IV SOLN: 250.0000 mL | INTRAVENOUS | Status: DC | PRN

## 2015-12-25 MED ORDER — BUPIVACAINE-EPINEPHRINE (PF) 0.5% -1:200000 IJ SOLN
INTRAMUSCULAR | Status: AC
  Filled 2015-12-25: qty 30

## 2015-12-25 MED ORDER — CEFAZOLIN SODIUM-DEXTROSE 2-4 GM/100ML-% IV SOLN
INTRAVENOUS | Status: AC
  Filled 2015-12-25: qty 100

## 2015-12-25 MED ORDER — ACETAMINOPHEN 325 MG PO TABS
650.0000 mg | ORAL_TABLET | ORAL | Status: DC | PRN
  Administered 2015-12-25: 650 mg via ORAL
  Filled 2015-12-25: qty 2

## 2015-12-25 SURGICAL SUPPLY — 31 items
ADH SKN CLS APL DERMABOND .7 (GAUZE/BANDAGES/DRESSINGS) ×1
BAG DECANTER FOR FLEXI CONT (MISCELLANEOUS) ×3 IMPLANT
BLADE HEX COATED 2.75 (ELECTRODE) ×3 IMPLANT
BLADE SURG 15 STRL LF DISP TIS (BLADE) ×1 IMPLANT
BLADE SURG 15 STRL SS (BLADE) ×3
CHLORAPREP W/TINT 26ML (MISCELLANEOUS) ×3 IMPLANT
COVER SURGICAL LIGHT HANDLE (MISCELLANEOUS) ×3 IMPLANT
DECANTER SPIKE VIAL GLASS SM (MISCELLANEOUS) ×1 IMPLANT
DERMABOND ADVANCED (GAUZE/BANDAGES/DRESSINGS) ×2
DERMABOND ADVANCED .7 DNX12 (GAUZE/BANDAGES/DRESSINGS) IMPLANT
DRAPE C-ARM 42X120 X-RAY (DRAPES) ×3 IMPLANT
DRAPE LAPAROSCOPIC ABDOMINAL (DRAPES) ×3 IMPLANT
ELECT PENCIL ROCKER SW 15FT (MISCELLANEOUS) ×3 IMPLANT
ELECT REM PT RETURN 9FT ADLT (ELECTROSURGICAL) ×3
ELECTRODE REM PT RTRN 9FT ADLT (ELECTROSURGICAL) ×1 IMPLANT
GAUZE SPONGE 4X4 16PLY XRAY LF (GAUZE/BANDAGES/DRESSINGS) ×3 IMPLANT
GLOVE EUDERMIC 7 POWDERFREE (GLOVE) ×7 IMPLANT
GOWN STRL REUS W/TWL XL LVL3 (GOWN DISPOSABLE) ×6 IMPLANT
KIT BASIN OR (CUSTOM PROCEDURE TRAY) ×3 IMPLANT
KIT PORT POWER 8FR ISP CVUE (Catheter) ×2 IMPLANT
MARKER SKIN DUAL TIP RULER LAB (MISCELLANEOUS) ×3 IMPLANT
NEEDLE HYPO 22GX1.5 SAFETY (NEEDLE) ×3 IMPLANT
PACK BASIC VI WITH GOWN DISP (CUSTOM PROCEDURE TRAY) ×3 IMPLANT
SUT MNCRL AB 4-0 PS2 18 (SUTURE) ×3 IMPLANT
SUT PROLENE 2 0 SH DA (SUTURE) ×1 IMPLANT
SUT VIC AB 3-0 SH 18 (SUTURE) ×3 IMPLANT
SYR 20CC LL (SYRINGE) ×3 IMPLANT
SYR BULB IRRIGATION 50ML (SYRINGE) IMPLANT
SYRINGE 10CC LL (SYRINGE) ×3 IMPLANT
TOWEL OR 17X26 10 PK STRL BLUE (TOWEL DISPOSABLE) ×3 IMPLANT
TOWEL OR NON WOVEN STRL DISP B (DISPOSABLE) ×3 IMPLANT

## 2015-12-25 NOTE — Discharge Instructions (Signed)
PORT-A-CATH: POST OP INSTRUCTIONS  Always review your discharge instruction sheet given to you by the facility where your surgery was performed.   1. A prescription for pain medication may be given to you upon discharge. Take your pain medication as prescribed, if needed. If narcotic pain medicine is not needed, then you make take acetaminophen (Tylenol) or ibuprofen (Advil) as needed.  2. Take your usually prescribed medications unless otherwise directed. 3. If you need a refill on your pain medication, please contact our office. All narcotic pain medicine now requires a paper prescription.  Phoned in and fax refills are no longer allowed by law.  Prescriptions will not be filled after 5 pm or on weekends.  4. You should follow a light diet for the remainder of the day after your procedure. 5. Most patients will experience some mild swelling and/or bruising in the area of the incision. It may take several days to resolve. 6. It is common to experience some constipation if taking pain medication after surgery. Increasing fluid intake and taking a stool softener (such as Colace) will usually help or prevent this problem from occurring. A mild laxative (Milk of Magnesia or Miralax) should be taken according to package directions if there are no bowel movements after 48 hours.  7. Unless discharge instructions indicate otherwise, you may remove your bandages 48 hours after surgery, and you may shower at that time. You may have steri-strips (small white skin tapes) in place directly over the incision.  These strips should be left on the skin for 7-10 days.  If your surgeon used Dermabond (skin glue) on the incision, you may shower in 24 hours.  The glue will flake off over the next 2-3 weeks.  8. If your port is left accessed at the end of surgery (needle left in port), the dressing cannot get wet and should only by changed by a healthcare professional. When the port is no longer accessed (when the  needle has been removed), follow step 7.   9. ACTIVITIES:  Limit activity involving your arms for the next 72 hours. Do no strenuous exercise or activity for 1 week. You may drive when you are no longer taking prescription pain medication, you can comfortably wear a seatbelt, and you can maneuver your car. 10.You may need to see your doctor in the office for a follow-up appointment.  Please       check with your doctor.  11.When you receive a new Port-a-Cath, you will get a product guide and        ID card.  Please keep them in case you need them.  WHEN TO CALL YOUR DOCTOR [phone]): 1. Fever over 101.0 2. Chills 3. Continued bleeding from incision 4. Increased redness and tenderness at the site 5. Shortness of breath, difficulty breathing   The clinic staff is available to answer your questions during regular business hours. Please dont hesitate to call and ask to speak to one of the nurses or medical assistants for clinical concerns. If you have a medical emergency, go to the nearest emergency room or call 911.  A surgeon from [facility] is always on call at the hospital.     For further information, please visit [email or web address]        PORT-A-CATH: POST OP INSTRUCTIONS  Always review your discharge instruction sheet given to you by the facility where your surgery was performed.   10. A prescription for pain medication may be given to you upon  discharge. Take your pain medication as prescribed, if needed. If narcotic pain medicine is not needed, then you make take acetaminophen (Tylenol) or ibuprofen (Advil) as needed.  11. Take your usually prescribed medications unless otherwise directed. 12. If you need a refill on your pain medication, please contact our office. All narcotic pain medicine now requires a paper prescription.  Phoned in and fax refills are no longer allowed by law.  Prescriptions will not be filled after 5 pm or on weekends.   13. You should follow a light diet for the remainder of the day after your procedure. 14. Most patients will experience some mild swelling and/or bruising in the area of the incision. It may take several days to resolve. 15. It is common to experience some constipation if taking pain medication after surgery. Increasing fluid intake and taking a stool softener (such as Colace) will usually help or prevent this problem from occurring. A mild laxative (Milk of Magnesia or Miralax) should be taken according to package directions if there are no bowel movements after 48 hours.  16. Unless discharge instructions indicate otherwise, you may remove your bandages 48 hours after surgery, and you may shower at that time. You may have steri-strips (small white skin tapes) in place directly over the incision.  These strips should be left on the skin for 7-10 days.  If your surgeon used Dermabond (skin glue) on the incision, you may shower in 24 hours.  The glue will flake off over the next 2-3 weeks.  17. If your port is left accessed at the end of surgery (needle left in port), the dressing cannot get wet and should only by changed by a healthcare professional. When the port is no longer accessed (when the needle has been removed), follow step 7.   18. ACTIVITIES:  Limit activity involving your arms for the next 72 hours. Do no strenuous exercise or activity for 1 week. You may drive when you are no longer taking prescription pain medication, you can comfortably wear a seatbelt, and you can maneuver your car. 10.You may need to see your doctor in the office for a follow-up appointment.  Please       check with your doctor.  11.When you receive a new Port-a-Cath, you will get a product guide and        ID card.  Please keep them in case you need them.  WHEN TO CALL YOUR DOCTOR [phone]): 6. Fever over 101.0 7. Chills 8. Continued bleeding from incision 9. Increased redness and tenderness at the  site 10. Shortness of breath, difficulty breathing   The clinic staff is available to answer your questions during regular business hours. Please dont hesitate to call and ask to speak to one of the nurses or medical assistants for clinical concerns. If you have a medical emergency, go to the nearest emergency room or call 911.  A surgeon from [facility] is always on call at the hospital.     For further information, please visit [email or web address]    General Anesthesia, Adult, Care After Refer to this sheet in the next few weeks. These instructions provide you with information on caring for yourself after your procedure. Your health care provider may also give you more specific instructions. Your treatment has been planned according to current medical practices, but problems sometimes occur. Call your health care provider if you have any problems or questions after your procedure. WHAT TO EXPECT AFTER THE PROCEDURE After the procedure, it  is typical to experience:  Sleepiness.  Nausea and vomiting. HOME CARE INSTRUCTIONS  For the first 24 hours after general anesthesia:  Have a responsible person with you.  Do not drive a car. If you are alone, do not take public transportation.  Do not drink alcohol.  Do not take medicine that has not been prescribed by your health care provider.  Do not sign important papers or make important decisions.  You may resume a normal diet and activities as directed by your health care provider.  Change bandages (dressings) as directed.  If you have questions or problems that seem related to general anesthesia, call the hospital and ask for the anesthetist or anesthesiologist on call. SEEK MEDICAL CARE IF:  You have nausea and vomiting that continue the day after anesthesia.  You develop a rash. SEEK IMMEDIATE MEDICAL CARE IF:   You have difficulty breathing.  You have chest pain.  You have any allergic  problems.   This information is not intended to replace advice given to you by your health care provider. Make sure you discuss any questions you have with your health care provider.   Document Released: 08/17/2000 Document Revised: 06/01/2014 Document Reviewed: 09/09/2011 Elsevier Interactive Patient Education [employer].

## 2015-12-25 NOTE — Progress Notes (Unsigned)
Applied for patient for co-pay assistance for Herceptin and Perjeta. Patient was approved. Patient will receive information via email about the program and will receive co-pay card in the mail. I have a copy of the co-pay card as well.

## 2015-12-25 NOTE — Transfer of Care (Signed)
Immediate Anesthesia Transfer of Care Note  Patient: Alexandra Mathis  Procedure(s) Performed: Procedure(s): INSERTION PORT-A-CATH WITH [place] (Right)  Patient Location: PACU  Anesthesia Type:General  Level of Consciousness:  sedated, patient cooperative and responds to stimulation  Airway & Oxygen Therapy:Patient Spontanous Breathing and Patient connected to face mask oxgen  Post-op Assessment:  Report given to PACU RN and Post -op Vital signs reviewed and stable  Post vital signs:  Reviewed and stable  Last Vitals:  Vitals:   12/25/15 0843  BP: 121/83  Pulse: 82  Resp: 16  Temp: [id number] C    Complications: No apparent anesthesia complications

## 2015-12-25 NOTE — Progress Notes (Signed)
PACU- Portable Chest X-ray results noted

## 2015-12-25 NOTE — Anesthesia Procedure Notes (Signed)
Procedure Name: LMA Insertion Date/Time: 12/25/2015 10:50 AM Performed by: Maxwell Caul Pre-anesthesia Checklist: Patient identified, Emergency Drugs available, Suction available and Patient being monitored Patient Re-evaluated:Patient Re-evaluated prior to inductionOxygen Delivery Method: Circle system utilized Preoxygenation: Pre-oxygenation with 100% oxygen Intubation Type: IV induction LMA: LMA inserted LMA Size: 4.0 Number of attempts: 1 Placement Confirmation: positive ETCO2,  CO2 detector and breath sounds checked- equal and bilateral Tube secured with: Tape Dental Injury: Teeth and Oropharynx as per pre-operative assessment

## 2015-12-25 NOTE — Progress Notes (Unsigned)
Received approval letter via fax from [employer]. Patient approved for $25,000 12/25/15-12/23/16 for Herceptin as well as [employer] for $25,000 12/25/15-12/23/16. Approval emailed to [employer] [employer] in billing. Copies sent to HIM to be scanned.

## 2015-12-25 NOTE — Interval H&P Note (Signed)
History and Physical Interval Note:  12/25/2015 10:41 AM  Alexandra Mathis  has presented today for surgery, with the diagnosis of LEFT BREAST CANCER  The various methods of treatment have been discussed with the patient and family. After consideration of risks, benefits and other options for treatment, the patient has consented to  Procedure(s): INSERTION PORT-A-CATH WITH [place] (N/A) as a surgical intervention .  The patient's history has been reviewed, patient examined, no change in status, stable for surgery.  I have reviewed the patient's chart and labs.  Questions were answered to the patient's satisfaction.     [name]

## 2015-12-25 NOTE — Progress Notes (Unsigned)
Left voicemail for spouse informing patient was approved for copay assistance through [employer]. Left my contact name and number for any additional questions or concerns.

## 2015-12-25 NOTE — Anesthesia Postprocedure Evaluation (Signed)
Anesthesia Post Note  Patient: MONIYA MEDITZ  Procedure(s) Performed: Procedure(s) (LRB): INSERTION PORT-A-CATH WITH [place] (Right)  Patient location during evaluation: PACU Anesthesia Type: General Level of consciousness: awake and alert Pain management: pain level controlled Vital Signs Assessment: post-procedure vital signs reviewed and stable Respiratory status: spontaneous breathing, nonlabored ventilation, respiratory function stable and patient connected to nasal cannula oxygen Cardiovascular status: blood pressure returned to baseline and stable Postop Assessment: no signs of nausea or vomiting Anesthetic complications: no    Last Vitals:  Vitals:   12/25/15 1230 12/25/15 1250  BP: 134/81 136/80  Pulse: 80 82  Resp: 15 16  Temp: 36.9 C 36.7 C    Last Pain:  Vitals:   12/25/15 1300  TempSrc:   PainSc: 2                  Kimbella Heisler L

## 2015-12-25 NOTE — Progress Notes (Signed)
PACU-  Portable upright Chest X-RAY done.

## 2015-12-25 NOTE — Op Note (Signed)
Patient Name:           Alexandra Mathis   Date of Surgery:        12/25/2015  Pre op Diagnosis:      Invasive cancer left breast, upper outer quadrant  Post op Diagnosis:    Same  Procedure:                 Insertion of power point ClearVue 8 French tunneled venous vascular access device                                      Use of fluoroscopy for guidance and positioning  Surgeon:                     [name]. Dalbert Batman, M.D., FACS  Assistant:                      OR staff  Operative Indications:       This is a very pleasant 60 year old Caucasian female, referred by Dr. Evangeline Dakin at the breast center of [facility] for evaluation and management of a newly diagnosed invasive cancer of the left breast upper outer quadrant. Her primary care provider is Benjamine Mola, [place]. This is an [facility]. She was seen in the [facility]  by Dr. Jana Hakim, Dr. Lisbeth Renshaw, and me.      She has no prior history of breast disease. Last mammogram 2012. She went for screening mammogram, she was asymptomatic. The mammogram shows a 4 cm area of microcalcifications in her ultrasound there was a 1.8 cm solid mass in the middle of this. This is a fairly large area considering the size of her breast and almost fills the upper outer quadrant. 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 A biopsy of the left axilla was negative for a borderline lymph node. Right breast looks normal. She has developed a hematoma in the left breast. Dr. Purcell Nails is concerned that the microcalcifications may represent DCIS and if lumpectomy is considered these areas certainly need to be biopsied to determine the extent of disease. The patient states she has done a lot of research, and is really not interested lumpectomy and wants a mastectomy.      MRI shows an indeterminate  mass in the right breast and that is scheduled for MRI guided biopsy tomorrow.     Family  history reveals sister diagnosed with breast cancer at age 62. Also had ovarian cancer. A survivor. 2 paternal first cousins had breast cancer. Father had colon cancer.  The patient is scheduled for genetic counseling and genetic testing.    We had  a long discussion about breast cancer management in general and in her case specifically. We have decided to give 3 months of neoadjuvant chemotherapy, followed by definitive surgery, and then complete a full year of Herceptin.       She is not interested in lumpectomy and radiation therapy. She prefers left mastectomy, sentinel lymph node biopsy and immediate reconstruction. She is contemplating bilateral mastectomy.  She has already completed an initial consultation with Dr. Iran Planas of plastic surgery.   She'll be scheduled for Port-A-Cath insertion with ultrasound. She is familiar with this because her father had one.   Operative Findings:       The port was placed through the  right subclavian vein without difficulty.  There was excellent blood return and excellent flushing in the operating room.  It appeared well-positioned by C-arm.  Procedure in Detail:          Following the induction of general LMA anesthesia the patient was positioned with a roll behind her shoulders and her arms tucked at her sides.  The neck and chest were prepped and draped in a sterile fashion.  Surgical timeout was performed.  Intravenous antibiotics were given.  0.5% Marcaine with epinephrine was used as local infiltration anesthetic.      A right subclavian venipuncture was performed with blood return on a single pass and a guidewire inserted into the superior vena cava under fluoroscopic guidance.  Using the C-arm as a guide I drew a template on the chest wall to guide positioning of the catheter so the tip would be in the superior vena cava just above the right atrium.  A transverse incision was made below the midpoint of the right clavicle.  A subcutaneous  pocket was created at the level of the pectoralis fascia..  Using a tunneling device I passed the catheter from the wire insertion site to the port pocket site.  Using the template marking the chest wall I measured the catheter and cut it 19.5 cm.  The catheter was secured to the port with the locking device, flushed with heparinized saline and sutured to the pectoralis fascia with 3 interrupted sutures of 2-0 Prolene.  The dilator and peel-away sheath assembly was inserted over the guidewire into the central venous circulation, the dilator and wire removed, the catheter threaded easily, and the peel-away sheath removed.  It flushed easily and had excellent blood return.  Fluoroscopy confirmed good positioning with the catheter tip in the superior vena cava above the right atrium and there was no deformity of the catheter anywhere along its course.  The port and catheter were flushed with concentrated heparin.  There was no bleeding.  The subcutaneous tissue was closed with 3-0 Vicryl sutures and skin incisions closed with subcuticular 4-0 Monocryl and Dermabond.  The patient tolerated the procedure well was taken to PACU in stable condition.  EBL 10 mL.  Counts correct.  Complications none.  Chest x-ray will be obtained in the PACU.     [name]. Dalbert Batman, M.D., FACS General and Minimally Invasive Surgery Breast and Colorectal Surgery  12/25/2015 11:39 AM

## 2015-12-26 ENCOUNTER — Ambulatory Visit
Admission: RE | Admit: 2015-12-26 | Discharge: 2015-12-26 | Disposition: A | Discharge status: Home / Self Care | Payer: BC Managed Care – PPO | Source: Ambulatory Visit | Attending: General Surgery | Admitting: General Surgery

## 2015-12-26 DIAGNOSIS — R928 Other abnormal and inconclusive findings on diagnostic imaging of breast: Secondary | ICD-10-CM

## 2015-12-26 MED ORDER — GADOBENATE DIMEGLUMINE 529 MG/ML IV SOLN
14.0000 mL | Freq: Once | INTRAVENOUS | Status: AC | PRN
  Administered 2015-12-26: 14 mL via INTRAVENOUS

## 2015-12-27 ENCOUNTER — Other Ambulatory Visit (HOSPITAL_BASED_OUTPATIENT_CLINIC_OR_DEPARTMENT_OTHER): Payer: BC Managed Care – PPO

## 2015-12-27 ENCOUNTER — Telehealth: Payer: Self-pay | Admitting: Genetic Counselor

## 2015-12-27 ENCOUNTER — Encounter: Payer: Self-pay | Admitting: *Deleted

## 2015-12-27 ENCOUNTER — Telehealth: Payer: Self-pay

## 2015-12-27 ENCOUNTER — Ambulatory Visit (HOSPITAL_BASED_OUTPATIENT_CLINIC_OR_DEPARTMENT_OTHER): Payer: BC Managed Care – PPO

## 2015-12-27 VITALS — BP 109/65 | HR 87 | Temp 99.5°F | Resp 17

## 2015-12-27 DIAGNOSIS — Z5111 Encounter for antineoplastic chemotherapy: Secondary | ICD-10-CM

## 2015-12-27 DIAGNOSIS — C50412 Malignant neoplasm of upper-outer quadrant of left female breast: Secondary | ICD-10-CM

## 2015-12-27 DIAGNOSIS — Z5112 Encounter for antineoplastic immunotherapy: Secondary | ICD-10-CM

## 2015-12-27 LAB — COMPREHENSIVE METABOLIC PANEL
ALBUMIN: 3.9 g/dL (ref 3.5–5.0)
ALK PHOS: 60 U/L (ref 40–150)
ALT: 24 U/L (ref 0–55)
AST: 29 U/L (ref 5–34)
Anion Gap: 10 mEq/L (ref 3–11)
BILIRUBIN TOTAL: 0.42 mg/dL (ref 0.20–1.20)
BUN: 18.8 mg/dL (ref 7.0–26.0)
CO2: 29 mEq/L (ref 22–29)
Calcium: 9.7 mg/dL (ref 8.4–10.4)
Chloride: 104 mEq/L (ref 98–109)
Creatinine: 0.8 mg/dL (ref 0.6–1.1)
EGFR: 81 mL/min/{1.73_m2} — ABNORMAL LOW (ref >90)
GLUCOSE: 80 mg/dL (ref 70–140)
POTASSIUM: 4.1 meq/L (ref 3.5–5.1)
SODIUM: 143 meq/L (ref 136–145)
TOTAL PROTEIN: 7.1 g/dL (ref 6.4–8.3)

## 2015-12-27 LAB — CBC WITH DIFFERENTIAL/PLATELET
BASO%: 0.5 % (ref 0.0–2.0)
Basophils Absolute: 0 10*3/uL (ref 0.0–0.1)
EOS%: 1.2 % (ref 0.0–7.0)
Eosinophils Absolute: 0.1 10*3/uL (ref 0.0–0.5)
HCT: 37.7 % (ref 34.8–46.6)
HEMOGLOBIN: 12.5 g/dL (ref 11.6–15.9)
LYMPH%: 33.2 % (ref 14.0–49.7)
MCH: 29.6 pg (ref 25.1–34.0)
MCHC: 33 g/dL (ref 31.5–36.0)
MCV: 89.5 fL (ref 79.5–101.0)
MONO#: 0.5 10*3/uL (ref 0.1–0.9)
MONO%: 9.2 % (ref 0.0–14.0)
NEUT%: 55.9 % (ref 38.4–76.8)
NEUTROS ABS: 2.8 10*3/uL (ref 1.5–6.5)
Platelets: 174 10*3/uL (ref 145–400)
RBC: 4.21 10*6/uL (ref 3.70–5.45)
RDW: 14.1 % (ref 11.2–14.5)
WBC: 5 10*3/uL (ref 3.9–10.3)
lymph#: 1.7 10*3/uL (ref 0.9–3.3)

## 2015-12-27 MED ORDER — DIPHENHYDRAMINE HCL 50 MG/ML IJ SOLN
INTRAMUSCULAR | Status: AC
  Filled 2015-12-27: qty 1

## 2015-12-27 MED ORDER — SODIUM CHLORIDE 0.9 % IV SOLN
Freq: Once | INTRAVENOUS | Status: AC
  Administered 2015-12-27: 10:00:00 via INTRAVENOUS

## 2015-12-27 MED ORDER — HEPARIN SOD (PORK) LOCK FLUSH 100 UNIT/ML IV SOLN
500.0000 [IU] | Freq: Once | INTRAVENOUS | Status: AC | PRN
  Administered 2015-12-27: 500 [IU]
  Filled 2015-12-27: qty 5

## 2015-12-27 MED ORDER — FAMOTIDINE IN NACL 20-0.9 MG/50ML-% IV SOLN
INTRAVENOUS | Status: AC
  Filled 2015-12-27: qty 50

## 2015-12-27 MED ORDER — ACETAMINOPHEN 325 MG PO TABS
ORAL_TABLET | ORAL | Status: AC
  Filled 2015-12-27: qty 2

## 2015-12-27 MED ORDER — FAMOTIDINE IN NACL 20-0.9 MG/50ML-% IV SOLN
20.0000 mg | Freq: Once | INTRAVENOUS | Status: AC
  Administered 2015-12-27: 20 mg via INTRAVENOUS

## 2015-12-27 MED ORDER — DIPHENHYDRAMINE HCL 50 MG/ML IJ SOLN
25.0000 mg | Freq: Once | INTRAMUSCULAR | Status: AC
  Administered 2015-12-27: 25 mg via INTRAVENOUS

## 2015-12-27 MED ORDER — SODIUM CHLORIDE 0.9 % IV SOLN
8.0000 mg/kg | Freq: Once | INTRAVENOUS | Status: AC
  Administered 2015-12-27: 546 mg via INTRAVENOUS
  Filled 2015-12-27: qty 26

## 2015-12-27 MED ORDER — SODIUM CHLORIDE 0.9 % IV SOLN
80.0000 mg/m2 | Freq: Once | INTRAVENOUS | Status: AC
  Administered 2015-12-27: 144 mg via INTRAVENOUS
  Filled 2015-12-27: qty 24

## 2015-12-27 MED ORDER — SODIUM CHLORIDE 0.9 % IV SOLN
840.0000 mg | Freq: Once | INTRAVENOUS | Status: AC
  Administered 2015-12-27: 840 mg via INTRAVENOUS
  Filled 2015-12-27: qty 14

## 2015-12-27 MED ORDER — ACETAMINOPHEN 325 MG PO TABS
650.0000 mg | ORAL_TABLET | Freq: Once | ORAL | Status: AC
  Administered 2015-12-27: 650 mg via ORAL

## 2015-12-27 MED ORDER — DIPHENHYDRAMINE HCL 25 MG PO CAPS
ORAL_CAPSULE | ORAL | Status: AC
  Filled 2015-12-27: qty 1

## 2015-12-27 MED ORDER — SODIUM CHLORIDE 0.9% FLUSH
10.0000 mL | INTRAVENOUS | Status: DC | PRN
  Administered 2015-12-27: 10 mL
  Filled 2015-12-27: qty 10

## 2015-12-27 MED ORDER — SODIUM CHLORIDE 0.9 % IV SOLN
20.0000 mg | Freq: Once | INTRAVENOUS | Status: AC
  Administered 2015-12-27: 20 mg via INTRAVENOUS
  Filled 2015-12-27: qty 2

## 2015-12-27 MED ORDER — DIPHENHYDRAMINE HCL 25 MG PO CAPS
25.0000 mg | ORAL_CAPSULE | Freq: Once | ORAL | Status: AC
  Administered 2015-12-27: 25 mg via ORAL

## 2015-12-27 NOTE — Telephone Encounter (Signed)
Spoke with Ms. Alexandra Mathis and her sister Alexandra Mathis on the phone regarding Ms. Alexandra Mathis's upcoming genetic counseling appt on 8/17.  Ms. Alexandra Mathis and Alexandra Mathis had some questions regarding what information they should prepare prior to Ms. Alexandra Mathis's upcoming appointment.  There seems to be a lot of family history of cancer, so they are understandably concerned and are trying to be proactive about theirs and their family members' future care.  Alexandra Mathis will not be coming to the appt, as she lives in [place], [place], but I found three [facility] for her in the area, and I am going to email her their contact information, so that she can set up an appt for herself.

## 2015-12-27 NOTE — Telephone Encounter (Signed)
Pt is requesting flush appt for lab work to avoid 2 sticks. POF sent

## 2015-12-27 NOTE — Patient Instructions (Addendum)
[facility] Discharge Instructions for Patients Receiving Chemotherapy  Today you received the following chemotherapy agents:  Herceptin, Perjeta, Taxol  To help prevent nausea and vomiting after your treatment, we encourage you to take your nausea medication as prescribed.   If you develop nausea and vomiting that is not controlled by your nausea medication, call the clinic.   BELOW ARE SYMPTOMS THAT SHOULD BE REPORTED IMMEDIATELY:  *FEVER GREATER THAN 100.5 F  *CHILLS WITH OR WITHOUT FEVER  NAUSEA AND VOMITING THAT IS NOT CONTROLLED WITH YOUR NAUSEA MEDICATION  *UNUSUAL SHORTNESS OF BREATH  *UNUSUAL BRUISING OR BLEEDING  TENDERNESS IN MOUTH AND THROAT WITH OR WITHOUT PRESENCE OF ULCERS  *URINARY PROBLEMS  *BOWEL PROBLEMS  UNUSUAL RASH Items with * indicate a potential emergency and should be followed up as soon as possible.  Feel free to call the clinic you have any questions or concerns. The clinic phone number is [phone].  Please show the [facility] at check-in to the Emergency Department and triage nurse.    Paclitaxel injection What is this medicine? PACLITAXEL (PAK li TAX el) is a chemotherapy drug. It targets fast dividing cells, like cancer cells, and causes these cells to die. This medicine is used to treat ovarian cancer, breast cancer, and other cancers. This medicine may be used for other purposes; ask your health care provider or pharmacist if you have questions. What should I tell my health care provider before I take this medicine? They need to know if you have any of these conditions: -blood disorders -irregular heartbeat -infection (especially a virus infection such as chickenpox, cold sores, or herpes) -liver disease -previous or ongoing radiation therapy -an unusual or allergic reaction to paclitaxel, alcohol, polyoxyethylated castor oil, other chemotherapy agents, other medicines, foods, dyes, or preservatives -pregnant  or trying to get pregnant -breast-feeding How should I use this medicine? This drug is given as an infusion into a vein. It is administered in a hospital or clinic by a specially trained health care professional. Talk to your pediatrician regarding the use of this medicine in children. Special care may be needed. Overdosage: If you think you have taken too much of this medicine contact a poison control center or emergency room at once. NOTE: This medicine is only for you. Do not share this medicine with others. What if I miss a dose? It is important not to miss your dose. Call your doctor or health care professional if you are unable to keep an appointment. What may interact with this medicine? Do not take this medicine with any of the following medications: -disulfiram -metronidazole This medicine may also interact with the following medications: -cyclosporine -diazepam -ketoconazole -medicines to increase blood counts like filgrastim, pegfilgrastim, sargramostim -other chemotherapy drugs like cisplatin, doxorubicin, epirubicin, etoposide, teniposide, vincristine -quinidine -testosterone -vaccines -verapamil Talk to your doctor or health care professional before taking any of these medicines: -acetaminophen -aspirin -ibuprofen -ketoprofen -naproxen This list may not describe all possible interactions. Give your health care provider a list of all the medicines, herbs, non-prescription drugs, or dietary supplements you use. Also tell them if you smoke, drink alcohol, or use illegal drugs. Some items may interact with your medicine. What should I watch for while using this medicine? Your condition will be monitored carefully while you are receiving this medicine. You will need important blood work done while you are taking this medicine. This drug may make you feel generally unwell. This is not uncommon, as chemotherapy can affect healthy  cells as well as cancer cells. Report any side  effects. Continue your course of treatment even though you feel ill unless your doctor tells you to stop. This medicine can cause serious allergic reactions. To reduce your risk you will need to take other medicine(s) before treatment with this medicine. In some cases, you may be given additional medicines to help with side effects. Follow all directions for their use. Call your doctor or health care professional for advice if you get a fever, chills or sore throat, or other symptoms of a cold or flu. Do not treat yourself. This drug decreases your body's ability to fight infections. Try to avoid being around people who are sick. This medicine may increase your risk to bruise or bleed. Call your doctor or health care professional if you notice any unusual bleeding. Be careful brushing and flossing your teeth or using a toothpick because you may get an infection or bleed more easily. If you have any dental work done, tell your dentist you are receiving this medicine. Avoid taking products that contain aspirin, acetaminophen, ibuprofen, naproxen, or ketoprofen unless instructed by your doctor. These medicines may hide a fever. Do not become pregnant while taking this medicine. Women should inform their doctor if they wish to become pregnant or think they might be pregnant. There is a potential for serious side effects to an unborn child. Talk to your health care professional or pharmacist for more information. Do not breast-feed an infant while taking this medicine. Men are advised not to father a child while receiving this medicine. This product may contain alcohol. Ask your pharmacist or healthcare provider if this medicine contains alcohol. Be sure to tell all healthcare providers you are taking this medicine. Certain medicines, like metronidazole and disulfiram, can cause an unpleasant reaction when taken with alcohol. The reaction includes flushing, headache, nausea, vomiting, sweating, and increased  thirst. The reaction can last from 30 minutes to several hours. What side effects may I notice from receiving this medicine? Side effects that you should report to your doctor or health care professional as soon as possible: -allergic reactions like skin rash, itching or hives, swelling of the face, lips, or tongue -low blood counts - This drug may decrease the number of white blood cells, red blood cells and platelets. You may be at increased risk for infections and bleeding. -signs of infection - fever or chills, cough, sore throat, pain or difficulty passing urine -signs of decreased platelets or bleeding - bruising, pinpoint red spots on the skin, black, tarry stools, nosebleeds -signs of decreased red blood cells - unusually weak or tired, fainting spells, lightheadedness -breathing problems -chest pain -high or low blood pressure -mouth sores -nausea and vomiting -pain, swelling, redness or irritation at the injection site -pain, tingling, numbness in the hands or feet -slow or irregular heartbeat -swelling of the ankle, feet, hands Side effects that usually do not require medical attention (report to your doctor or health care professional if they continue or are bothersome): -bone pain -complete hair loss including hair on your head, underarms, pubic hair, eyebrows, and eyelashes -changes in the color of fingernails -diarrhea -loosening of the fingernails -loss of appetite -muscle or joint pain -red flush to skin -sweating This list may not describe all possible side effects. Call your doctor for medical advice about side effects. You may report side effects to FDA at 1-800-FDA-1088. Where should I keep my medicine? This drug is given in a hospital or clinic and will not  be stored at home. NOTE: This sheet is a summary. It may not cover all possible information. If you have questions about this medicine, talk to your doctor, pharmacist, or health care provider.    2016,  [employer] Standard. (2014-12-27 13:02:56)   Pertuzumab injection What is this medicine? PERTUZUMAB (per TOOZ ue mab) is a monoclonal antibody. It is used to treat breast cancer. This medicine may be used for other purposes; ask your health care provider or pharmacist if you have questions. What should I tell my health care provider before I take this medicine? They need to know if you have any of these conditions: -heart disease -heart failure -high blood pressure -history of irregular heart beat -recent or ongoing radiation therapy -an unusual or allergic reaction to pertuzumab, other medicines, foods, dyes, or preservatives -pregnant or trying to get pregnant -breast-feeding How should I use this medicine? This medicine is for infusion into a vein. It is given by a health care professional in a hospital or clinic setting. Talk to your pediatrician regarding the use of this medicine in children. Special care may be needed. Overdosage: If you think you have taken too much of this medicine contact a poison control center or emergency room at once. NOTE: This medicine is only for you. Do not share this medicine with others. What if I miss a dose? It is important not to miss your dose. Call your doctor or health care professional if you are unable to keep an appointment. What may interact with this medicine? Interactions are not expected. Give your health care provider a list of all the medicines, herbs, non-prescription drugs, or dietary supplements you use. Also tell them if you smoke, drink alcohol, or use illegal drugs. Some items may interact with your medicine. This list may not describe all possible interactions. Give your health care provider a list of all the medicines, herbs, non-prescription drugs, or dietary supplements you use. Also tell them if you smoke, drink alcohol, or use illegal drugs. Some items may interact with your medicine. What should I watch for while using  this medicine? Your condition will be monitored carefully while you are receiving this medicine. Report any side effects. Continue your course of treatment even though you feel ill unless your doctor tells you to stop. Do not become pregnant while taking this medicine or for 7 months after stopping it. Women should inform their doctor if they wish to become pregnant or think they might be pregnant. Women of child-bearing potential will need to have a negative pregnancy test before starting this medicine. There is a potential for serious side effects to an unborn child. Talk to your health care professional or pharmacist for more information. Do not breast-feed an infant while taking this medicine or for 7 months after stopping it. Women must use effective birth control with this medicine. Call your doctor or health care professional for advice if you get a fever, chills or sore throat, or other symptoms of a cold or flu. Do not treat yourself. Try to avoid being around people who are sick. You may experience fever, chills, and headache during the infusion. Report any side effects during the infusion to your health care professional. What side effects may I notice from receiving this medicine? Side effects that you should report to your doctor or health care professional as soon as possible: -breathing problems -chest pain or palpitations -dizziness -feeling faint or lightheaded -fever or chills -skin rash, itching or hives -sore throat -swelling of  the face, lips, or tongue -swelling of the legs or ankles -unusually weak or tired Side effects that usually do not require medical attention (Report these to your doctor or health care professional if they continue or are bothersome.): -diarrhea -hair loss -nausea, vomiting -tiredness This list may not describe all possible side effects. Call your doctor for medical advice about side effects. You may report side effects to FDA at  1-800-FDA-1088. Where should I keep my medicine? This drug is given in a hospital or clinic and will not be stored at home. NOTE: This sheet is a summary. It may not cover all possible information. If you have questions about this medicine, talk to your doctor, pharmacist, or health care provider.    2016, [employer] Standard. (2014-08-17 16:07:57)   Trastuzumab injection for infusion What is this medicine? TRASTUZUMAB (tras TOO zoo mab) is a monoclonal antibody. It is used to treat breast cancer and stomach cancer. This medicine may be used for other purposes; ask your health care provider or pharmacist if you have questions. What should I tell my health care provider before I take this medicine? They need to know if you have any of these conditions: -heart disease -heart failure -infection (especially a virus infection such as chickenpox, cold sores, or herpes) -lung or breathing disease, like asthma -recent or ongoing radiation therapy -an unusual or allergic reaction to trastuzumab, benzyl alcohol, or other medications, foods, dyes, or preservatives -pregnant or trying to get pregnant -breast-feeding How should I use this medicine? This drug is given as an infusion into a vein. It is administered in a hospital or clinic by a specially trained health care professional. Talk to your pediatrician regarding the use of this medicine in children. This medicine is not approved for use in children. Overdosage: If you think you have taken too much of this medicine contact a poison control center or emergency room at once. NOTE: This medicine is only for you. Do not share this medicine with others. What if I miss a dose? It is important not to miss a dose. Call your doctor or health care professional if you are unable to keep an appointment. What may interact with this medicine? -doxorubicin -warfarin This list may not describe all possible interactions. Give your health care provider a  list of all the medicines, herbs, non-prescription drugs, or dietary supplements you use. Also tell them if you smoke, drink alcohol, or use illegal drugs. Some items may interact with your medicine. What should I watch for while using this medicine? Visit your doctor for checks on your progress. Report any side effects. Continue your course of treatment even though you feel ill unless your doctor tells you to stop. Call your doctor or health care professional for advice if you get a fever, chills or sore throat, or other symptoms of a cold or flu. Do not treat yourself. Try to avoid being around people who are sick. You may experience fever, chills and shaking during your first infusion. These effects are usually mild and can be treated with other medicines. Report any side effects during the infusion to your health care professional. Fever and chills usually do not happen with later infusions. Do not become pregnant while taking this medicine or for 7 months after stopping it. Women should inform their doctor if they wish to become pregnant or think they might be pregnant. Women of child-bearing potential will need to have a negative pregnancy test before starting this medicine. There is a potential  for serious side effects to an unborn child. Talk to your health care professional or pharmacist for more information. Do not breast-feed an infant while taking this medicine or for 7 months after stopping it. Women must use effective birth control with this medicine. What side effects may I notice from receiving this medicine? Side effects that you should report to your doctor or other health care professional as soon as possible: -breathing difficulties -chest pain or palpitations -cough -dizziness or fainting -fever or chills, sore throat -skin rash, itching or hives -swelling of the legs or ankles -unusually weak or tired Side effects that usually do not require medical attention (report to your  doctor or other health care professional if they continue or are bothersome): -loss of appetite -headache -muscle aches -nausea This list may not describe all possible side effects. Call your doctor for medical advice about side effects. You may report side effects to FDA at 1-800-FDA-1088. Where should I keep my medicine? This drug is given in a hospital or clinic and will not be stored at home. NOTE: This sheet is a summary. It may not cover all possible information. If you have questions about this medicine, talk to your doctor, pharmacist, or health care provider.    2016, [employer] Standard. (2014-08-17 11:49:32)

## 2015-12-30 ENCOUNTER — Encounter: Payer: Self-pay | Admitting: Oncology

## 2015-12-30 ENCOUNTER — Telehealth: Payer: Self-pay

## 2015-12-30 NOTE — Progress Notes (Signed)
form left in pod 12/20/15- lef for dr Jana Hakim to sign- I adv patient forms ready for pck up. copy to medical [facility]

## 2015-12-30 NOTE — Progress Notes (Signed)
Patient advised she wanted forms mailed to her- not faxed.

## 2015-12-31 ENCOUNTER — Telehealth: Payer: Self-pay | Admitting: *Deleted

## 2015-12-31 NOTE — Telephone Encounter (Signed)
Pt call to confirm and discuss her future appts.

## 2016-01-02 ENCOUNTER — Telehealth: Payer: Self-pay | Admitting: Oncology

## 2016-01-02 NOTE — Telephone Encounter (Signed)
Per 8/4 pof added flush appointments to each lab. Left message for patient re add on flush appointments and confirmed next appointment for 8/11 @ 11:45 am. Patient to get new schedule when she comes in 8/11.

## 2016-01-03 ENCOUNTER — Ambulatory Visit: Payer: BC Managed Care – PPO

## 2016-01-03 ENCOUNTER — Ambulatory Visit (HOSPITAL_BASED_OUTPATIENT_CLINIC_OR_DEPARTMENT_OTHER): Payer: BC Managed Care – PPO

## 2016-01-03 ENCOUNTER — Encounter: Payer: Self-pay | Admitting: *Deleted

## 2016-01-03 ENCOUNTER — Other Ambulatory Visit (HOSPITAL_BASED_OUTPATIENT_CLINIC_OR_DEPARTMENT_OTHER): Payer: BC Managed Care – PPO

## 2016-01-03 ENCOUNTER — Encounter: Payer: Self-pay | Admitting: Oncology

## 2016-01-03 ENCOUNTER — Ambulatory Visit (HOSPITAL_BASED_OUTPATIENT_CLINIC_OR_DEPARTMENT_OTHER): Payer: BC Managed Care – PPO | Admitting: Oncology

## 2016-01-03 VITALS — BP 115/69 | HR 72 | Temp 98.6°F | Resp 18 | Ht 67.5 in | Wt 154.4 lb

## 2016-01-03 DIAGNOSIS — C50412 Malignant neoplasm of upper-outer quadrant of left female breast: Secondary | ICD-10-CM

## 2016-01-03 DIAGNOSIS — Z5111 Encounter for antineoplastic chemotherapy: Secondary | ICD-10-CM | POA: Diagnosis not present

## 2016-01-03 LAB — COMPREHENSIVE METABOLIC PANEL
ALT: 21 U/L (ref 0–55)
AST: 21 U/L (ref 5–34)
Albumin: 3.7 g/dL (ref 3.5–5.0)
Alkaline Phosphatase: 63 U/L (ref 40–150)
Anion Gap: 8 mEq/L (ref 3–11)
BILIRUBIN TOTAL: 0.48 mg/dL (ref 0.20–1.20)
BUN: 21.2 mg/dL (ref 7.0–26.0)
CHLORIDE: 103 meq/L (ref 98–109)
CO2: 27 meq/L (ref 22–29)
Calcium: 9.4 mg/dL (ref 8.4–10.4)
Creatinine: 0.8 mg/dL (ref 0.6–1.1)
EGFR: 83 mL/min/{1.73_m2} — AB (ref >90)
GLUCOSE: 97 mg/dL (ref 70–140)
Potassium: 4.1 mEq/L (ref 3.5–5.1)
SODIUM: 138 meq/L (ref 136–145)
TOTAL PROTEIN: 7.1 g/dL (ref 6.4–8.3)

## 2016-01-03 LAB — CBC WITH DIFFERENTIAL/PLATELET
BASO%: 0.2 % (ref 0.0–2.0)
Basophils Absolute: 0 10*3/uL (ref 0.0–0.1)
EOS ABS: 0.1 10*3/uL (ref 0.0–0.5)
EOS%: 1.4 % (ref 0.0–7.0)
HCT: 34.2 % — ABNORMAL LOW (ref 34.8–46.6)
HGB: 12 g/dL (ref 11.6–15.9)
LYMPH%: 33.3 % (ref 14.0–49.7)
MCH: 30.1 pg (ref 25.1–34.0)
MCHC: 35.1 g/dL (ref 31.5–36.0)
MCV: 85.7 fL (ref 79.5–101.0)
MONO#: 0.2 10*3/uL (ref 0.1–0.9)
MONO%: 3.9 % (ref 0.0–14.0)
NEUT%: 61.2 % (ref 38.4–76.8)
NEUTROS ABS: 2.7 10*3/uL (ref 1.5–6.5)
Platelets: 185 10*3/uL (ref 145–400)
RBC: 3.99 10*6/uL (ref 3.70–5.45)
RDW: 13.1 % (ref 11.2–14.5)
WBC: 4.4 10*3/uL (ref 3.9–10.3)
lymph#: 1.5 10*3/uL (ref 0.9–3.3)

## 2016-01-03 MED ORDER — HEPARIN SOD (PORK) LOCK FLUSH 100 UNIT/ML IV SOLN
500.0000 [IU] | Freq: Once | INTRAVENOUS | Status: AC | PRN
  Administered 2016-01-03: 500 [IU]
  Filled 2016-01-03: qty 5

## 2016-01-03 MED ORDER — SODIUM CHLORIDE 0.9% FLUSH
10.0000 mL | INTRAVENOUS | Status: DC | PRN
  Administered 2016-01-03: 10 mL via INTRAVENOUS
  Filled 2016-01-03: qty 10

## 2016-01-03 MED ORDER — FAMOTIDINE IN NACL 20-0.9 MG/50ML-% IV SOLN
INTRAVENOUS | Status: AC
  Filled 2016-01-03: qty 50

## 2016-01-03 MED ORDER — SODIUM CHLORIDE 0.9 % IV SOLN
80.0000 mg/m2 | Freq: Once | INTRAVENOUS | Status: AC
  Administered 2016-01-03: 144 mg via INTRAVENOUS
  Filled 2016-01-03: qty 24

## 2016-01-03 MED ORDER — SODIUM CHLORIDE 0.9 % IV SOLN
10.0000 mg | Freq: Once | INTRAVENOUS | Status: AC
  Administered 2016-01-03: 10 mg via INTRAVENOUS
  Filled 2016-01-03: qty 1

## 2016-01-03 MED ORDER — PACLITAXEL CHEMO INJECTION 300 MG/50ML: 80.0000 mg/m2 | Freq: Once | INTRAVENOUS | Status: DC

## 2016-01-03 MED ORDER — SODIUM CHLORIDE 0.9% FLUSH
10.0000 mL | INTRAVENOUS | Status: DC | PRN
Start: 2016-01-03 — End: 2016-01-03
  Administered 2016-01-03: 10 mL
  Filled 2016-01-03: qty 10

## 2016-01-03 MED ORDER — LORAZEPAM 0.5 MG PO TABS: 0.5000 mg | ORAL_TABLET | Freq: Three times a day (TID) | ORAL | 0 refills | Status: DC | PRN

## 2016-01-03 MED ORDER — SODIUM CHLORIDE 0.9 % IV SOLN
Freq: Once | INTRAVENOUS | Status: AC
  Administered 2016-01-03: 14:00:00 via INTRAVENOUS

## 2016-01-03 MED ORDER — FAMOTIDINE IN NACL 20-0.9 MG/50ML-% IV SOLN
20.0000 mg | Freq: Once | INTRAVENOUS | Status: AC
  Administered 2016-01-03: 20 mg via INTRAVENOUS

## 2016-01-03 NOTE — Patient Instructions (Signed)

## 2016-01-03 NOTE — Progress Notes (Signed)
[place]  Telephone:(336) [phone] Fax:(336) [phone]     ID: RINI MOFFIT DOB: [birth year]  MR#: [id number]  GQB#:[id number]  Patient Care Team: Darrol Jump, PA-C as PCP - General (Family Medicine) Fanny Skates, MD as Consulting Physician (General Surgery) Chauncey Cruel, MD as Consulting Physician (Oncology) Kyung Rudd, MD as Consulting Physician (Radiation Oncology) Larey Dresser, MD as Consulting Physician (Cardiology) Irene Limbo, MD as Consulting Physician (Plastic Surgery) OTHER MD:  CHIEF COMPLAINT: Triple positive breast cancer  CURRENT TREATMENT: Paclitaxel, trastuzumab, and pertuzumab   BREAST CANCER HISTORY: From the original intake note:  "Margie" had screening mammography in June 2017 showing upper outer quadrant calcifications. Accordingly she was referred for left diagnostic mammography and ultrasonography, performed 11/27/2015. The breast density was category C. Compression views of the left breast found pleomorphic calcifications in the upper outer quadrant measuring up to 4.0 cm. There was an area of associated architectural distortion in the upper outer quadrant as well. On physical exam there was an irregular thickening in the left breast at the 2:00 position anteriorly. Ultrasonography confirmed a hypoechoic irregular mass measuring 1.6 cm in the 2:00 position 2 cm from the nipple. An intramammary lymph node was noted with benign appearing. In the left axilla there was a lymph node with focally thickened cortex.  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 Biopsy of the left axillary lymph node was negative.  Her subsequent  history is as detailed below  INTERVAL HISTORY: Lesleigh Noe returns today accompanied by her husband [name] for follow-up of her estrogen receptor positive breast cancer. Today is day 1 cycle 2 of Taxol which she will receive weekly 12. Today is also day 8 cycle 1 of trastuzumab and pertuzumab which she will receive every 21 days during the Taxol treatments.  REVIEW OF SYSTEMS: Lesleigh Noe did remarkably well with her first treatment. The biggest problem she had was a sense of shakiness and irritability. This was accompanied by a slight bump in her heart rate. She didn't have fevers, rash, bleeding, nausea or vomiting. She has had no hair loss so far and no taste alteration or loss of appetite. In fact she was very hungry for 2 days after chemotherapy. She denies insomnia. She has had no diarrhea so far, in fact she has been slightly constipated. Aside from fatigue, a detailed review of systems today was otherwise stable  PAST MEDICAL HISTORY: Past Medical History:  Diagnosis Date  . Breast cancer ([facility])   . Breast cancer of upper-outer quadrant of left female breast ([facility]) 12/05/2015  . Chest pain   . Family history of premature CAD   . HLD (hyperlipidemia)     PAST SURGICAL HISTORY: Past Surgical History:  Procedure Laterality Date  . ANTERIOR CRUCIATE LIGAMENT REPAIR     right knee  . CESAREAN SECTION     x4  . GANGLION CYST EXCISION    . LAPAROSCOPIC ENDOMETRIOSIS FULGURATION    . PARTIAL HYSTERECTOMY    . PORTACATH PLACEMENT Right 12/25/2015   Procedure: INSERTION PORT-A-CATH WITH [place];  Surgeon: Fanny Skates, MD;  Location: [facility];  Service: General;  Laterality: Right;  . TONSILLECTOMY    . WISDOM TOOTH EXTRACTION      FAMILY  HISTORY Family History  Problem Relation Age of Onset  . Other Mother     [place]ine Meningioma  . Colon cancer Father   . Cancer    . Heart attack    . Ovarian cancer Sister   . Breast cancer Sister   . Other Paternal Aunt     Peritoneal cancer  . Cancer  Paternal Uncle   . Ovarian cancer Sister   . Breast cancer Sister   . Breast cancer Cousin   . Ovarian cancer Cousin   . Ovarian cancer Cousin   . Breast cancer Cousin   . Breast cancer Cousin     GYNECOLOGIC HISTORY:  No LMP recorded. Patient is postmenopausal. Menarche age 60, first live birth age 60. The patient is GX P4. She stopped having periods in 1989. She did not use hormone replacement. She used oral contraceptives less than one year.  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 Her husband [name] is retired from a [employer]. Son [name] lives in [place] and works as a Chief Strategy Officer. Son [name] lives in [place] and is with the [employer]. Son [name] this and cocoa [place] where he works as an Chief Financial Officer. Son [name] lives in [place] were he works as an Chief Financial Officer. The patient has 4 grandchildren including a 2-year-old they have custody of and lives with them. The patient attends our [employer]   HEALTH MAINTENANCE: Social History  Substance Use Topics  . Smoking status: Former Smoker    Years: 0.80    Types: Cigarettes    Quit date: 05/25/1974  . Smokeless tobacco: Never Used  . Alcohol use 0.5 oz/week    1 Standard drinks or equivalent per week     Colonoscopy:  PAP:  Bone density:   Allergies  Allergen Reactions  . Morphine And Related   . Vicodin [Hydrocodone-Acetaminophen]     Current Outpatient Prescriptions  Medication Sig Dispense Refill  . Cetirizine HCl 10 MG CAPS Take 10 mg by mouth.    Marland Kitchen acetaminophen (TYLENOL) 325 MG tablet Take 2 tablets (650 mg total) by mouth every 4 (four) hours as needed for mild pain (or Fever >/= 101).    Marland Kitchen acetaminophen-codeine (TYLENOL #3) 300-30 MG tablet Take 1-2 tablets by mouth every 4 (four) hours as needed for moderate pain. 40 tablet 0  . aspirin 81 MG tablet Take 81 mg by mouth daily.    Marland Kitchen atorvastatin (LIPITOR) 40  MG tablet Take 40 mg by mouth daily.  5  . etodolac (LODINE) 400 MG tablet Take 400 mg by mouth 2 (two) times daily as needed for mild pain.   2  . FIBER COMPLETE PO Take by mouth daily.      Marland Kitchen lidocaine-prilocaine (EMLA) cream Apply to affected area once 30 g 3  . LORazepam (ATIVAN) 0.5 MG tablet Take 1 tablet (0.5 mg total) by mouth every 8 (eight) hours as needed for anxiety. 30 tablet 0  . Multiple Vitamin (MULTIVITAMIN) tablet Take 1 tablet by mouth daily.      . ondansetron (ZOFRAN) 8 MG tablet Take 1 tablet (8 mg total) by mouth 2 (two) times daily as needed (Nausea or vomiting). 30 tablet 1  . Probiotic Product (ALIGN PO) Take by mouth.    . prochlorperazine (COMPAZINE) 10 MG tablet Take 1 tablet (10 mg total) by mouth every 6 (six) hours as needed (Nausea or vomiting). Cobb  tablet 1  . Vitamins C E (CRANBERRY CONCENTRATE PO) Take by mouth daily.       No current facility-administered medications for this visit.    Facility-Administered Medications Ordered in Other Visits  Medication Dose Route Frequency Provider Last Rate Last Dose  . heparin lock flush 100 unit/mL  500 Units Intracatheter Once PRN Chauncey Cruel, MD      . PACLitaxel (TAXOL) 144 mg in sodium chloride 0.9 % 250 mL chemo infusion (</= 42m/m2)  80 mg/m2 (Treatment Plan Recorded) Intravenous Once GChauncey Cruel MD 274 mL/hr at 01/03/16 1435 144 mg at 01/03/16 1435  . sodium chloride flush (NS) 0.9 % injection 10 mL  10 mL Intracatheter PRN GChauncey Cruel MD   10 mL at 12/27/15 1630  . sodium chloride flush (NS) 0.9 % injection 10 mL  10 mL Intracatheter PRN GChauncey Cruel MD        OBJECTIVE: Middle-aged white woman in no acute distress Vitals:   01/03/16 1227  BP: 115/69  Pulse: 72  Resp: 18  Temp: 98.6 F (37 C)     Body mass index is 23.83 kg/m.    ECOG FS:1 - Symptomatic but completely ambulatory  Sclerae unicteric, EOMs intact Oropharynx clear and moist No cervical or supraclavicular  adenopathy Lungs no rales or rhonchi Heart regular rate and rhythm Abd soft, nontender, positive bowel sounds MSK no focal spinal tenderness, no upper extremity lymphedema Neuro: nonfocal, well oriented, appropriate affect Breasts: The right breast shows no suspicious findings. The left breast is status post biopsy, and I do not palpate a mass today in the upper-outer quadrants. What I felt previously may have been related to the biopsy. The left axilla is benign.  LAB RESULTS:  CMP     Component Value Date/Time   NA 138 01/03/2016 1209   K 4.1 01/03/2016 1209   CO2 27 01/03/2016 1209   GLUCOSE 97 01/03/2016 1209   BUN 21.2 01/03/2016 1209   CREATININE 0.8 01/03/2016 1209   CALCIUM 9.4 01/03/2016 1209   PROT 7.1 01/03/2016 1209   ALBUMIN 3.7 01/03/2016 1209   AST 21 01/03/2016 1209   ALT 21 01/03/2016 1209   ALKPHOS 63 01/03/2016 1209   BILITOT 0.48 01/03/2016 1209    INo results found for: SPEP, UPEP  Lab Results  Component Value Date   WBC 4.4 01/03/2016   NEUTROABS 2.7 01/03/2016   HGB 12.0 01/03/2016   HCT 34.2 (L) 01/03/2016   MCV 85.7 01/03/2016   PLT 185 01/03/2016      Chemistry      Component Value Date/Time   NA 138 01/03/2016 1209   K 4.1 01/03/2016 1209   CO2 27 01/03/2016 1209   BUN 21.2 01/03/2016 1209   CREATININE 0.8 01/03/2016 1209      Component Value Date/Time   CALCIUM 9.4 01/03/2016 1209   ALKPHOS 63 01/03/2016 1209   AST 21 01/03/2016 1209   ALT 21 01/03/2016 1209   BILITOT 0.48 01/03/2016 1209       No results found for: LABCA2  No components found for: LABCA125  No results for input(s): INR in the last 168 hours.  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 She also had a questionable left  axillary lymph node biopsied which was negative for malignancy. LABS:  Not applicable. EXAM: BILATERAL BREAST MRI WITH AND WITHOUT CONTRAST TECHNIQUE: Multiplanar, multisequence MR images of both breasts were obtained prior to and following the intravenous administration of 14 ml of MultiHance. THREE-DIMENSIONAL MR IMAGE RENDERING ON INDEPENDENT WORKSTATION: Three-dimensional MR images were rendered by post-processing of the original MR data on an independent workstation. The three-dimensional MR images were interpreted, and findings are reported in the following complete MRI report for this study. Three dimensional images were evaluated at the independent DynaCad workstation COMPARISON:  No prior MRI available for comparison. FINDINGS: Breast composition: c. Heterogeneous fibroglandular tissue. Background parenchymal enhancement: Mild. Right breast: There is a 6 mm bilobed enhancing mass in the lower outer quadrant of the right breast, middle depth with suspicious washout kinetics (series 6, image 97). No other enhancing masses or suspicious areas of non mass enhancement are seen in the right breast. Left breast: Susceptibility artifact is seen in the slightly lateral left breast, middle depth corresponding with the biopsy marking clip of the stereotactic biopsy. There is extensive homogeneous non mass enhancement centrally in the left breast which extends the entire anterior to posterior dimension measuring 6 x 3 x 3 cm. There is question of asymmetric enhancement of the left nipple. No evidence of enhancement of the left pectoralis muscle. Lymph nodes: No abnormal appearing lymph nodes. Susceptibility artifact from the benign left axillary lymph node biopsy is identified. Ancillary findings:  None. IMPRESSION: 1. Abnormal extensive non mass enhancement centrally in the left breast spans at least 6 cm. There is concern for involvement  of the left nipple as well. 2. There is a 6 mm enhancing mass in the lower outer quadrant of the right breast with suspicious kinetics. RECOMMENDATION: MRI guided biopsy is recommended for the 6 mm enhancing mass in the right breast. BI-RADS CATEGORY  4: Suspicious. Electronically Signed   By: Ammie Ferrier M.D.   On: 12/12/2015 16:14   Dg Chest Port 1 View  Result Date: 12/25/2015 CLINICAL DATA:  Post Port-A-Cath placement, breast cancer EXAM: PORTABLE CHEST 1 VIEW COMPARISON:  Portable exam 1155 hours without priors for comparison FINDINGS: RIGHT subclavian Port-A-Cath with tip projecting over SVC. Normal heart size, mediastinal contours, and pulmonary vascularity. Lungs clear. No pleural effusion or pneumothorax. Goal bones unremarkable. IMPRESSION: No pneumothorax following RIGHT subclavian power port insertion. Electronically Signed   By: Lavonia Dana M.D.   On: 12/25/2015 12:16   Mm Digital Diagnostic Unilat R  Result Date: 12/26/2015 CLINICAL DATA:  Post biopsy mammogram of the right breast for clip placement. EXAM: DIAGNOSTIC RIGHT MAMMOGRAM POST MRI BIOPSY COMPARISON:  Previous exam(s). FINDINGS: Mammographic images were obtained following MRI guided biopsy of a right breast mass in the lower outer quadrant. The dumbbell-shaped biopsy marking clip is appropriately positioned at the site of biopsy of the lower outer right breast. IMPRESSION: Appropriate positioning of the dumbbell-shaped biopsy marking clip in the lower outer right breast. Final Assessment: Post Procedure Mammograms for Marker Placement Electronically Signed   By: Ammie Ferrier M.D.   On: 12/26/2015 09:30   Dg C-arm 1-60 Min-no Report  Result Date: 12/25/2015 CLINICAL DATA: surgery C-ARM 1-60 MINUTES Fluoroscopy was utilized by the requesting physician.  No radiographic interpretation.   Mr Rt Breast Bx Johnella Moloney Dev 1st Lesion Image Bx Spec Mr Guide  Addendum Date: 12/30/2015   ADDENDUM REPORT: 12/27/2015 15:38 ADDENDUM:  Pathology revealed FIBROCYSTIC CHANGES of the lower outer quadrant of the Right breast.  This was found to be concordant by Dr. Ammie Ferrier. Pathology results were discussed with the patient by telephone. The patient reported doing well after the biopsy with tenderness at the site. Post biopsy instructions and care were reviewed and questions were answered. The patient was encouraged to call [facility] for any additional concerns. The patient has a recent diagnosis of Left breast cancer and should follow her outlined treatment plan. A 6 month follow up MRI is recommended if the patient does not proceed with planned bilateral mastectomy (for known left breast cancer). Pathology results reported by Terie Purser, RN on 12/27/2015. Electronically Signed   By: Ammie Ferrier M.D.   On: 12/27/2015 15:38   Result Date: 12/30/2015 CLINICAL DATA:  60 year old female presenting for biopsy of a right breast mass. 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 EXAM: MRI GUIDED CORE NEEDLE BIOPSY OF THE RIGHT BREAST TECHNIQUE: Multiplanar, multisequence MR imaging of the right breast was performed both before and after administration of intravenous contrast. CONTRAST:  14 mg of MultiHance. COMPARISON:  Prior exams. FINDINGS: I met with the patient, and we discussed the procedure of MRI guided biopsy, including risks, benefits, and alternatives. Specifically, we discussed the risks of infection, bleeding, tissue injury, clip migration, and inadequate sampling. Informed, written consent was given. The usual time out protocol was performed immediately prior to the procedure. Using sterile technique, 1% Lidocaine, MRI guidance, and a 9 gauge vacuum assisted device, biopsy was performed of a small mass in the lower outer quadrant of the right breast using a lateral approach. At the conclusion of the procedure, a dumbbell shaped tissue marker clip was deployed  into the biopsy cavity. Follow-up 2-view mammogram was performed and dictated separately. IMPRESSION: MRI guided biopsy of a small mass in the lower outer quadrant of the right breast. No apparent complications. Electronically Signed: By: Ammie Ferrier M.D. On: 12/26/2015 09:31     ELIGIBLE FOR AVAILABLE RESEARCH PROTOCOL: no  ASSESSMENT: 60 y.o. [place], [place] woman status post left breast upper outer quadrant biopsy 12/02/2015 for a clinical T1c pN0, stage IA  invasive ductal carcinoma, grade 3, estrogen and progesterone receptor positive, with an MIB-1 of 15%, and HER-2 amplification  (a) biopsy of a 0.6 cm lower outer quadrant right breast mass scheduled for 12/26/2015  (1) neoadjuvant chemotherapy will consist of weekly paclitaxel 12 with trastuzumab and pertuzumab every 21 days 4 starting 12/27/2015  (2) trastuzumab to be continued to total of one year  (a) echo 12/16/2015 showed an ejection fraction in the 55-60% range.  (3) definitive surgery to follow chemotherapy  (4) adjuvant radiation to follow as appropriate  (5) anti-estrogens to follow at the completion of local treatment  (6) genetics testing scheduled for 01/09/2016     PLAN: Even though Margie did generally very well, we spent well over 30 minutes going over her multiple estrogens. I think the shakiness she experienced may be related to medications, but it is difficult to tell which. She did not have any Compazine or steroids beyond premeds. She tells me that Benadryl ribs are up and in fact we're going to have to lower the Benadryl dose as a result. I am also dropping the steroid dose with this and subsequent cycles. I think if she had lorazepam available she might be able to get ahead of this sense of irritability and shakiness, which is very intermittent. She tells me she has taken Valium in the  past and has knocked her out. I suggested she use a half a 0.5 mg lorazepam tablet if she gets shaky feeling and see  how that works for her.  She has not had diarrhea so far but she may well-developed that next week and she is prepared with Imodium. She will also call us if that develops.  She's had a funny feeling on her scalp, which has resolved. So far she has not lost any hair. She understands that this may or may not happen with her current medications.  She is strongly considering bilateral mastectomies with reconstruction. There may be insurance issues related to this. She will let us know if we can help.  She is also wondering if she should have her ovaries removed whether or not she carries a deleterious mutation, since she has such a strong family history. I think that is not unreasonable. She does need to discuss that with a gynecologist, and we will place a referral for her.  Otherwise she will proceed to treatment today and then see me again next week for third dose of paclitaxel. She knows to call for any problems that may develop before then.   :Chauncey Cruel, MD   01/03/2016 2:53 PM Medical Oncology and Hematology [facility] [address] [place], [place] [address] Tel. [phone]    Fax. [phone]

## 2016-01-03 NOTE — Patient Instructions (Signed)
[name] Discharge Instructions for Patients Receiving Chemotherapy  Today you received the following chemotherapy agent: Taxol (paclitaxel)  To help prevent nausea and vomiting after your treatment, we encourage you to take your nausea medication as prescribed.   If you develop nausea and vomiting that is not controlled by your nausea medication, call the clinic.   BELOW ARE SYMPTOMS THAT SHOULD BE REPORTED IMMEDIATELY:  *FEVER GREATER THAN 100.5 F  *CHILLS WITH OR WITHOUT FEVER  NAUSEA AND VOMITING THAT IS NOT CONTROLLED WITH YOUR NAUSEA MEDICATION  *UNUSUAL SHORTNESS OF BREATH  *UNUSUAL BRUISING OR BLEEDING  TENDERNESS IN MOUTH AND THROAT WITH OR WITHOUT PRESENCE OF ULCERS  *URINARY PROBLEMS  *BOWEL PROBLEMS  UNUSUAL RASH Items with * indicate a potential emergency and should be followed up as soon as possible.  Feel free to call the clinic you have any questions or concerns. The clinic phone number is [phone].  Please show the [facility] at check-in to the Emergency Department and triage nurse.

## 2016-01-05 ENCOUNTER — Other Ambulatory Visit: Payer: Self-pay | Admitting: Oncology

## 2016-01-05 MED ORDER — METHYLPREDNISOLONE 4 MG PO TBPK: ORAL_TABLET | ORAL | 0 refills | Status: DC

## 2016-01-05 NOTE — Progress Notes (Signed)
Recall last night. She is having some uvular irritation and postnasal drip. I started her on Zyrtec and hot liquids and she is a little bit better but still having problems. I am adding a Medrol Dosepak today. She is having no fever no wheezing and no respiratory distress. She is in addition having some reflux. She is can start Prilosec for that.

## 2016-01-06 ENCOUNTER — Telehealth: Payer: Self-pay | Admitting: *Deleted

## 2016-01-06 ENCOUNTER — Other Ambulatory Visit: Payer: Self-pay | Admitting: *Deleted

## 2016-01-06 NOTE — Telephone Encounter (Signed)
This RN spoke with pt post receiving communication from [place] of call over the weekend.  Alexandra Mathis states overall improvement of symptoms of enlarged uvula- with continued post nasal drip. She states continued intermittent blood from nose but only on tissue or when she blows - not having actual " nose bleeds.  Above discussed with pt managing symptoms with OTC allergy meds and MD prescription for medrol dose pak.  No other needs at this time.

## 2016-01-08 ENCOUNTER — Encounter: Payer: Self-pay | Admitting: Genetic Counselor

## 2016-01-09 ENCOUNTER — Other Ambulatory Visit: Payer: BC Managed Care – PPO

## 2016-01-09 ENCOUNTER — Ambulatory Visit (HOSPITAL_BASED_OUTPATIENT_CLINIC_OR_DEPARTMENT_OTHER): Payer: BC Managed Care – PPO | Admitting: Genetic Counselor

## 2016-01-09 DIAGNOSIS — Z315 Encounter for genetic counseling: Secondary | ICD-10-CM | POA: Diagnosis not present

## 2016-01-09 DIAGNOSIS — Z809 Family history of malignant neoplasm, unspecified: Secondary | ICD-10-CM

## 2016-01-09 DIAGNOSIS — C50412 Malignant neoplasm of upper-outer quadrant of left female breast: Secondary | ICD-10-CM

## 2016-01-09 DIAGNOSIS — Z806 Family history of leukemia: Secondary | ICD-10-CM

## 2016-01-09 DIAGNOSIS — Z808 Family history of malignant neoplasm of other organs or systems: Secondary | ICD-10-CM

## 2016-01-09 DIAGNOSIS — Z803 Family history of malignant neoplasm of breast: Secondary | ICD-10-CM | POA: Diagnosis not present

## 2016-01-09 DIAGNOSIS — Z8489 Family history of other specified conditions: Secondary | ICD-10-CM

## 2016-01-09 DIAGNOSIS — Z8041 Family history of malignant neoplasm of ovary: Secondary | ICD-10-CM | POA: Diagnosis not present

## 2016-01-09 DIAGNOSIS — Z800 Family history of malignant neoplasm of digestive organs: Secondary | ICD-10-CM

## 2016-01-09 DIAGNOSIS — Z8049 Family history of malignant neoplasm of other genital organs: Secondary | ICD-10-CM

## 2016-01-10 ENCOUNTER — Encounter: Payer: Self-pay | Admitting: Genetic Counselor

## 2016-01-10 ENCOUNTER — Other Ambulatory Visit (HOSPITAL_BASED_OUTPATIENT_CLINIC_OR_DEPARTMENT_OTHER): Payer: BC Managed Care – PPO

## 2016-01-10 ENCOUNTER — Ambulatory Visit (HOSPITAL_BASED_OUTPATIENT_CLINIC_OR_DEPARTMENT_OTHER): Payer: BC Managed Care – PPO

## 2016-01-10 ENCOUNTER — Ambulatory Visit (HOSPITAL_BASED_OUTPATIENT_CLINIC_OR_DEPARTMENT_OTHER): Payer: BC Managed Care – PPO | Admitting: Oncology

## 2016-01-10 ENCOUNTER — Ambulatory Visit: Payer: BC Managed Care – PPO

## 2016-01-10 ENCOUNTER — Telehealth: Payer: Self-pay | Admitting: *Deleted

## 2016-01-10 VITALS — BP 134/71 | HR 80 | Temp 98.9°F | Resp 18 | Ht 67.5 in | Wt 155.9 lb

## 2016-01-10 DIAGNOSIS — Z95828 Presence of other vascular implants and grafts: Secondary | ICD-10-CM | POA: Insufficient documentation

## 2016-01-10 DIAGNOSIS — Z5111 Encounter for antineoplastic chemotherapy: Secondary | ICD-10-CM

## 2016-01-10 DIAGNOSIS — C50412 Malignant neoplasm of upper-outer quadrant of left female breast: Secondary | ICD-10-CM

## 2016-01-10 DIAGNOSIS — G629 Polyneuropathy, unspecified: Secondary | ICD-10-CM

## 2016-01-10 DIAGNOSIS — Z803 Family history of malignant neoplasm of breast: Secondary | ICD-10-CM | POA: Insufficient documentation

## 2016-01-10 DIAGNOSIS — Z8041 Family history of malignant neoplasm of ovary: Secondary | ICD-10-CM | POA: Insufficient documentation

## 2016-01-10 DIAGNOSIS — Z800 Family history of malignant neoplasm of digestive organs: Secondary | ICD-10-CM | POA: Insufficient documentation

## 2016-01-10 LAB — COMPREHENSIVE METABOLIC PANEL
ALT: 20 U/L (ref 0–55)
ANION GAP: 7 meq/L (ref 3–11)
AST: 18 U/L (ref 5–34)
Albumin: 3.7 g/dL (ref 3.5–5.0)
Alkaline Phosphatase: 66 U/L (ref 40–150)
BILIRUBIN TOTAL: 0.32 mg/dL (ref 0.20–1.20)
BUN: 12.4 mg/dL (ref 7.0–26.0)
CALCIUM: 9.4 mg/dL (ref 8.4–10.4)
CHLORIDE: 104 meq/L (ref 98–109)
CO2: 29 meq/L (ref 22–29)
CREATININE: 0.7 mg/dL (ref 0.6–1.1)
EGFR: >90 mL/min/{1.73_m2} (ref >90)
Glucose: 85 mg/dl (ref 70–140)
Potassium: 4.1 mEq/L (ref 3.5–5.1)
Sodium: 140 mEq/L (ref 136–145)
TOTAL PROTEIN: 7.1 g/dL (ref 6.4–8.3)

## 2016-01-10 LAB — CBC WITH DIFFERENTIAL/PLATELET
BASO%: 0.6 % (ref 0.0–2.0)
Basophils Absolute: 0 10*3/uL (ref 0.0–0.1)
EOS%: 1.3 % (ref 0.0–7.0)
Eosinophils Absolute: 0.1 10*3/uL (ref 0.0–0.5)
HEMATOCRIT: 36.4 % (ref 34.8–46.6)
HGB: 12.3 g/dL (ref 11.6–15.9)
LYMPH#: 1.4 10*3/uL (ref 0.9–3.3)
LYMPH%: 30.2 % (ref 14.0–49.7)
MCH: 30.1 pg (ref 25.1–34.0)
MCHC: 33.8 g/dL (ref 31.5–36.0)
MCV: 89 fL (ref 79.5–101.0)
MONO#: 0.3 10*3/uL (ref 0.1–0.9)
MONO%: 7.3 % (ref 0.0–14.0)
NEUT%: 60.6 % (ref 38.4–76.8)
NEUTROS ABS: 2.7 10*3/uL (ref 1.5–6.5)
PLATELETS: 242 10*3/uL (ref 145–400)
RBC: 4.09 10*6/uL (ref 3.70–5.45)
RDW: 14.1 % (ref 11.2–14.5)
WBC: 4.5 10*3/uL (ref 3.9–10.3)

## 2016-01-10 MED ORDER — FAMOTIDINE IN NACL 20-0.9 MG/50ML-% IV SOLN
INTRAVENOUS | Status: AC
  Filled 2016-01-10: qty 50

## 2016-01-10 MED ORDER — SODIUM CHLORIDE 0.9 % IV SOLN
Freq: Once | INTRAVENOUS | Status: AC
  Administered 2016-01-10: 10:00:00 via INTRAVENOUS

## 2016-01-10 MED ORDER — SODIUM CHLORIDE 0.9 % IV SOLN
4.0000 mg | Freq: Once | INTRAVENOUS | Status: AC
  Administered 2016-01-10: 4 mg via INTRAVENOUS
  Filled 2016-01-10: qty 0.4

## 2016-01-10 MED ORDER — HEPARIN SOD (PORK) LOCK FLUSH 100 UNIT/ML IV SOLN
500.0000 [IU] | Freq: Once | INTRAVENOUS | Status: AC | PRN
  Administered 2016-01-10: 500 [IU]
  Filled 2016-01-10: qty 5

## 2016-01-10 MED ORDER — SODIUM CHLORIDE 0.9 % IV SOLN
80.0000 mg/m2 | Freq: Once | INTRAVENOUS | Status: AC
  Administered 2016-01-10: 144 mg via INTRAVENOUS
  Filled 2016-01-10: qty 24

## 2016-01-10 MED ORDER — SODIUM CHLORIDE 0.9% FLUSH
10.0000 mL | INTRAVENOUS | Status: DC | PRN
  Administered 2016-01-10: 10 mL
  Filled 2016-01-10: qty 10

## 2016-01-10 MED ORDER — SODIUM CHLORIDE 0.9 % IJ SOLN
10.0000 mL | INTRAMUSCULAR | Status: DC | PRN
  Administered 2016-01-10: 10 mL via INTRAVENOUS
  Filled 2016-01-10: qty 10

## 2016-01-10 MED ORDER — FAMOTIDINE IN NACL 20-0.9 MG/50ML-% IV SOLN
20.0000 mg | Freq: Once | INTRAVENOUS | Status: AC
  Administered 2016-01-10: 20 mg via INTRAVENOUS

## 2016-01-10 NOTE — Telephone Encounter (Signed)
Spoke with Alexandra Mathis about two additional meningioma-related genes (NF2 and SMARCE1), if she is interested to adding to testing.  Discussed that it's a very low likelihood we would find anything since mutations are related to definite genetic syndromes for which features are not in her family.  However, I also do not believe it will affect cost much.  She is on board with adding these genes, as she would like as much information as possible.  Discussed that this would make the test order a 44-gene test now.

## 2016-01-10 NOTE — Telephone Encounter (Signed)
I have scheduled an override appt on 8/25. Scheduler notified

## 2016-01-10 NOTE — Progress Notes (Signed)
Pt port was swollen, no signs of infection, nor redness. Pt said there was no pain at port sight. Accessed site, blood flowed normally, port seemed to have went down after accessing. Pt still didn't complain of any pain beside the needle going in.

## 2016-01-10 NOTE — Patient Instructions (Signed)
[facility] Discharge Instructions for Patients Receiving Chemotherapy  Today you received the following chemotherapy agents Taxol   To help prevent nausea and vomiting after your treatment, we encourage you to take your nausea medication as directed.   If you develop nausea and vomiting that is not controlled by your nausea medication, call the clinic.   BELOW ARE SYMPTOMS THAT SHOULD BE REPORTED IMMEDIATELY:  *FEVER GREATER THAN 100.5 F  *CHILLS WITH OR WITHOUT FEVER  NAUSEA AND VOMITING THAT IS NOT CONTROLLED WITH YOUR NAUSEA MEDICATION  *UNUSUAL SHORTNESS OF BREATH  *UNUSUAL BRUISING OR BLEEDING  TENDERNESS IN MOUTH AND THROAT WITH OR WITHOUT PRESENCE OF ULCERS  *URINARY PROBLEMS  *BOWEL PROBLEMS  UNUSUAL RASH Items with * indicate a potential emergency and should be followed up as soon as possible.  Feel free to call the clinic you have any questions or concerns. The clinic phone number is [phone].  Please show the CHEMO ALERT CARD at check-in to the Emergency Department and triage nurse.   

## 2016-01-10 NOTE — Progress Notes (Signed)
Alexandra Mathis  [phone] [phone]     ID: Alexandra Mathis DOB: [birth year]  MR#: [id number]  WLN#:[id number]  Patient Care Team: Darrol Jump, PA-C as PCP - General (Family Medicine) Fanny Skates, MD as Consulting Physician (General Surgery) Chauncey Cruel, MD as Consulting Physician (Oncology) Kyung Rudd, MD as Consulting Physician (Radiation Oncology) Larey Dresser, MD as Consulting Physician (Cardiology) Irene Limbo, MD as Consulting Physician (Plastic Surgery) OTHER MD:  CHIEF COMPLAINT: Triple positive breast cancer  CURRENT TREATMENT: Paclitaxel, trastuzumab, and pertuzumab   BREAST CANCER HISTORY: From the original intake note:  "Alexandra Mathis" had screening mammography in June 2017 showing upper outer quadrant calcifications. Accordingly she was referred for left diagnostic mammography and ultrasonography, performed 11/27/2015. The breast density was category C. Compression views of the left breast found pleomorphic calcifications in the upper outer quadrant measuring up to 4.0 cm. There was an area of associated architectural distortion in the upper outer quadrant as well. On physical exam there was an irregular thickening in the left breast at the 2:00 position anteriorly. Ultrasonography confirmed a hypoechoic irregular mass measuring 1.6 cm in the 2:00 position 2 cm from the nipple. An intramammary lymph node was noted with benign appearing. In the left axilla there was a lymph node with focally thickened cortex.  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 Biopsy of the left axillary lymph node was negative.  Her subsequent  history is as detailed below  INTERVAL HISTORY: Alexandra Mathis returns today for follow-up of her triple positive breast cancer accompanied by her husband [name]. Today is day 1 cycle 3 of Taxol which she will receive weekly 12. Today is also day 15 cycle 1 of trastuzumab and pertuzumab which she will receive every 21 days during the Taxol treatments.  REVIEW OF SYSTEMS: Alexandra Mathis have some uvular inflammation. We put her on a Medrol Dosepak and that has resolved. She continues to have a postnasal drip. She is taking Zyrtec daily and also using a saline nasal spray. This makes her voice course. We thought this could also be due to reflux and she is now on omeprazole 20 mg daily. She met with genetics yesterday and has made a definitive decision she wanted bilateral mastectomies. She is also strongly considering bilateral salpingo-oophorectomy, but has yet to meet with her gynecologist. She is exercising 15 minutes twice a day on the elliptical. She went back to work yesterday. She enjoyed that. A detailed review of systems today was otherwise stable  PAST MEDICAL HISTORY: Past Medical History:  Diagnosis Date  . Breast cancer ([place])   . Breast cancer of upper-outer quadrant of left female breast ([place]) 12/05/2015  . Chest pain   . Family history of premature CAD   . HLD (hyperlipidemia)     PAST SURGICAL HISTORY: Past Surgical History:  Procedure Laterality Date  . ANTERIOR CRUCIATE LIGAMENT REPAIR     right knee  . CESAREAN SECTION     x4  . GANGLION CYST EXCISION    . LAPAROSCOPIC ENDOMETRIOSIS FULGURATION    . PARTIAL HYSTERECTOMY    . PORTACATH PLACEMENT Right 12/25/2015   Procedure: INSERTION PORT-A-CATH WITH [place];  Surgeon: Fanny Skates, MD;  Location: [facility];  Service:  General;  Laterality: Right;  . TONSILLECTOMY    . WISDOM TOOTH EXTRACTION      FAMILY HISTORY Family History  Problem Relation Age of Onset  . Other Mother     [place] Meningioma  . Colon cancer Father   . Cancer    .  Heart attack    . Ovarian cancer Sister   . Breast cancer Sister   . Other Paternal Aunt     Peritoneal cancer  . Cancer Paternal Uncle   . Ovarian cancer Sister   . Breast cancer Sister   . Breast cancer Cousin   . Ovarian cancer Cousin   . Ovarian cancer Cousin   . Breast cancer Cousin   . Breast cancer Cousin     GYNECOLOGIC HISTORY:  No LMP recorded. Patient is postmenopausal. Menarche age 62, first live birth age 59. The patient is GX P4. She stopped having periods in 1989. She did not use hormone replacement. She used oral contraceptives less than one year.  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 Her husband [name] is retired from a [employer]. Son [name] lives in [place] and works as a Chief Strategy Officer. Son [name] lives in [place] and is with the [employer]. Son [name] this and cocoa [place] where he works as an Chief Financial Officer. Son [name] lives in [place] were he works as an Chief Financial Officer. The patient has 4 grandchildren including a 69-year-old they have custody of and lives with them. The patient attends our [place]   HEALTH MAINTENANCE: Social History  Substance Use Topics  . Smoking status: Former Smoker    Years: 0.80    Types: Cigarettes    Quit date: 05/25/1974  . Smokeless tobacco: Never Used  . Alcohol use 0.5 oz/week    1 Standard drinks or equivalent per week     Colonoscopy:  PAP:  Bone density:   Allergies  Allergen Reactions  . Morphine And Related   . Vicodin [Hydrocodone-Acetaminophen]     Current Outpatient Prescriptions  Medication Sig Dispense Refill  . acetaminophen (TYLENOL) 325 MG tablet Take 2 tablets (650 mg total) by mouth every 4 (four) hours as needed for mild pain (or Fever >/= 101).    Marland Kitchen acetaminophen-codeine (TYLENOL #3) 300-30 MG tablet Take 1-2 tablets by mouth every 4 (four) hours as needed for moderate pain. 40 tablet 0  .  aspirin 81 MG tablet Take 81 mg by mouth daily.    Marland Kitchen atorvastatin (LIPITOR) 40 MG tablet Take 40 mg by mouth daily.  5  . Cetirizine HCl 10 MG CAPS Take 10 mg by mouth.    . etodolac (LODINE) 400 MG tablet Take 400 mg by mouth 2 (two) times daily as needed for mild pain.   2  . FIBER COMPLETE PO Take by mouth daily.      Marland Kitchen lidocaine-prilocaine (EMLA) cream Apply to affected area once 30 g 3  . LORazepam (ATIVAN) 0.5 MG tablet Take 1 tablet (0.5 mg total) by mouth every 8 (eight) hours as needed for anxiety. 30 tablet 0  . methylPREDNISolone (MEDROL DOSEPAK) 4 MG TBPK tablet Take as directed 21 tablet 0  . Multiple Vitamin (MULTIVITAMIN) tablet Take 1 tablet by mouth daily.      . ondansetron (ZOFRAN) 8 MG tablet Take 1 tablet (8 mg total) by mouth 2 (two) times daily as needed (Nausea or vomiting). 30 tablet 1  .  Probiotic Product (ALIGN PO) Take by mouth.    . prochlorperazine (COMPAZINE) 10 MG tablet Take 1 tablet (10 mg total) by mouth every 6 (six) hours as needed (Nausea or vomiting). 30 tablet 1  . Vitamins C E (CRANBERRY CONCENTRATE PO) Take by mouth daily.       No current facility-administered medications for this visit.    Facility-Administered Medications Ordered in Other Visits  Medication Dose Route Frequency Provider Last Rate Last Dose  . sodium chloride flush (NS) 0.9 % injection 10 mL  10 mL Intracatheter PRN Chauncey Cruel, MD   10 mL at 12/27/15 1630    OBJECTIVE: Middle-aged white woman in no acute distress Vitals:   01/10/16 0828  BP: 134/71  Pulse: 80  Resp: 18  Temp: 98.9 F (37.2 C)     Body mass index is 24.06 kg/m.    ECOG FS:1 - Symptomatic but completely ambulatory  Sclerae unicteric, EOMs intact Oropharynx clear and moist No cervical or supraclavicular adenopathy Lungs no rales or rhonchi Heart regular rate and rhythm Abd soft, nontender, positive bowel sounds MSK no focal spinal tenderness, no upper extremity lymphedema Neuro: nonfocal, well  oriented, appropriate affect Breasts: The right breast shows no suspicious findings. The left breast is status post biopsy, and I do not palpate a mass today in the upper-outer quadrants. What I felt previously may have been related to the biopsy. The left axilla is benign.  LAB RESULTS:  CMP     Component Value Date/Time   NA 138 01/03/2016 1209   K 4.1 01/03/2016 1209   CO2 27 01/03/2016 1209   GLUCOSE 97 01/03/2016 1209   BUN 21.2 01/03/2016 1209   CREATININE 0.8 01/03/2016 1209   CALCIUM 9.4 01/03/2016 1209   PROT 7.1 01/03/2016 1209   ALBUMIN 3.7 01/03/2016 1209   AST 21 01/03/2016 1209   ALT 21 01/03/2016 1209   ALKPHOS 63 01/03/2016 1209   BILITOT 0.48 01/03/2016 1209    INo results found for: SPEP, UPEP  Lab Results  Component Value Date   WBC 4.5 01/10/2016   NEUTROABS 2.7 01/10/2016   HGB 12.3 01/10/2016   HCT 36.4 01/10/2016   MCV 89.0 01/10/2016   PLT 242 01/10/2016      Chemistry      Component Value Date/Time   NA 138 01/03/2016 1209   K 4.1 01/03/2016 1209   CO2 27 01/03/2016 1209   BUN 21.2 01/03/2016 1209   CREATININE 0.8 01/03/2016 1209      Component Value Date/Time   CALCIUM 9.4 01/03/2016 1209   ALKPHOS 63 01/03/2016 1209   AST 21 01/03/2016 1209   ALT 21 01/03/2016 1209   BILITOT 0.48 01/03/2016 1209       No results found for: LABCA2  No components found for: LABCA125  No results for input(s): INR in the last 168 hours.  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 She also had a questionable left axillary lymph node biopsied which was negative for malignancy. LABS:  Not applicable. EXAM: BILATERAL BREAST MRI WITH AND  WITHOUT CONTRAST TECHNIQUE: Multiplanar, multisequence MR images of both breasts were obtained prior to and following the intravenous administration of 14 ml of MultiHance. THREE-DIMENSIONAL MR IMAGE RENDERING ON INDEPENDENT WORKSTATION: Three-dimensional  MR images were rendered by post-processing of the original MR data on an independent workstation. The three-dimensional MR images were interpreted, and findings are reported in the following complete MRI report for this study. Three dimensional images were evaluated at the independent DynaCad workstation COMPARISON:  No prior MRI available for comparison. FINDINGS: Breast composition: c. Heterogeneous fibroglandular tissue. Background parenchymal enhancement: Mild. Right breast: There is a 6 mm bilobed enhancing mass in the lower outer quadrant of the right breast, middle depth with suspicious washout kinetics (series 6, image 97). No other enhancing masses or suspicious areas of non mass enhancement are seen in the right breast. Left breast: Susceptibility artifact is seen in the slightly lateral left breast, middle depth corresponding with the biopsy marking clip of the stereotactic biopsy. There is extensive homogeneous non mass enhancement centrally in the left breast which extends the entire anterior to posterior dimension measuring 6 x 3 x 3 cm. There is question of asymmetric enhancement of the left nipple. No evidence of enhancement of the left pectoralis muscle. Lymph nodes: No abnormal appearing lymph nodes. Susceptibility artifact from the benign left axillary lymph node biopsy is identified. Ancillary findings:  None. IMPRESSION: 1. Abnormal extensive non mass enhancement centrally in the left breast spans at least 6 cm. There is concern for involvement of the left nipple as well. 2. There is a 6 mm enhancing mass in the lower outer quadrant of the right breast with suspicious kinetics. RECOMMENDATION: MRI guided biopsy is recommended for the 6 mm  enhancing mass in the right breast. BI-RADS CATEGORY  4: Suspicious. Electronically Signed   By: Ammie Ferrier M.D.   On: 12/12/2015 16:14   Dg Chest Port 1 View  Result Date: 12/25/2015 CLINICAL DATA:  Post Port-A-Cath placement, breast cancer EXAM: PORTABLE CHEST 1 VIEW COMPARISON:  Portable exam 1155 hours without priors for comparison FINDINGS: RIGHT subclavian Port-A-Cath with tip projecting over SVC. Normal heart size, mediastinal contours, and pulmonary vascularity. Lungs clear. No pleural effusion or pneumothorax. Goal bones unremarkable. IMPRESSION: No pneumothorax following RIGHT subclavian power port insertion. Electronically Signed   By: Lavonia Dana M.D.   On: 12/25/2015 12:16   Mm Digital Diagnostic Unilat R  Result Date: 12/26/2015 CLINICAL DATA:  Post biopsy mammogram of the right breast for clip placement. EXAM: DIAGNOSTIC RIGHT MAMMOGRAM POST MRI BIOPSY COMPARISON:  Previous exam(s). FINDINGS: Mammographic images were obtained following MRI guided biopsy of a right breast mass in the lower outer quadrant. The dumbbell-shaped biopsy marking clip is appropriately positioned at the site of biopsy of the lower outer right breast. IMPRESSION: Appropriate positioning of the dumbbell-shaped biopsy marking clip in the lower outer right breast. Final Assessment: Post Procedure Mammograms for Marker Placement Electronically Signed   By: Ammie Ferrier M.D.   On: 12/26/2015 09:30   Dg C-arm 1-60 Min-no Report  Result Date: 12/25/2015 CLINICAL DATA: surgery C-ARM 1-60 MINUTES Fluoroscopy was utilized by the requesting physician.  No radiographic interpretation.   Mr Rt Breast Bx Johnella Moloney Dev 1st Lesion Image Bx Spec Mr Guide  Addendum Date: 12/30/2015   ADDENDUM REPORT: 12/27/2015 15:38 ADDENDUM: Pathology revealed FIBROCYSTIC CHANGES of the lower outer quadrant of the Right breast. This was found to be concordant by Dr. Ammie Ferrier. Pathology results were discussed with the patient by  telephone. The patient reported doing well after the biopsy with tenderness at the site. Post biopsy instructions and care were reviewed and questions were answered. The patient was encouraged to call [facility] for any additional concerns. The patient has a recent diagnosis of Left breast cancer and should follow her outlined treatment plan. A 6 month follow up MRI is recommended if the patient does not proceed with planned bilateral mastectomy (for known left breast cancer). Pathology results reported by Terie Purser, RN on 12/27/2015. Electronically Signed   By: Ammie Ferrier M.D.   On: 12/27/2015 15:38   Result Date: 12/30/2015 CLINICAL DATA:  60 year old female presenting for biopsy of a right breast mass. 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 EXAM: MRI GUIDED CORE NEEDLE BIOPSY OF THE RIGHT BREAST TECHNIQUE: Multiplanar, multisequence MR imaging of the right breast was performed both before and after administration of intravenous contrast. CONTRAST:  14 mg of MultiHance. COMPARISON:  Prior exams. FINDINGS: I met with the patient, and we discussed the procedure of MRI guided biopsy, including risks, benefits, and alternatives. Specifically, we discussed the risks of infection, bleeding, tissue injury, clip migration, and inadequate sampling. Informed, written consent was given. The usual time out protocol was performed immediately prior to the procedure. Using sterile technique, 1% Lidocaine, MRI guidance, and a 9 gauge vacuum assisted device, biopsy was performed of a small mass in the lower outer quadrant of the right breast using a lateral approach. At the conclusion of the procedure, a dumbbell shaped tissue marker clip was deployed into the biopsy cavity. Follow-up 2-view mammogram was performed and dictated separately. IMPRESSION: MRI guided biopsy of a small mass in the lower outer quadrant of the right breast. No apparent  complications. Electronically Signed: By: Ammie Ferrier M.D. On: 12/26/2015 09:31     ELIGIBLE FOR AVAILABLE RESEARCH PROTOCOL: no  ASSESSMENT: 60 y.o. [place], [place] woman status post left breast upper outer quadrant biopsy 12/02/2015 for a clinical T1c pN0, stage IA  invasive ductal carcinoma, grade 3, estrogen and progesterone receptor positive, with an MIB-1 of 15%, and HER-2 amplification  (a) biopsy of a 0.6 cm lower outer quadrant right breast mass scheduled for 12/26/2015  (1) neoadjuvant chemotherapy will consist of weekly paclitaxel 12 with trastuzumab and pertuzumab every 21 days 4 starting 12/27/2015  (2) trastuzumab to be continued to total of one year  (a) echo 12/16/2015 showed an ejection fraction in the 55-60% range.  (3) definitive surgery to follow chemotherapy  (4) adjuvant radiation to follow as appropriate  (5) anti-estrogens to follow at the completion of local treatment  (6) genetics testing 01/09/2016-- results pending   PLAN: Alexandra Mathis is tolerating the Taxol quite well, and specifically has not had any peripheral neuropathy symptoms so 4. We are proceeding to the third of 12 planned doses today.  She met with genetics yesterday and after that discussion, even though we do not yet have test results, she has decided that she will have bilateral mastectomies with reconstruction. She has also met with plastics and that whole decision process is ongoing.  At some point she will need with gynecology. She is considering bilateral salpingo-oophorectomy as well. I don't have any problem with that decision either.  The one thing that she is having is persistent postnasal drip. This is doubtless due to the Taxol. She is already on Zyrtec and using saline nasal spray. I suggested she use Otrivin for a couple of days and see if that takes care of the problem. She understands she cannot use this more than 2 or most 3 days in a row before rebound occurs.  She will  now be on only 4 mg of Decadron premeds and hopefully that will help as well. The uvular inflammation that she had last week has completely resolved  I again encouraged her to practice "normal" as possible. She in particular enjoys working with horses. She is now back to work and enjoying that as well  She will see me again next week, before her fourth cycle of Taxol and her second of anti-HER-2 treatment. She knows to call for any problems that may develop before then.   :Chauncey Cruel, MD   01/10/2016 8:37 AM Medical Oncology and Hematology [facility] [place], [place] [address] Tel. [phone]    Fax. [phone]

## 2016-01-10 NOTE — Progress Notes (Signed)
REFERRING PROVIDER: Lurline Del, MD  PRIMARY PROVIDER:  Linard Millers  PRIMARY REASON FOR VISIT:  1. Breast cancer of upper-outer quadrant of left female breast ([place])   2. Family history of breast cancer in female   3. Family history of ovarian cancer   4. Family history of colon cancer   5. Family history of peritoneal cancer   6. Family history of brain tumor   7. Family history of carcinoid tumor   8. Family history of leukemia   9. Family history of thyroid cancer   10. Family history of cervical cancer   11. Family history of basal cell carcinoma      HISTORY OF PRESENT ILLNESS:   Alexandra Mathis, a 60 y.o. female, was seen for a [facility] cancer genetics consultation at the request of Dr. Jana Hakim due to a personal history of breast cancer and family history of breast, ovarian, and other cancers.  Alexandra Mathis presents to clinic today with her husband (and with her sister via phone) to discuss the possibility of a hereditary predisposition to cancer, genetic testing, and to further clarify her future cancer risks, as well as potential cancer risks for family members.   In July 2017, at the age of 10, Alexandra Mathis was diagnosed with invasive ductal carcinoma of the left breast.  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 This is currently being treated with neoadjuvant chemotherapy.  Genetic testing will help inform Alexandra Mathis's further surgical/treatment decisions.   HORMONAL RISK FACTORS:  Menarche was at age 31.  First live birth at age 74.  OCP use for approximately less than 1 year.  Ovaries intact: Yes.  Hysterectomy: yes - partial hysterectomy at age 79 for adenomyosis and endometriosis; still has ovaries.  Menopausal status: postmenopausal.  HRT use: 0 years. Colonoscopy: yes; most recent colonoscopy was 2-3 years ago; reports no history of polyps. Mammogram within the last year: was getting mammograms annually until recently. Number of breast  biopsies: 2. Up to date with pelvic exams:  yes. Any excessive radiation exposure/other exposures in the past:  Reports some history of secondhand smoke exposure  Past Medical History:  Diagnosis Date  . Breast cancer ([place])   . Breast cancer of upper-outer quadrant of left female breast ([place]) 12/05/2015  . Chest pain   . Family history of premature CAD   . HLD (hyperlipidemia)     Past Surgical History:  Procedure Laterality Date  . ANTERIOR CRUCIATE LIGAMENT REPAIR     right knee  . CESAREAN SECTION     x4  . GANGLION CYST EXCISION    . LAPAROSCOPIC ENDOMETRIOSIS FULGURATION    . PARTIAL HYSTERECTOMY    . PORTACATH PLACEMENT Right 12/25/2015   Procedure: INSERTION PORT-A-CATH WITH [place];  Surgeon: Fanny Skates, MD;  Location: [facility];  Service: General;  Laterality: Right;  . TONSILLECTOMY    . WISDOM TOOTH EXTRACTION      Social History   Social History  . Marital status: Married    Spouse name: N/A  . Number of children: 4  . Years of education: N/A   Occupational History  . teacher    Social History Main Topics  . Smoking status: Former Smoker    Years: 0.50    Types: Cigarettes    Quit date: 05/25/1974  . Smokeless tobacco: Never Used     Comment: was only a social/weekend smoker; smoked for 6 mos  . Alcohol use 0.5 oz/week    1 Standard drinks or equivalent  per week     Comment: maybe 2-3 drinks per month  . Drug use: No     Comment: last used 25 yrs ago  . Sexual activity: Not Asked   Other Topics Concern  . None   Social History Narrative  . None     FAMILY HISTORY:  We obtained a detailed, 4-generation family history.  Significant diagnoses are listed below: Family History  Problem Relation Age of Onset  . Other Mother     benign meningioma dx 1 s/p surgery; hx of hysterectomy for hemorrhaging  . Hypothyroidism Mother   . CAD Mother   . Colon cancer Father 53    w/ mets to lungs and liver; + chemo; d. 18  . Gout Father   . Arthritis Father    . Ovarian cancer Sister     early 24s; s/p USO  . Breast cancer Sister 44    s/p mastectomy and aromatase inhibitor  . Other Paternal Aunt 31    Primary peritoneal cancer; s/p surgery  . Cancer Paternal Uncle   . Ovarian cancer Sister 70    "pre-cancer"; s/p BSO  . Cervical cancer Sister   . Basal cell carcinoma Sister   . Colon polyps Sister     unspecified number  . Breast cancer Cousin 55    s/p mastectomy  . Ovarian cancer Cousin 102    s/p BSO  . Leukemia Cousin 15  . Cancer Cousin     dx carcinoid bowel tumor  . Other Cousin     reportedly negative "BRCA1/2 testing"  . Breast cancer Cousin 32    reportedly negative GT is 2014-2015  . CAD Maternal Grandmother     d. 90s  . Heart attack Maternal Grandfather 51  . Heart Problems Paternal Grandmother   . Heart Problems Paternal Grandfather   . CAD Brother     triple vessel CAD, d. 38  . Other Son     enlarged prostate w/ surveillance  . Thyroid cancer Maternal Uncle 71    unspecified type  . CAD Maternal Uncle     d. 25  . Other Cousin 48    dx benign "skin polyp"  . Breast cancer Other     paternal great aunt (PGM's sister)  . Breast cancer Other     paternal great aunt (PGM's sister)    Alexandra Mathis has four sons, ages 66-36.  None of her sons have ever been diagnosed with cancer, but one son (currently 16) has a history of enlarged prostate, which is being monitored by his doctor.  Alexandra Mathis has six full sisters and two full brothers.  One brother died of a heart attack at age 52.  The other brother is currently 20.  Three of Alexandra Mathis's sisters are 48-57 and have not had cancer.  Three of Alexandra Mathis's sisters have a history of cancer.  One sister (currently 24) was diagnosed with breast cancer at 2 and ovarian cancer in her early 63s.  She has one son and one daughter who have not been diagnosed with cancer.  Another sister, Alexandra Mathis, was diagnosed with a "pre-cancer" of her ovary at the age of 72; this was  treated with a BSO.  Another sister was diagnosed with a cervical cancer and a basal cell carcinoma skin cancer in her 40s-50s.  Alexandra Mathis reports no history of genetic testing in her siblings.  None of her nieces or nephews have ever been diagnosed with cancer.  Alexandra Mathis's mother died at the age of 72.  She had a history of benign meningioma, diagnosed at 39 and treated with surgery. Alexandra Mathis mother had three full brothers.  One brother died of CAD at 65.  This brother has four children, one of whom has a history of a benign "skin polyp" diagnosed in at the age of 64.  Another brother is currently 78 and has a history of thyroid cancer (unspecified type) diagnosed at 46.  The third brother is currently 48 and has never had cancer.  Alexandra Mathis reports no history of cancer in any of her maternal first cousins.  Her maternal grandmother died of CAD in her 57s.  Her maternal grandfather died of a heart attack at the age of 57.    Alexandra Mathis's father was diagnosed with colorectal cancer at the age of 33.  This cancer metastasized to his lungs and liver and he passed away at 55.  Alexandra Mathis's father had one full sister.  She passed away at 77 from a primary peritoneal cancer.  She had two daughters and two sons.  One of her daughters was diagnosed with breast cancer at 37.  This daughter has reportedly had negative genetic testing in 2014-2015.  The other daughter was diagnosed with breast cancer, ovarian cancer, leukemia, and a carcinoid bowel tumor, all around the age of 56.  She has reportedly had negative genetic testing as well.  Alexandra Mathis paternal grandmother died of heart issues in her 75s.  She had two sisters and a brother; both of her sisters had a history of breast cancer.  Alexandra Mathis paternal grandfather died of heart issues in his 24s.    Patient's maternal and paternal ancestors are of [place] descent. There is no reported Ashkenazi Jewish ancestry. There is no known  consanguinity.  GENETIC COUNSELING ASSESSMENT: Alexandra Mathis is a 60 y.o. female with a personal and family history which is somewhat suggestive of a hereditary breast/ovarian cancer syndrome and predisposition to cancer. We, therefore, discussed and recommended the following at today's visit.   DISCUSSION: We reviewed the characteristics, features and inheritance patterns of hereditary cancer syndromes, particularly those caused by mutations within the BRCA1/2 and Lynch syndrome genes. We also discussed genetic testing, including the appropriate family members to test, the process of testing, insurance coverage and turn-around-time for results. We discussed the implications of a negative, positive and/or variant of uncertain significant result. We recommended Alexandra Mathis pursue genetic testing for the 42-gene [facility] Common Hereditary Cancers Panel (Breast, Gyn, GI) through [facility] [place], [place]).  The 42-gene [facility] Common Hereditary Cancers Panel (Breast, Gyn, GI) performed by [facility], [place]) includes sequencing and/or deletion/duplication analysis for the following genes: APC, ATM, AXIN2, BARD1, BMPR1A, BRCA1, BRCA2, BRIP1, CDH1, CDKN2A, CHEK2, DICER1, EPCAM, GREM1, KIT, MEN1, MLH1, MSH2, MSH6, MUTYH, NBN, NF1, PALB2, PDGFRA, PMS2, POLD1, POLE, PTEN, RAD50, RAD51C, RAD51D, SDHA, SDHB, SDHC, SDHD, SMAD4, SMARCA4, STK11, TP53, TSC1, TSC2, and VHL.   Based on Ms. Mcentee's personal and family history of cancer, she meets medical criteria for genetic testing. Despite that she meets criteria, she may still have an out of pocket cost. We discussed that if her out of pocket cost for testing is over $100, the laboratory will call and confirm whether she wants to proceed with testing.  If the out of pocket cost of testing is less than $100 she will be billed by the genetic testing laboratory.   PLAN: After considering the  risks, benefits, and limitations, Ms.  Limes  provided informed consent to pursue genetic testing and the blood sample was sent to [facility] for analysis of the 42-gene [facility] Common Hereditary Cancers Panel (Breast, Gyn, GI). Results should be available within approximately 2-3 weeks' time, at which point they will be disclosed by telephone to Ms. Danek, as will any additional recommendations warranted by these results. Ms. Coggin will receive a summary of her genetic counseling visit and a copy of her results once available. This information will also be available in Epic. We encouraged Ms. Bowne to remain in contact with cancer genetics annually so that we can continuously update the family history and inform her of any changes in cancer genetics and testing that may be of benefit for her family. Ms. Seybold questions were answered to her satisfaction today. Our contact information was provided should additional questions or concerns arise.  Thank you for the referral and allowing [place] to share in the care of your patient.   Jeanine Luz, MS, [facility] Certified Genetic Counselor [email or web address] Phone: [phone]  The patient was seen for a total of 75 minutes in face-to-face genetic counseling.  This patient was discussed with Drs. Magrinat, Lindi Adie and/or Burr Medico who agrees with the above.    _______________________________________________________________________ For Office Staff:  Number of people involved in session: 3 Was an Intern/ student involved with case: no

## 2016-01-10 NOTE — Progress Notes (Signed)
Pt states that she was not able to tolerated Benadryl the first treatment, and did okay not receiving Benadryl last treatment. Per Dr. Jana Hakim okay to not give Benadryl and may be removed from treatment plan. Pharmacy aware.

## 2016-01-17 ENCOUNTER — Ambulatory Visit: Payer: BC Managed Care – PPO

## 2016-01-17 ENCOUNTER — Encounter: Payer: Self-pay | Admitting: *Deleted

## 2016-01-17 ENCOUNTER — Other Ambulatory Visit (HOSPITAL_BASED_OUTPATIENT_CLINIC_OR_DEPARTMENT_OTHER): Payer: BC Managed Care – PPO

## 2016-01-17 ENCOUNTER — Ambulatory Visit (HOSPITAL_BASED_OUTPATIENT_CLINIC_OR_DEPARTMENT_OTHER): Payer: BC Managed Care – PPO | Admitting: Oncology

## 2016-01-17 ENCOUNTER — Ambulatory Visit (HOSPITAL_BASED_OUTPATIENT_CLINIC_OR_DEPARTMENT_OTHER): Payer: BC Managed Care – PPO

## 2016-01-17 VITALS — BP 125/68 | HR 81 | Temp 98.3°F | Resp 18 | Wt 158.4 lb

## 2016-01-17 DIAGNOSIS — Z5112 Encounter for antineoplastic immunotherapy: Secondary | ICD-10-CM

## 2016-01-17 DIAGNOSIS — C50412 Malignant neoplasm of upper-outer quadrant of left female breast: Secondary | ICD-10-CM

## 2016-01-17 DIAGNOSIS — R0602 Shortness of breath: Secondary | ICD-10-CM | POA: Diagnosis not present

## 2016-01-17 DIAGNOSIS — Z5111 Encounter for antineoplastic chemotherapy: Secondary | ICD-10-CM

## 2016-01-17 LAB — CBC WITH DIFFERENTIAL/PLATELET
BASO%: 0.7 % (ref 0.0–2.0)
BASOS ABS: 0 10*3/uL (ref 0.0–0.1)
EOS%: 1 % (ref 0.0–7.0)
Eosinophils Absolute: 0 10*3/uL (ref 0.0–0.5)
HEMATOCRIT: 34.8 % (ref 34.8–46.6)
HEMOGLOBIN: 11.7 g/dL (ref 11.6–15.9)
LYMPH#: 1 10*3/uL (ref 0.9–3.3)
LYMPH%: 26 % (ref 14.0–49.7)
MCH: 29.8 pg (ref 25.1–34.0)
MCHC: 33.6 g/dL (ref 31.5–36.0)
MCV: 88.6 fL (ref 79.5–101.0)
MONO#: 0.3 10*3/uL (ref 0.1–0.9)
MONO%: 8.6 % (ref 0.0–14.0)
NEUT#: 2.4 10*3/uL (ref 1.5–6.5)
NEUT%: 63.7 % (ref 38.4–76.8)
Platelets: 208 10*3/uL (ref 145–400)
RBC: 3.93 10*6/uL (ref 3.70–5.45)
RDW: 14.4 % (ref 11.2–14.5)
WBC: 3.8 10*3/uL — ABNORMAL LOW (ref 3.9–10.3)

## 2016-01-17 LAB — COMPREHENSIVE METABOLIC PANEL
ALBUMIN: 3.4 g/dL — AB (ref 3.5–5.0)
ALK PHOS: 69 U/L (ref 40–150)
ALT: 25 U/L (ref 0–55)
AST: 21 U/L (ref 5–34)
Anion Gap: 9 mEq/L (ref 3–11)
BILIRUBIN TOTAL: 0.31 mg/dL (ref 0.20–1.20)
BUN: 14.5 mg/dL (ref 7.0–26.0)
CALCIUM: 9.5 mg/dL (ref 8.4–10.4)
CO2: 26 mEq/L (ref 22–29)
CREATININE: 0.7 mg/dL (ref 0.6–1.1)
Chloride: 107 mEq/L (ref 98–109)
EGFR: >90 mL/min/{1.73_m2} (ref >90)
Glucose: 96 mg/dl (ref 70–140)
Potassium: 4.3 mEq/L (ref 3.5–5.1)
Sodium: 142 mEq/L (ref 136–145)
TOTAL PROTEIN: 6.9 g/dL (ref 6.4–8.3)

## 2016-01-17 MED ORDER — ACETAMINOPHEN 325 MG PO TABS
650.0000 mg | ORAL_TABLET | Freq: Once | ORAL | Status: AC
  Administered 2016-01-17: 650 mg via ORAL

## 2016-01-17 MED ORDER — HEPARIN SOD (PORK) LOCK FLUSH 100 UNIT/ML IV SOLN
500.0000 [IU] | Freq: Once | INTRAVENOUS | Status: AC | PRN
  Administered 2016-01-17: 500 [IU]
  Filled 2016-01-17: qty 5

## 2016-01-17 MED ORDER — FAMOTIDINE IN NACL 20-0.9 MG/50ML-% IV SOLN
20.0000 mg | Freq: Once | INTRAVENOUS | Status: AC
  Administered 2016-01-17: 20 mg via INTRAVENOUS

## 2016-01-17 MED ORDER — SODIUM CHLORIDE 0.9 % IV SOLN
420.0000 mg | Freq: Once | INTRAVENOUS | Status: AC
  Administered 2016-01-17: 420 mg via INTRAVENOUS
  Filled 2016-01-17: qty 14

## 2016-01-17 MED ORDER — SODIUM CHLORIDE 0.9 % IV SOLN
4.0000 mg | Freq: Once | INTRAVENOUS | Status: AC
  Administered 2016-01-17: 4 mg via INTRAVENOUS
  Filled 2016-01-17: qty 0.4

## 2016-01-17 MED ORDER — FAMOTIDINE IN NACL 20-0.9 MG/50ML-% IV SOLN
INTRAVENOUS | Status: AC
  Filled 2016-01-17: qty 50

## 2016-01-17 MED ORDER — TRASTUZUMAB CHEMO 150 MG IV SOLR
6.0000 mg/kg | Freq: Once | INTRAVENOUS | Status: AC
  Administered 2016-01-17: 420 mg via INTRAVENOUS
  Filled 2016-01-17: qty 20

## 2016-01-17 MED ORDER — SODIUM CHLORIDE 0.9% FLUSH
10.0000 mL | INTRAVENOUS | Status: DC | PRN
  Administered 2016-01-17: 10 mL
  Filled 2016-01-17: qty 10

## 2016-01-17 MED ORDER — SODIUM CHLORIDE 0.9 % IV SOLN
Freq: Once | INTRAVENOUS | Status: AC
  Administered 2016-01-17: 10:00:00 via INTRAVENOUS

## 2016-01-17 MED ORDER — SODIUM CHLORIDE 0.9 % IV SOLN
80.0000 mg/m2 | Freq: Once | INTRAVENOUS | Status: AC
  Administered 2016-01-17: 144 mg via INTRAVENOUS
  Filled 2016-01-17: qty 24

## 2016-01-17 MED ORDER — ACETAMINOPHEN 325 MG PO TABS
ORAL_TABLET | ORAL | Status: AC
Start: 2016-01-17 — End: 2016-01-17
  Filled 2016-01-17: qty 2

## 2016-01-17 NOTE — Progress Notes (Signed)
[place]  Telephone:([phone]) [phone] Fax:(336) [phone]     ID: YOUNIQUE CASAD DOB: 04/26/1956  MR#: [id number]  TTS#:[id number]  Patient Care Team: Darrol Jump, PA-C as PCP - General (Family Medicine) Fanny Skates, MD as Consulting Physician (General Surgery) Chauncey Cruel, MD as Consulting Physician (Oncology) Kyung Rudd, MD as Consulting Physician (Radiation Oncology) Larey Dresser, MD as Consulting Physician (Cardiology) Irene Limbo, MD as Consulting Physician (Plastic Surgery) Megan Salon, MD as Consulting Physician (Gynecology) OTHER MD:  CHIEF COMPLAINT: Triple positive breast cancer  CURRENT TREATMENT: Paclitaxel, trastuzumab, and pertuzumab   BREAST CANCER HISTORY: From the original intake note:  "Alexandra Mathis" had screening mammography in June 2017 showing upper outer quadrant calcifications. Accordingly she was referred for left diagnostic mammography and ultrasonography, performed 11/27/2015. The breast density was category C. Compression views of the left breast found pleomorphic calcifications in the upper outer quadrant measuring up to 4.0 cm. There was an area of associated architectural distortion in the upper outer quadrant as well. On physical exam there was an irregular thickening in the left breast at the 2:00 position anteriorly. Ultrasonography confirmed a hypoechoic irregular mass measuring 1.6 cm in the 2:00 position 2 cm from the nipple. An intramammary lymph node was noted with benign appearing. In the left axilla there was a lymph node with focally thickened cortex.  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 Biopsy of the left  axillary lymph node was negative.  Her subsequent history is as detailed below  INTERVAL HISTORY: Alexandra Mathis returns today for follow-up of her estrogen receptor positive breast cancer accompanied by her husband Alexandra Mathis. Today is day 1 cycle 4 of Taxol which she will receive weekly 12. Today is also day 1 cycle 2 of trastuzumab and pertuzumab which she will receive every 21 days during the Taxol treatments.  REVIEW OF SYSTEMS: Alexandra Mathis is doing remarkably well with her treatments. So far she has kept her here. She doesn't have nausea or vomiting problems. 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 She had some episodes where she felt short of breath and wasn't able to quite get up and down stairs without stopping. She has had minimal to no cough and no pleurisy no fever and no purulent sputum or hemoptysis. She is gained about 10 pounds. She is very concerned about this. She has had mild epistaxis. Otrivin is helping. She brought me a very thorough list of side effects, which we are scanning separately. This shows her to be afebrile with stable vitals, occasionally feeling shaky (Ativan helps), with other ancillary symptoms as noted there.  PAST MEDICAL HISTORY: Past Medical History:  Diagnosis Date  . Breast cancer ([place])   . Breast cancer of upper-outer quadrant of left female breast ([place]) 12/05/2015  . Chest pain   . Family history of premature CAD   . HLD (hyperlipidemia)     PAST SURGICAL HISTORY: Past Surgical History:  Procedure Laterality Date  . ANTERIOR CRUCIATE LIGAMENT REPAIR     right knee  . CESAREAN SECTION     x4  . GANGLION CYST EXCISION    . LAPAROSCOPIC ENDOMETRIOSIS FULGURATION    . PARTIAL HYSTERECTOMY    .  PORTACATH PLACEMENT Right 12/25/2015   Procedure: INSERTION PORT-A-CATH WITH [place];  Surgeon: Fanny Skates, MD;  Location: [facility];  Service: General;  Laterality: Right;  . TONSILLECTOMY    . WISDOM TOOTH EXTRACTION      FAMILY  HISTORY Family History  Problem Relation Age of Onset  . Other Mother     benign meningioma dx 89 s/p surgery; hx of hysterectomy for hemorrhaging  . Hypothyroidism Mother   . CAD Mother   . Colon cancer Father 76    w/ mets to lungs and liver; + chemo; d. 30  . Gout Father   . Arthritis Father   . Ovarian cancer Sister     early 28s; s/p USO  . Breast cancer Sister 61    s/p mastectomy and aromatase inhibitor  . Other Paternal Aunt 4    Primary peritoneal cancer; s/p surgery  . Cancer Paternal Uncle   . Ovarian cancer Sister 79    "pre-cancer"; s/p BSO  . Cervical cancer Sister   . Basal cell carcinoma Sister   . Colon polyps Sister     unspecified number  . Breast cancer Cousin 38    s/p mastectomy  . Ovarian cancer Cousin 39    s/p BSO  . Leukemia Cousin 74  . Cancer Cousin     dx carcinoid bowel tumor  . Other Cousin     reportedly negative "BRCA1/2 testing"  . Breast cancer Cousin 24    reportedly negative GT is 2014-2015  . CAD Maternal Grandmother     d. 90s  . Heart attack Maternal Grandfather 51  . Heart Problems Paternal Grandmother   . Heart Problems Paternal Grandfather   . CAD Brother     triple vessel CAD, d. 83  . Other Son     enlarged prostate w/ surveillance  . Thyroid cancer Maternal Uncle 71    unspecified type  . CAD Maternal Uncle     d. 21  . Other Cousin 48    dx benign "skin polyp"  . Breast cancer Other     paternal great aunt (PGM's sister)  . Breast cancer Other     paternal great aunt (PGM's sister)    GYNECOLOGIC HISTORY:  No LMP recorded. Patient is postmenopausal. Menarche age 78, first live birth age 44. The patient is GX P4. She stopped having periods in 1989. She did not use hormone replacement. She used oral contraceptives less than one year.  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 Her husband Alexandra Mathis is retired from a [employer]. Son [name] lives in [place] and works as a  Chief Strategy Officer. Son [name] lives in [place] and is with the [employer]. Son [name] this and cocoa [place] where he works as an Chief Financial Officer. Son [name] lives in [place] were he works as an Chief Financial Officer. The patient has 4 grandchildren including a 66-year-old they have custody of and lives with them. The patient attends our [place]: [place] place   HEALTH MAINTENANCE: Social History  Substance Use Topics  . Smoking status: Former Smoker    Years: 0.50    Types: Cigarettes    Quit date: 05/25/1974  . Smokeless tobacco: Never Used     Comment: was only a social/weekend smoker; smoked for 6 mos  . Alcohol use 0.5 oz/week    1 Standard drinks or equivalent per week     Comment: maybe 2-3  drinks per month     Colonoscopy:  PAP:  Bone density:   Allergies  Allergen Reactions  . Morphine And Related   . Vicodin [Hydrocodone-Acetaminophen]     Current Outpatient Prescriptions  Medication Sig Dispense Refill  . acetaminophen (TYLENOL) 325 MG tablet Take 2 tablets (650 mg total) by mouth every 4 (four) hours as needed for mild pain (or Fever >/= 101).    Marland Kitchen acetaminophen-codeine (TYLENOL #3) 300-30 MG tablet Take 1-2 tablets by mouth every 4 (four) hours as needed for moderate pain. 40 tablet 0  . aspirin 81 MG tablet Take 81 mg by mouth daily.    Marland Kitchen atorvastatin (LIPITOR) 40 MG tablet Take 40 mg by mouth daily.  5  . Cetirizine HCl 10 MG CAPS Take 10 mg by mouth.    . Cetirizine HCl 10 MG CAPS Take 1 capsule (10 mg total) by mouth 1 day or 1 dose. 30 capsule   . Cholecalciferol 1000 units capsule Take 1,000 Units by mouth daily.    Marland Kitchen etodolac (LODINE) 400 MG tablet Take 400 mg by mouth 2 (two) times daily as needed for mild pain.   2  . FIBER COMPLETE PO Take by mouth daily.      Marland Kitchen lidocaine-prilocaine (EMLA) cream Apply to affected area once 30 g 3  . LORazepam (ATIVAN) 0.5 MG tablet Take 1 tablet (0.5 mg total) by mouth every 8 (eight) hours  as needed for anxiety. 30 tablet 0  . methylPREDNISolone (MEDROL DOSEPAK) 4 MG TBPK tablet Take as directed 21 tablet 0  . Multiple Vitamin (MULTIVITAMIN) tablet Take 1 tablet by mouth daily.      Marland Kitchen omeprazole (PRILOSEC) 20 MG capsule Take 20 mg by mouth daily.    . ondansetron (ZOFRAN) 8 MG tablet Take 1 tablet (8 mg total) by mouth 2 (two) times daily as needed (Nausea or vomiting). 30 tablet 1  . Probiotic Product (ALIGN PO) Take by mouth.    . prochlorperazine (COMPAZINE) 10 MG tablet Take 1 tablet (10 mg total) by mouth every 6 (six) hours as needed (Nausea or vomiting). 30 tablet 1  . Vitamins C E (CRANBERRY CONCENTRATE PO) Take by mouth daily.       No current facility-administered medications for this visit.    Facility-Administered Medications Ordered in Other Visits  Medication Dose Route Frequency Provider Last Rate Last Dose  . sodium chloride flush (NS) 0.9 % injection 10 mL  10 mL Intracatheter PRN Chauncey Cruel, MD   10 mL at 12/27/15 1630    OBJECTIVE: Middle-aged white woman Who appears well Vitals:   01/17/16 0837  BP: 125/68  Pulse: 81  Resp: 18  Temp: 98.3 F (36.8 C)     Body mass index is 24.44 kg/m.    ECOG FS:1 - Symptomatic but completely ambulatory  Sclerae unicteric, pupils round and equal Oropharynx clear and moist-- no thrush or other lesions No cervical or supraclavicular adenopathy Lungs no rales or rhonchi, no wheezes, good excursion bilaterally Heart regular rate and rhythm Abd soft, nontender, positive bowel sounds MSK no focal spinal tenderness, no upper extremity lymphedema Neuro: nonfocal, well oriented, appropriate affect Breasts: Deferred ' LAB RESULTS:  CMP     Component Value Date/Time   NA 140 01/10/2016 0757   K 4.1 01/10/2016 0757   CO2 29 01/10/2016 0757   GLUCOSE 85 01/10/2016 0757   BUN 12.4 01/10/2016 0757   CREATININE 0.7 01/10/2016 0757   CALCIUM 9.4 01/10/2016 0757  PROT 7.1 01/10/2016 0757   ALBUMIN 3.7  01/10/2016 0757   AST 18 01/10/2016 0757   ALT 20 01/10/2016 0757   ALKPHOS 66 01/10/2016 0757   BILITOT 0.32 01/10/2016 0757    INo results found for: SPEP, UPEP  Lab Results  Component Value Date   WBC 3.8 (L) 01/17/2016   NEUTROABS 2.4 01/17/2016   HGB 11.7 01/17/2016   HCT 34.8 01/17/2016   MCV 88.6 01/17/2016   PLT 208 01/17/2016      Chemistry      Component Value Date/Time   NA 140 01/10/2016 0757   K 4.1 01/10/2016 0757   CO2 29 01/10/2016 0757   BUN 12.4 01/10/2016 0757   CREATININE 0.7 01/10/2016 0757      Component Value Date/Time   CALCIUM 9.4 01/10/2016 0757   ALKPHOS 66 01/10/2016 0757   AST 18 01/10/2016 0757   ALT 20 01/10/2016 0757   BILITOT 0.32 01/10/2016 0757       No results found for: LABCA2  No components found for: LABCA125  No results for input(s): INR in the last 168 hours.  Urinalysis No results found for: COLORURINE, APPEARANCEUR, LABSPEC, PHURINE, GLUCOSEU, HGBUR, BILIRUBINUR, KETONESUR, PROTEINUR, UROBILINOGEN, NITRITE, LEUKOCYTESUR   STUDIES: Dg Chest Port 1 View  Result Date: 12/25/2015 CLINICAL DATA:  Post Port-A-Cath placement, breast cancer EXAM: PORTABLE CHEST 1 VIEW COMPARISON:  Portable exam 1155 hours without priors for comparison FINDINGS: RIGHT subclavian Port-A-Cath with tip projecting over SVC. Normal heart size, mediastinal contours, and pulmonary vascularity. Lungs clear. No pleural effusion or pneumothorax. Goal bones unremarkable. IMPRESSION: No pneumothorax following RIGHT subclavian power port insertion. Electronically Signed   By: Lavonia Dana M.D.   On: 12/25/2015 12:16   Mm Digital Diagnostic Unilat R  Result Date: 12/26/2015 CLINICAL DATA:  Post biopsy mammogram of the right breast for clip placement. EXAM: DIAGNOSTIC RIGHT MAMMOGRAM POST MRI BIOPSY COMPARISON:  Previous exam(s). FINDINGS: Mammographic images were obtained following MRI guided biopsy of a right breast mass in the lower outer quadrant. The  dumbbell-shaped biopsy marking clip is appropriately positioned at the site of biopsy of the lower outer right breast. IMPRESSION: Appropriate positioning of the dumbbell-shaped biopsy marking clip in the lower outer right breast. Final Assessment: Post Procedure Mammograms for Marker Placement Electronically Signed   By: Ammie Ferrier M.D.   On: 12/26/2015 09:30   Dg C-arm 1-60 Min-no Report  Result Date: 12/25/2015 CLINICAL DATA: surgery C-ARM 1-60 MINUTES Fluoroscopy was utilized by the requesting physician.  No radiographic interpretation.   Mr Rt Breast Bx Johnella Moloney Dev 1st Lesion Image Bx Spec Mr Guide  Addendum Date: 12/30/2015   ADDENDUM REPORT: 12/27/2015 15:38 ADDENDUM: Pathology revealed FIBROCYSTIC CHANGES of the lower outer quadrant of the Right breast. This was found to be concordant by Dr. Ammie Ferrier. Pathology results were discussed with the patient by telephone. The patient reported doing well after the biopsy with tenderness at the site. Post biopsy instructions and care were reviewed and questions were answered. The patient was encouraged to call [facility] for any additional concerns. The patient has a recent diagnosis of Left breast cancer and should follow her outlined treatment plan. A 6 month follow up MRI is recommended if the patient does not proceed with planned bilateral mastectomy (for known left breast cancer). Pathology results reported by Terie Purser, RN on 12/27/2015. Electronically Signed   By: Ammie Ferrier M.D.   On: 12/27/2015 15:38   Result Date:  12/30/2015 CLINICAL DATA:  60 year old female presenting for biopsy of a right breast mass. 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 EXAM: MRI GUIDED CORE NEEDLE BIOPSY OF THE RIGHT BREAST TECHNIQUE: Multiplanar, multisequence MR imaging of the right breast was performed both before and after administration of intravenous contrast. CONTRAST:  14 mg  of MultiHance. COMPARISON:  Prior exams. FINDINGS: I met with the patient, and we discussed the procedure of MRI guided biopsy, including risks, benefits, and alternatives. Specifically, we discussed the risks of infection, bleeding, tissue injury, clip migration, and inadequate sampling. Informed, written consent was given. The usual time out protocol was performed immediately prior to the procedure. Using sterile technique, 1% Lidocaine, MRI guidance, and a 9 gauge vacuum assisted device, biopsy was performed of a small mass in the lower outer quadrant of the right breast using a lateral approach. At the conclusion of the procedure, a dumbbell shaped tissue marker clip was deployed into the biopsy cavity. Follow-up 2-view mammogram was performed and dictated separately. IMPRESSION: MRI guided biopsy of a small mass in the lower outer quadrant of the right breast. No apparent complications. Electronically Signed: By: Ammie Ferrier M.D. On: 12/26/2015 09:31     ELIGIBLE FOR AVAILABLE RESEARCH PROTOCOL: no  ASSESSMENT: 60 y.o. [place], [place] woman status post left breast upper outer quadrant biopsy 12/02/2015 for a clinical T1c pN0, stage IA  invasive ductal carcinoma, grade 3, estrogen and progesterone receptor positive, with an MIB-1 of 15%, and HER-2 amplification  (a) biopsy of a 0.6 cm lower outer quadrant right breast mass scheduled for 12/26/2015  (1) neoadjuvant chemotherapy will consist of weekly paclitaxel 12 with trastuzumab and pertuzumab every 21 days 4 starting 12/27/2015  (2) trastuzumab to be continued to total of one year  (a) echo 12/16/2015 showed an ejection fraction in the 55-60% range.  (3) definitive surgery to follow chemotherapy: considering bilateral mastectomies  (4) adjuvant radiation to follow as appropriate  (5) anti-estrogens to follow at the completion of local treatment  (a) patient is considering bilateral salpingo-oophorectomy  (6) genetics testing  01/09/2016-- results pending   PLAN: Alexandra Mathis is doing well with her treatment so far and she is ready to start her second cycle, continuing Taxol weekly and trastuzumab/pertuzumab every 3 weeks.  I do not have a simple explanation for her shortness of breath. There is no anemia. Her exam today is benign. I do not see an indication for chest x-ray, but I offered her an inhaler. At this point she prefers to simply observe.  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 This is a great relief to her.  Otherwise I will see her again next week. I am delighted at how well she is doing with her treatments. She knows to call for any problems that may develop before the next visit.  :Chauncey Cruel, MD   01/17/2016 8:42 AM Medical Oncology and Hematology [facility] [place], [place] [address] Tel. [phone]    Fax. [phone]

## 2016-01-17 NOTE — Patient Instructions (Signed)
Cancer Center Discharge Instructions for Patients Receiving Chemotherapy  Today you received the following chemotherapy agents herceptin/perjeta  To help prevent nausea and vomiting after your treatment, we encourage you to take your nausea medication as directed   If you develop nausea and vomiting that is not controlled by your nausea medication, call the clinic.   BELOW ARE SYMPTOMS THAT SHOULD BE REPORTED IMMEDIATELY:  *FEVER GREATER THAN 100.5 F  *CHILLS WITH OR WITHOUT FEVER  NAUSEA AND VOMITING THAT IS NOT CONTROLLED WITH YOUR NAUSEA MEDICATION  *UNUSUAL SHORTNESS OF BREATH  *UNUSUAL BRUISING OR BLEEDING  TENDERNESS IN MOUTH AND THROAT WITH OR WITHOUT PRESENCE OF ULCERS  *URINARY PROBLEMS  *BOWEL PROBLEMS  UNUSUAL RASH Items with * indicate a potential emergency and should be followed up as soon as possible.  Feel free to call the clinic you have any questions or concerns. The clinic phone number is [phone].  

## 2016-01-24 ENCOUNTER — Other Ambulatory Visit (HOSPITAL_BASED_OUTPATIENT_CLINIC_OR_DEPARTMENT_OTHER): Payer: BC Managed Care – PPO

## 2016-01-24 ENCOUNTER — Ambulatory Visit (HOSPITAL_BASED_OUTPATIENT_CLINIC_OR_DEPARTMENT_OTHER): Payer: BC Managed Care – PPO

## 2016-01-24 ENCOUNTER — Ambulatory Visit: Payer: BC Managed Care – PPO

## 2016-01-24 ENCOUNTER — Ambulatory Visit (HOSPITAL_BASED_OUTPATIENT_CLINIC_OR_DEPARTMENT_OTHER): Payer: BC Managed Care – PPO | Admitting: Oncology

## 2016-01-24 VITALS — BP 136/78 | HR 72 | Temp 98.4°F | Resp 18 | Ht 67.5 in | Wt 159.0 lb

## 2016-01-24 DIAGNOSIS — C50412 Malignant neoplasm of upper-outer quadrant of left female breast: Secondary | ICD-10-CM

## 2016-01-24 DIAGNOSIS — C773 Secondary and unspecified malignant neoplasm of axilla and upper limb lymph nodes: Secondary | ICD-10-CM

## 2016-01-24 DIAGNOSIS — Z5111 Encounter for antineoplastic chemotherapy: Secondary | ICD-10-CM | POA: Diagnosis not present

## 2016-01-24 DIAGNOSIS — R5383 Other fatigue: Secondary | ICD-10-CM

## 2016-01-24 DIAGNOSIS — Z95828 Presence of other vascular implants and grafts: Secondary | ICD-10-CM

## 2016-01-24 LAB — CBC WITH DIFFERENTIAL/PLATELET
BASO%: 0.5 % (ref 0.0–2.0)
BASOS ABS: 0 10*3/uL (ref 0.0–0.1)
EOS ABS: 0.1 10*3/uL (ref 0.0–0.5)
EOS%: 1.7 % (ref 0.0–7.0)
HCT: 32.1 % — ABNORMAL LOW (ref 34.8–46.6)
HEMOGLOBIN: 11.3 g/dL — AB (ref 11.6–15.9)
LYMPH%: 36 % (ref 14.0–49.7)
MCH: 30.8 pg (ref 25.1–34.0)
MCHC: 35.2 g/dL (ref 31.5–36.0)
MCV: 87.5 fL (ref 79.5–101.0)
MONO#: 0.3 10*3/uL (ref 0.1–0.9)
MONO%: 8.4 % (ref 0.0–14.0)
NEUT#: 2.2 10*3/uL (ref 1.5–6.5)
NEUT%: 53.4 % (ref 38.4–76.8)
PLATELETS: 211 10*3/uL (ref 145–400)
RBC: 3.67 10*6/uL — AB (ref 3.70–5.45)
RDW: 14 % (ref 11.2–14.5)
WBC: 4.1 10*3/uL (ref 3.9–10.3)
lymph#: 1.5 10*3/uL (ref 0.9–3.3)
nRBC: 0 % (ref 0–0)

## 2016-01-24 LAB — COMPREHENSIVE METABOLIC PANEL
ALBUMIN: 3.4 g/dL — AB (ref 3.5–5.0)
ALK PHOS: 72 U/L (ref 40–150)
ALT: 16 U/L (ref 0–55)
ANION GAP: 9 meq/L (ref 3–11)
AST: 18 U/L (ref 5–34)
BILIRUBIN TOTAL: 0.34 mg/dL (ref 0.20–1.20)
BUN: 17.8 mg/dL (ref 7.0–26.0)
CO2: 25 mEq/L (ref 22–29)
Calcium: 9.2 mg/dL (ref 8.4–10.4)
Chloride: 105 mEq/L (ref 98–109)
Creatinine: 0.7 mg/dL (ref 0.6–1.1)
GLUCOSE: 98 mg/dL (ref 70–140)
POTASSIUM: 4.2 meq/L (ref 3.5–5.1)
SODIUM: 140 meq/L (ref 136–145)
Total Protein: 6.9 g/dL (ref 6.4–8.3)

## 2016-01-24 MED ORDER — SODIUM CHLORIDE 0.9% FLUSH
10.0000 mL | INTRAVENOUS | Status: DC | PRN
Start: 2016-01-24 — End: 2016-01-24
  Administered 2016-01-24: 10 mL
  Filled 2016-01-24: qty 10

## 2016-01-24 MED ORDER — SODIUM CHLORIDE 0.9 % IV SOLN
Freq: Once | INTRAVENOUS | Status: AC
  Administered 2016-01-24: 10:00:00 via INTRAVENOUS

## 2016-01-24 MED ORDER — LORAZEPAM 2 MG/ML IJ SOLN
0.5000 mg | Freq: Once | INTRAMUSCULAR | Status: AC
  Administered 2016-01-24: 0.5 mg via INTRAVENOUS

## 2016-01-24 MED ORDER — LORAZEPAM 2 MG/ML IJ SOLN
INTRAMUSCULAR | Status: AC
  Filled 2016-01-24: qty 1

## 2016-01-24 MED ORDER — FAMOTIDINE IN NACL 20-0.9 MG/50ML-% IV SOLN
INTRAVENOUS | Status: AC
  Filled 2016-01-24: qty 50

## 2016-01-24 MED ORDER — SODIUM CHLORIDE 0.9 % IV SOLN
80.0000 mg/m2 | Freq: Once | INTRAVENOUS | Status: AC
  Administered 2016-01-24: 144 mg via INTRAVENOUS
  Filled 2016-01-24: qty 24

## 2016-01-24 MED ORDER — FAMOTIDINE IN NACL 20-0.9 MG/50ML-% IV SOLN
20.0000 mg | Freq: Once | INTRAVENOUS | Status: AC
  Administered 2016-01-24: 20 mg via INTRAVENOUS

## 2016-01-24 MED ORDER — SODIUM CHLORIDE 0.9 % IJ SOLN
10.0000 mL | INTRAMUSCULAR | Status: DC | PRN
  Administered 2016-01-24: 10 mL via INTRAVENOUS
  Filled 2016-01-24: qty 10

## 2016-01-24 MED ORDER — SODIUM CHLORIDE 0.9 % IV SOLN
4.0000 mg | Freq: Once | INTRAVENOUS | Status: AC
  Administered 2016-01-24: 4 mg via INTRAVENOUS
  Filled 2016-01-24: qty 0.4

## 2016-01-24 MED ORDER — HEPARIN SOD (PORK) LOCK FLUSH 100 UNIT/ML IV SOLN
500.0000 [IU] | Freq: Once | INTRAVENOUS | Status: AC | PRN
  Administered 2016-01-24: 500 [IU]
  Filled 2016-01-24: qty 5

## 2016-01-24 NOTE — Patient Instructions (Signed)

## 2016-01-24 NOTE — Patient Instructions (Signed)
[facility] Discharge Instructions for Patients Receiving Chemotherapy  Today you received the following chemotherapy agents:  Taxol (paclitaxel)  To help prevent nausea and vomiting after your treatment, we encourage you to take your nausea medication as prescribed.   If you develop nausea and vomiting that is not controlled by your nausea medication, call the clinic.   BELOW ARE SYMPTOMS THAT SHOULD BE REPORTED IMMEDIATELY:  *FEVER GREATER THAN 100.5 F  *CHILLS WITH OR WITHOUT FEVER  NAUSEA AND VOMITING THAT IS NOT CONTROLLED WITH YOUR NAUSEA MEDICATION  *UNUSUAL SHORTNESS OF BREATH  *UNUSUAL BRUISING OR BLEEDING  TENDERNESS IN MOUTH AND THROAT WITH OR WITHOUT PRESENCE OF ULCERS  *URINARY PROBLEMS  *BOWEL PROBLEMS  UNUSUAL RASH Items with * indicate a potential emergency and should be followed up as soon as possible.  Feel free to call the clinic you have any questions or concerns. The clinic phone number is [phone].  Please show the CHEMO ALERT CARD at check-in to the Emergency Department and triage nurse.   

## 2016-01-24 NOTE — Progress Notes (Signed)
[facility]  [phone] [phone]     ID: [id number] MCGLINN DOB: 04-21-1956  MR#: [id number]  WCB#:[id number]  Patient Care Team: Darrol Jump, PA-C as PCP - General (Family Medicine) Fanny Skates, MD as Consulting Physician (General Surgery) Chauncey Cruel, MD as Consulting Physician (Oncology) Kyung Rudd, MD as Consulting Physician (Radiation Oncology) Larey Dresser, MD as Consulting Physician (Cardiology) Irene Limbo, MD as Consulting Physician (Plastic Surgery) Megan Salon, MD as Consulting Physician (Gynecology) OTHER MD:  CHIEF COMPLAINT: Triple positive breast cancer  CURRENT TREATMENT: Paclitaxel, trastuzumab, and pertuzumab   BREAST CANCER HISTORY: From the original intake note:  "Margie" had screening mammography in June 2017 showing upper outer quadrant calcifications. Accordingly she was referred for left diagnostic mammography and ultrasonography, performed 11/27/2015. The breast density was category C. Compression views of the left breast found pleomorphic calcifications in the upper outer quadrant measuring up to 4.0 cm. There was an area of associated architectural distortion in the upper outer quadrant as well. On physical exam there was an irregular thickening in the left breast at the 2:00 position anteriorly. Ultrasonography confirmed a hypoechoic irregular mass measuring 1.6 cm in the 2:00 position 2 cm from the nipple. An intramammary lymph node was noted with benign appearing. In the left axilla there was a lymph node with focally thickened cortex.  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 Biopsy of the left  axillary lymph node was negative.  Her subsequent history is as detailed below  INTERVAL HISTORY: Lesleigh Noe returns today for follow-up of her triple positive breast cancer. Her husband [name] is babysitting their 64-year-old granddaughter that they have custody of. Today is day 1 cycle 5 of Taxol which Lesleigh Noe will receive weekly 12. Today is also day 8  cycle 2 of trastuzumab and pertuzumab which she will receive every 21 days during the Taxol treatments.  REVIEW OF SYSTEMS: Lesleigh Noe continues to tolerate her treatment remarkably well. She has not developed severe or even significant diarrhea from the pertuzumab. She does feel fatigued and she is making some adjustments at work (for example using the elevator instead of taking stairs). She is taking naps usually days 1 through 4. She is having mild epistaxis problems, well controlled on the ultrasound. She has started to lose her hair although that is not yet noticeable. She has a little bit of scalp discomfort. Aside from these issues a detailed review of systems today was noncontributory  PAST MEDICAL HISTORY: Past Medical History:  Diagnosis Date  . Breast cancer ([facility])   . Breast cancer of upper-outer quadrant of left female breast ([facility]) 12/05/2015  . Chest pain   . Family history of premature CAD   . HLD (hyperlipidemia)     PAST SURGICAL HISTORY: Past Surgical History:  Procedure Laterality Date  . ANTERIOR CRUCIATE LIGAMENT REPAIR     right knee  . CESAREAN SECTION     x4  . GANGLION CYST EXCISION    . LAPAROSCOPIC ENDOMETRIOSIS FULGURATION    . PARTIAL HYSTERECTOMY    . PORTACATH PLACEMENT Right 12/25/2015   Procedure: INSERTION PORT-A-CATH WITH [place];  Surgeon: Fanny Skates, MD;  Location: [facility];  Service: General;  Laterality: Right;  .  TONSILLECTOMY    . WISDOM TOOTH EXTRACTION      FAMILY HISTORY Family History  Problem Relation Age of Onset  . Other Mother     benign meningioma dx 30 s/p surgery; hx of hysterectomy for  hemorrhaging  . Hypothyroidism Mother   . CAD Mother   . Colon cancer Father 54    w/ mets to lungs and liver; + chemo; d. 70  . Gout Father   . Arthritis Father   . Ovarian cancer Sister     early 26s; s/p USO  . Breast cancer Sister 65    s/p mastectomy and aromatase inhibitor  . Other Paternal Aunt 8    Primary peritoneal cancer; s/p surgery  . Cancer Paternal Uncle   . Ovarian cancer Sister 58    "pre-cancer"; s/p BSO  . Cervical cancer Sister   . Basal cell carcinoma Sister   . Colon polyps Sister     unspecified number  . Breast cancer Cousin 78    s/p mastectomy  . Ovarian cancer Cousin 86    s/p BSO  . Leukemia Cousin 67  . Cancer Cousin     dx carcinoid bowel tumor  . Other Cousin     reportedly negative "BRCA1/2 testing"  . Breast cancer Cousin 57    reportedly negative GT is 2014-2015  . CAD Maternal Grandmother     d. 90s  . Heart attack Maternal Grandfather 51  . Heart Problems Paternal Grandmother   . Heart Problems Paternal Grandfather   . CAD Brother     triple vessel CAD, d. 32  . Other Son     enlarged prostate w/ surveillance  . Thyroid cancer Maternal Uncle 71    unspecified type  . CAD Maternal Uncle     d. 74  . Other Cousin 48    dx benign "skin polyp"  . Breast cancer Other     paternal great aunt (PGM's sister)  . Breast cancer Other     paternal great aunt (PGM's sister)    GYNECOLOGIC HISTORY:  No LMP recorded. Patient is postmenopausal. Menarche age 60, first live birth age 34. The patient is GX P4. She stopped having periods in 1989. She did not use hormone replacement. She used oral contraceptives less than one year.  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 Her husband [name] is retired from a [employer]. Son [name] lives in [place] and works as a Chief Strategy Officer. Son [name] lives in [place] and is with the [employer]. Son [name] this and cocoa [place] where he works as an  Chief Financial Officer. Son [name] lives in [place] were he works as an Chief Financial Officer. The patient has 4 grandchildren including a 15-year-old they have custody of and lives with them. The patient attends our [employer]: [employer]   HEALTH MAINTENANCE: Social History  Substance Use Topics  . Smoking status: Former Smoker    Years: 0.50    Types: Cigarettes    Quit date: 05/25/1974  . Smokeless tobacco: Never Used     Comment: was only a social/weekend smoker; smoked for 6 mos  . Alcohol use 0.5 oz/week    1 Standard drinks or equivalent per week     Comment: maybe 2-3 drinks per month     Colonoscopy:  PAP:  Bone density:   Allergies  Allergen Reactions  . Morphine And Related   . Vicodin [  Hydrocodone-Acetaminophen]     Current Outpatient Prescriptions  Medication Sig Dispense Refill  . acetaminophen (TYLENOL) 325 MG tablet Take 2 tablets (650 mg total) by mouth every 4 (four) hours as needed for mild pain (or Fever >/= 101).    Marland Kitchen acetaminophen-codeine (TYLENOL #3) 300-30 MG tablet Take 1-2 tablets by mouth every 4 (four) hours as needed for moderate pain. 40 tablet 0  . aspirin 81 MG tablet Take 81 mg by mouth daily.    Marland Kitchen atorvastatin (LIPITOR) 40 MG tablet Take 40 mg by mouth daily.  5  . Cetirizine HCl 10 MG CAPS Take 10 mg by mouth.    . Cetirizine HCl 10 MG CAPS Take 1 capsule (10 mg total) by mouth 1 day or 1 dose. 30 capsule   . Cholecalciferol 1000 units capsule Take 1,000 Units by mouth daily.    Marland Kitchen etodolac (LODINE) 400 MG tablet Take 400 mg by mouth 2 (two) times daily as needed for mild pain.   2  . FIBER COMPLETE PO Take by mouth daily.      Marland Kitchen lidocaine-prilocaine (EMLA) cream Apply to affected area once 30 g 3  . LORazepam (ATIVAN) 0.5 MG tablet Take 1 tablet (0.5 mg total) by mouth every 8 (eight) hours as needed for anxiety. 30 tablet 0  . methylPREDNISolone (MEDROL DOSEPAK) 4 MG TBPK tablet Take as directed 21 tablet 0  . Multiple  Vitamin (MULTIVITAMIN) tablet Take 1 tablet by mouth daily.      Marland Kitchen omeprazole (PRILOSEC) 20 MG capsule Take 20 mg by mouth daily.    . ondansetron (ZOFRAN) 8 MG tablet Take 1 tablet (8 mg total) by mouth 2 (two) times daily as needed (Nausea or vomiting). 30 tablet 1  . Probiotic Product (ALIGN PO) Take by mouth.    . prochlorperazine (COMPAZINE) 10 MG tablet Take 1 tablet (10 mg total) by mouth every 6 (six) hours as needed (Nausea or vomiting). 30 tablet 1  . Vitamins C E (CRANBERRY CONCENTRATE PO) Take by mouth daily.       No current facility-administered medications for this visit.    Facility-Administered Medications Ordered in Other Visits  Medication Dose Route Frequency Provider Last Rate Last Dose  . sodium chloride flush (NS) 0.9 % injection 10 mL  10 mL Intracatheter PRN Chauncey Cruel, MD   10 mL at 12/27/15 1630    OBJECTIVE: Middle-aged white woman In no acute distress Vitals:   01/24/16 0853  BP: 136/78  Pulse: 72  Resp: 18  Temp: 98.4 F (36.9 C)     Body mass index is 24.54 kg/m.    ECOG FS:1 - Symptomatic but completely ambulatory  Sclerae unicteric, EOMs intact Oropharynx clear and moist No cervical or supraclavicular adenopathy Lungs no rales or rhonchi Heart regular rate and rhythm Abd soft, nontender, positive bowel sounds MSK no focal spinal tenderness, no upper extremity lymphedema Neuro: nonfocal, well oriented, appropriate affect Breasts: Deferred  ' LAB RESULTS:  CMP     Component Value Date/Time   NA 142 01/17/2016 0813   K 4.3 01/17/2016 0813   CO2 26 01/17/2016 0813   GLUCOSE 96 01/17/2016 0813   BUN 14.5 01/17/2016 0813   CREATININE 0.7 01/17/2016 0813   CALCIUM 9.5 01/17/2016 0813   PROT 6.9 01/17/2016 0813   ALBUMIN 3.4 (L) 01/17/2016 0813   AST 21 01/17/2016 0813   ALT 25 01/17/2016 0813   ALKPHOS 69 01/17/2016 0813   BILITOT 0.31 01/17/2016 0813  INo results found for: SPEP, UPEP  Lab Results  Component Value Date    WBC 4.1 01/24/2016   NEUTROABS 2.2 01/24/2016   HGB 11.3 (L) 01/24/2016   HCT 32.1 (L) 01/24/2016   MCV 87.5 01/24/2016   PLT 211 01/24/2016      Chemistry      Component Value Date/Time   NA 142 01/17/2016 0813   K 4.3 01/17/2016 0813   CO2 26 01/17/2016 0813   BUN 14.5 01/17/2016 0813   CREATININE 0.7 01/17/2016 0813      Component Value Date/Time   CALCIUM 9.5 01/17/2016 0813   ALKPHOS 69 01/17/2016 0813   AST 21 01/17/2016 0813   ALT 25 01/17/2016 0813   BILITOT 0.31 01/17/2016 0813       No results found for: LABCA2  No components found for: LABCA125  No results for input(s): INR in the last 168 hours.  Urinalysis No results found for: COLORURINE, APPEARANCEUR, LABSPEC, PHURINE, GLUCOSEU, HGBUR, BILIRUBINUR, KETONESUR, PROTEINUR, UROBILINOGEN, NITRITE, LEUKOCYTESUR   STUDIES: Dg Chest Port 1 View  Result Date: 12/25/2015 CLINICAL DATA:  Post Port-A-Cath placement, breast cancer EXAM: PORTABLE CHEST 1 VIEW COMPARISON:  Portable exam 1155 hours without priors for comparison FINDINGS: RIGHT subclavian Port-A-Cath with tip projecting over SVC. Normal heart size, mediastinal contours, and pulmonary vascularity. Lungs clear. No pleural effusion or pneumothorax. Goal bones unremarkable. IMPRESSION: No pneumothorax following RIGHT subclavian power port insertion. Electronically Signed   By: Lavonia Dana M.D.   On: 12/25/2015 12:16   Mm Digital Diagnostic Unilat R  Result Date: 12/26/2015 CLINICAL DATA:  Post biopsy mammogram of the right breast for clip placement. EXAM: DIAGNOSTIC RIGHT MAMMOGRAM POST MRI BIOPSY COMPARISON:  Previous exam(s). FINDINGS: Mammographic images were obtained following MRI guided biopsy of a right breast mass in the lower outer quadrant. The dumbbell-shaped biopsy marking clip is appropriately positioned at the site of biopsy of the lower outer right breast. IMPRESSION: Appropriate positioning of the dumbbell-shaped biopsy marking clip in the lower  outer right breast. Final Assessment: Post Procedure Mammograms for Marker Placement Electronically Signed   By: Ammie Ferrier M.D.   On: 12/26/2015 09:30   Dg C-arm 1-60 Min-no Report  Result Date: 12/25/2015 CLINICAL DATA: surgery C-ARM 1-60 MINUTES Fluoroscopy was utilized by the requesting physician.  No radiographic interpretation.   Mr Rt Breast Bx Johnella Moloney Dev 1st Lesion Image Bx Spec Mr Guide  Addendum Date: 12/30/2015   ADDENDUM REPORT: 12/27/2015 15:38 ADDENDUM: Pathology revealed FIBROCYSTIC CHANGES of the lower outer quadrant of the Right breast. This was found to be concordant by Dr. Ammie Ferrier. Pathology results were discussed with the patient by telephone. The patient reported doing well after the biopsy with tenderness at the site. Post biopsy instructions and care were reviewed and questions were answered. The patient was encouraged to call [facility] for any additional concerns. The patient has a recent diagnosis of Left breast cancer and should follow her outlined treatment plan. A 6 month follow up MRI is recommended if the patient does not proceed with planned bilateral mastectomy (for known left breast cancer). Pathology results reported by Terie Purser, RN on 12/27/2015. Electronically Signed   By: Ammie Ferrier M.D.   On: 12/27/2015 15:38   Result Date: 12/30/2015 CLINICAL DATA:  60 year old female presenting for biopsy of a right breast mass. 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 EXAM: MRI GUIDED CORE NEEDLE BIOPSY OF THE  RIGHT BREAST TECHNIQUE: Multiplanar, multisequence MR imaging of the right breast was performed both before and after administration of intravenous contrast. CONTRAST:  14 mg of MultiHance. COMPARISON:  Prior exams. FINDINGS: I met with the patient, and we discussed the procedure of MRI guided biopsy, including risks, benefits, and alternatives. Specifically, we discussed the risks  of infection, bleeding, tissue injury, clip migration, and inadequate sampling. Informed, written consent was given. The usual time out protocol was performed immediately prior to the procedure. Using sterile technique, 1% Lidocaine, MRI guidance, and a 9 gauge vacuum assisted device, biopsy was performed of a small mass in the lower outer quadrant of the right breast using a lateral approach. At the conclusion of the procedure, a dumbbell shaped tissue marker clip was deployed into the biopsy cavity. Follow-up 2-view mammogram was performed and dictated separately. IMPRESSION: MRI guided biopsy of a small mass in the lower outer quadrant of the right breast. No apparent complications. Electronically Signed: By: Ammie Ferrier M.D. On: 12/26/2015 09:31     ELIGIBLE FOR AVAILABLE RESEARCH PROTOCOL: no  ASSESSMENT: 60 y.o. [place], [place] woman status post left breast upper outer quadrant biopsy 12/02/2015 for a clinical T1c pN0, stage IA  invasive ductal carcinoma, grade 3, estrogen and progesterone receptor positive, with an MIB-1 of 15%, and HER-2 amplification  (a) biopsy of a 0.6 cm lower outer quadrant right breast mass scheduled for 12/26/2015  (1) neoadjuvant chemotherapy consisting of weekly paclitaxel 12 with trastuzumab and pertuzumab every 21 days 4 started 12/27/2015  (2) trastuzumab to be continued to total of one year  (a) echo 12/16/2015 showed an ejection fraction in the 55-60% range.  (3) definitive surgery to follow chemotherapy: considering bilateral mastectomies  (4) adjuvant radiation to follow as appropriate  (5) anti-estrogens to follow at the completion of local treatment  (a) patient is considering bilateral salpingo-oophorectomy  (6) genetics testing 01/09/2016-- results pending   PLAN: Lesleigh Noe continues to tolerate her treatments well and will proceed to her fifth dose of paclitaxel today.  She is beginning to have noticeable fatigue and this is putting her  in a very different situation than what she is used to: She is usually the one who does for other people. This also means that her husband is having to do more to take care of the 37-year-old. While these are not thinks that she wanted, there are some positive there including her husband bonding further with the 38-year-old and Margie's learning how the person who is helped feels. For someone who intends to continue to help others that is an important lessened to learn.  Otherwise we are proceeding with treatment as before. I am hoping she will not lose all her hair but that might yet happened. She will see me again in a week. She prefers not to see me on the weeks when she receives B antibodies because that makes a visit excessively long  She knows to call for any problems that may develop before her next visit here.   :Chauncey Cruel, MD   01/24/2016 9:10 AM Medical Oncology and Hematology [facility] [place], [place] [address] Tel. [phone]    Fax. [phone]

## 2016-01-28 ENCOUNTER — Telehealth: Payer: Self-pay | Admitting: Genetic Counselor

## 2016-01-28 ENCOUNTER — Telehealth (HOSPITAL_COMMUNITY): Payer: Self-pay | Admitting: Vascular Surgery

## 2016-01-28 NOTE — Telephone Encounter (Signed)
Left pt message to make NP brst appt w/ echo in Oct

## 2016-01-29 ENCOUNTER — Ambulatory Visit: Payer: Self-pay | Admitting: Genetic Counselor

## 2016-01-29 DIAGNOSIS — Z809 Family history of malignant neoplasm, unspecified: Secondary | ICD-10-CM

## 2016-01-29 DIAGNOSIS — Z8041 Family history of malignant neoplasm of ovary: Secondary | ICD-10-CM

## 2016-01-29 DIAGNOSIS — C50412 Malignant neoplasm of upper-outer quadrant of left female breast: Secondary | ICD-10-CM

## 2016-01-29 DIAGNOSIS — Z1379 Encounter for other screening for genetic and chromosomal anomalies: Secondary | ICD-10-CM

## 2016-01-29 DIAGNOSIS — Z803 Family history of malignant neoplasm of breast: Secondary | ICD-10-CM

## 2016-01-29 DIAGNOSIS — Z800 Family history of malignant neoplasm of digestive organs: Secondary | ICD-10-CM

## 2016-01-29 NOTE — Telephone Encounter (Signed)
Discussed with Alexandra Mathis that her genetic test results were negative for any known pathogenic mutations within any of 44 genes on the [facility] Custom Cancer Panel.  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  Discussed that we treat this like a negative test result, until it gets updated by the lab, and reviewed why we do that.  Encouraged further genetic testing of her sisters who have had breast and/or ovarian cancer and her cousins, if they have not had updated genetic testing.  The family history is still suspicious, although the breast cancer history includes later-onset breast cancer diagnoses.  Discussed with Alexandra Mathis that she should continue to follow her doctors' recommendations for future cancer screening.  Her sisters who are unaffected are likely eligible for breast MRIs.  The women in the family are at a higher risk for breast and ovarian cancer due to the family history and should make their primary physicians aware of this history.  Encouraged Alexandra Mathis to call and update the family history with [place] in the future, as more relatives have genetic testing or if anyone else gets diagnosed with cancer.

## 2016-01-31 ENCOUNTER — Ambulatory Visit: Payer: BC Managed Care – PPO

## 2016-01-31 ENCOUNTER — Other Ambulatory Visit: Payer: BC Managed Care – PPO

## 2016-01-31 ENCOUNTER — Ambulatory Visit (HOSPITAL_BASED_OUTPATIENT_CLINIC_OR_DEPARTMENT_OTHER): Payer: BC Managed Care – PPO

## 2016-01-31 ENCOUNTER — Ambulatory Visit (HOSPITAL_BASED_OUTPATIENT_CLINIC_OR_DEPARTMENT_OTHER): Payer: BC Managed Care – PPO | Admitting: Oncology

## 2016-01-31 VITALS — BP 108/76 | HR 75 | Temp 98.9°F | Resp 18 | Ht 67.5 in | Wt 160.3 lb

## 2016-01-31 DIAGNOSIS — C50412 Malignant neoplasm of upper-outer quadrant of left female breast: Secondary | ICD-10-CM

## 2016-01-31 DIAGNOSIS — C773 Secondary and unspecified malignant neoplasm of axilla and upper limb lymph nodes: Secondary | ICD-10-CM

## 2016-01-31 DIAGNOSIS — Z5112 Encounter for antineoplastic immunotherapy: Secondary | ICD-10-CM

## 2016-01-31 DIAGNOSIS — R5383 Other fatigue: Secondary | ICD-10-CM

## 2016-01-31 DIAGNOSIS — L719 Rosacea, unspecified: Secondary | ICD-10-CM

## 2016-01-31 DIAGNOSIS — Z95828 Presence of other vascular implants and grafts: Secondary | ICD-10-CM

## 2016-01-31 LAB — COMPREHENSIVE METABOLIC PANEL
ALBUMIN: 3.5 g/dL (ref 3.5–5.0)
ALK PHOS: 70 U/L (ref 40–150)
ALT: 18 U/L (ref 0–55)
ANION GAP: 8 meq/L (ref 3–11)
AST: 20 U/L (ref 5–34)
BUN: 14.7 mg/dL (ref 7.0–26.0)
CALCIUM: 9.4 mg/dL (ref 8.4–10.4)
CHLORIDE: 104 meq/L (ref 98–109)
CO2: 27 mEq/L (ref 22–29)
CREATININE: 0.7 mg/dL (ref 0.6–1.1)
EGFR: 89 mL/min/{1.73_m2} — ABNORMAL LOW (ref >90)
Glucose: 100 mg/dl (ref 70–140)
POTASSIUM: 4.3 meq/L (ref 3.5–5.1)
Sodium: 140 mEq/L (ref 136–145)
Total Bilirubin: 0.4 mg/dL (ref 0.20–1.20)
Total Protein: 7 g/dL (ref 6.4–8.3)

## 2016-01-31 LAB — CBC WITH DIFFERENTIAL/PLATELET
BASO%: 1.3 % (ref 0.0–2.0)
Basophils Absolute: 0 10*3/uL (ref 0.0–0.1)
EOS ABS: 0.1 10*3/uL (ref 0.0–0.5)
EOS%: 2.3 % (ref 0.0–7.0)
HEMATOCRIT: 33.5 % — AB (ref 34.8–46.6)
HEMOGLOBIN: 11.4 g/dL — AB (ref 11.6–15.9)
LYMPH#: 1.2 10*3/uL (ref 0.9–3.3)
LYMPH%: 30.1 % (ref 14.0–49.7)
MCH: 30 pg (ref 25.1–34.0)
MCHC: 34 g/dL (ref 31.5–36.0)
MCV: 88.1 fL (ref 79.5–101.0)
MONO#: 0.4 10*3/uL (ref 0.1–0.9)
MONO%: 9.3 % (ref 0.0–14.0)
NEUT%: 57 % (ref 38.4–76.8)
NEUTROS ABS: 2.2 10*3/uL (ref 1.5–6.5)
PLATELETS: 226 10*3/uL (ref 145–400)
RBC: 3.8 10*6/uL (ref 3.70–5.45)
RDW: 14.6 % — ABNORMAL HIGH (ref 11.2–14.5)
WBC: 3.9 10*3/uL (ref 3.9–10.3)

## 2016-01-31 MED ORDER — FAMOTIDINE IN NACL 20-0.9 MG/50ML-% IV SOLN
20.0000 mg | Freq: Once | INTRAVENOUS | Status: AC
  Administered 2016-01-31: 20 mg via INTRAVENOUS

## 2016-01-31 MED ORDER — SODIUM CHLORIDE 0.9 % IV SOLN
80.0000 mg/m2 | Freq: Once | INTRAVENOUS | Status: AC
  Administered 2016-01-31: 144 mg via INTRAVENOUS
  Filled 2016-01-31: qty 24

## 2016-01-31 MED ORDER — LORAZEPAM 2 MG/ML IJ SOLN
INTRAMUSCULAR | Status: AC
  Filled 2016-01-31: qty 1

## 2016-01-31 MED ORDER — SODIUM CHLORIDE 0.9 % IV SOLN
Freq: Once | INTRAVENOUS | Status: AC
  Administered 2016-01-31: 11:00:00 via INTRAVENOUS

## 2016-01-31 MED ORDER — SODIUM CHLORIDE 0.9 % IV SOLN
4.0000 mg | Freq: Once | INTRAVENOUS | Status: AC
  Administered 2016-01-31: 4 mg via INTRAVENOUS
  Filled 2016-01-31: qty 0.4

## 2016-01-31 MED ORDER — SODIUM CHLORIDE 0.9% FLUSH
10.0000 mL | INTRAVENOUS | Status: DC | PRN
  Administered 2016-01-31: 10 mL
  Filled 2016-01-31: qty 10

## 2016-01-31 MED ORDER — LORAZEPAM 2 MG/ML IJ SOLN
0.5000 mg | Freq: Once | INTRAMUSCULAR | Status: AC
  Administered 2016-01-31: 0.5 mg via INTRAVENOUS

## 2016-01-31 MED ORDER — SODIUM CHLORIDE 0.9 % IJ SOLN
10.0000 mL | INTRAMUSCULAR | Status: DC | PRN
Start: 2016-01-31 — End: 2016-01-31
  Administered 2016-01-31: 10 mL via INTRAVENOUS
  Filled 2016-01-31: qty 10

## 2016-01-31 MED ORDER — METRONIDAZOLE 1 % EX GEL: Freq: Every day | CUTANEOUS | 0 refills | Status: DC

## 2016-01-31 MED ORDER — FAMOTIDINE IN NACL 20-0.9 MG/50ML-% IV SOLN
INTRAVENOUS | Status: AC
  Filled 2016-01-31: qty 50

## 2016-01-31 MED ORDER — HEPARIN SOD (PORK) LOCK FLUSH 100 UNIT/ML IV SOLN
500.0000 [IU] | Freq: Once | INTRAVENOUS | Status: AC | PRN
  Administered 2016-01-31: 500 [IU]
  Filled 2016-01-31: qty 5

## 2016-01-31 NOTE — Patient Instructions (Signed)
[facility] Discharge Instructions for Patients Receiving Chemotherapy  Today you received the following chemotherapy agents Taxol   To help prevent nausea and vomiting after your treatment, we encourage you to take your nausea medication as directed.   If you develop nausea and vomiting that is not controlled by your nausea medication, call the clinic.   BELOW ARE SYMPTOMS THAT SHOULD BE REPORTED IMMEDIATELY:  *FEVER GREATER THAN 100.5 F  *CHILLS WITH OR WITHOUT FEVER  NAUSEA AND VOMITING THAT IS NOT CONTROLLED WITH YOUR NAUSEA MEDICATION  *UNUSUAL SHORTNESS OF BREATH  *UNUSUAL BRUISING OR BLEEDING  TENDERNESS IN MOUTH AND THROAT WITH OR WITHOUT PRESENCE OF ULCERS  *URINARY PROBLEMS  *BOWEL PROBLEMS  UNUSUAL RASH Items with * indicate a potential emergency and should be followed up as soon as possible.  Feel free to call the clinic you have any questions or concerns. The clinic phone number is [phone].  Please show the CHEMO ALERT CARD at check-in to the Emergency Department and triage nurse.   

## 2016-01-31 NOTE — Progress Notes (Signed)
[facility]  [phone] Fax:([phone]) [phone]     ID: VIHA KRIEGEL DOB: 1956/03/11  MR#: [id number]  NTI#:[id number]  Patient Care Team: Darrol Jump, PA-C as PCP - General (Family Medicine) Fanny Skates, MD as Consulting Physician (General Surgery) Chauncey Cruel, MD as Consulting Physician (Oncology) Kyung Rudd, MD as Consulting Physician (Radiation Oncology) Larey Dresser, MD as Consulting Physician (Cardiology) Irene Limbo, MD as Consulting Physician (Plastic Surgery) Megan Salon, MD as Consulting Physician (Gynecology) OTHER MD:  CHIEF COMPLAINT: Triple positive breast cancer  CURRENT TREATMENT: Paclitaxel, trastuzumab, and pertuzumab   BREAST CANCER HISTORY: From the original intake note:  "Alexandra Mathis" had screening mammography in June 2017 showing upper outer quadrant calcifications. Accordingly she was referred for left diagnostic mammography and ultrasonography, performed 11/27/2015. The breast density was category C. Compression views of the left breast found pleomorphic calcifications in the upper outer quadrant measuring up to 4.0 cm. There was an area of associated architectural distortion in the upper outer quadrant as well. On physical exam there was an irregular thickening in the left breast at the 2:00 position anteriorly. Ultrasonography confirmed a hypoechoic irregular mass measuring 1.6 cm in the 2:00 position 2 cm from the nipple. An intramammary lymph node was noted with benign appearing. In the left axilla there was a lymph node with focally thickened cortex.  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 Biopsy of the left  axillary lymph node was negative.  Her subsequent history is as detailed below  INTERVAL HISTORY: Alexandra Mathis returns today for follow-up of her triple positive breast cancer accompanied by her husband [name]. Today is day 1 cycle 5 of Taxol which Alexandra Mathis will receive weekly 12. Today is also day 15  cycle 2 of trastuzumab and pertuzumab which she will receive every 21 days during the Taxol treatments.  REVIEW OF SYSTEMS: Alexandra Mathis continues to do generally quite well with her treatments. She has been a little bit more fatigued this time and several days to take a nap in the afternoon. She also continues to have problems with epistaxis, although that is slightly better since she started taking the otrivin. She has developed a rash on her cheeks which she wanted me to look at today. Aside from these issues a detailed review of systems today was stable  PAST MEDICAL HISTORY: Past Medical History:  Diagnosis Date  . Breast cancer ([facility])   . Breast cancer of upper-outer quadrant of left female breast ([facility]) 12/05/2015  . Chest pain   . Family history of premature CAD   . HLD (hyperlipidemia)     PAST SURGICAL HISTORY: Past Surgical History:  Procedure Laterality Date  . ANTERIOR CRUCIATE LIGAMENT REPAIR     right knee  . CESAREAN SECTION     x4  . GANGLION CYST EXCISION    . LAPAROSCOPIC ENDOMETRIOSIS FULGURATION    . PARTIAL HYSTERECTOMY    . PORTACATH PLACEMENT Right 12/25/2015   Procedure: INSERTION PORT-A-CATH WITH [place];  Surgeon: Fanny Skates, MD;  Location: [facility];  Service: General;  Laterality: Right;  . TONSILLECTOMY    . WISDOM TOOTH EXTRACTION      FAMILY HISTORY Family History  Problem Relation Age of Onset  .  Other Mother     benign meningioma dx 17 s/p surgery; hx of hysterectomy for hemorrhaging  . Hypothyroidism Mother   . CAD Mother   . Colon cancer Father 63    w/ mets to lungs and liver; + chemo; d. 51  . Gout Father   . Arthritis Father   . Ovarian cancer Sister      early 27s; s/p USO  . Breast cancer Sister 17    s/p mastectomy and aromatase inhibitor  . Other Paternal Aunt 88    Primary peritoneal cancer; s/p surgery  . Cancer Paternal Uncle   . Ovarian cancer Sister 44    "pre-cancer"; s/p BSO  . Cervical cancer Sister   . Basal cell carcinoma Sister   . Colon polyps Sister     unspecified number  . Breast cancer Cousin 22    s/p mastectomy  . Ovarian cancer Cousin 42    s/p BSO  . Leukemia Cousin 73  . Cancer Cousin     dx carcinoid bowel tumor  . Other Cousin     reportedly negative "BRCA1/2 testing"  . Breast cancer Cousin 53    reportedly negative GT is 2014-2015  . CAD Maternal Grandmother     d. 90s  . Heart attack Maternal Grandfather 51  . Heart Problems Paternal Grandmother   . Heart Problems Paternal Grandfather   . CAD Brother     triple vessel CAD, d. 50  . Other Son     enlarged prostate w/ surveillance  . Thyroid cancer Maternal Uncle 71    unspecified type  . CAD Maternal Uncle     d. 11  . Other Cousin 48    dx benign "skin polyp"  . Breast cancer Other     paternal great aunt (PGM's sister)  . Breast cancer Other     paternal great aunt (PGM's sister)    GYNECOLOGIC HISTORY:  No LMP recorded. Patient is postmenopausal. Menarche age 58, first live birth age 70. The patient is GX P4. She stopped having periods in 1989. She did not use hormone replacement. She used oral contraceptives less than one year.  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 Her husband [name] is retired from a [employer]. Son [name] lives in [place] and works as a Chief Strategy Officer. Son [name] lives in [place] and is with the [employer]. Son [name] this and [place] where he works as an Chief Financial Officer. Son [name] lives in [place] were he works as an Chief Financial Officer. The patient has 4 grandchildren including a 50-year-old they have custody of and lives with them. The patient attends our [employer] [employer]   HEALTH MAINTENANCE: Social History  Substance Use Topics  . Smoking status: Former Smoker    Years: 0.50    Types: Cigarettes    Quit date: 05/25/1974  . Smokeless tobacco: Never Used     Comment: was only a social/weekend smoker; smoked for 6 mos  . Alcohol use 0.5 oz/week    1 Standard drinks or equivalent per week     Comment: maybe 2-3 drinks per month     Colonoscopy:  PAP:  Bone density:   Allergies  Allergen Reactions  . Morphine And Related   . Vicodin [Hydrocodone-Acetaminophen]     Current Outpatient Prescriptions  Medication Sig Dispense Refill  . acetaminophen (TYLENOL) 325 MG tablet Take 2 tablets (650 mg  total) by mouth every 4 (four) hours as needed for mild pain (or Fever >/= 101).    Marland Kitchen acetaminophen-codeine (TYLENOL #3) 300-30 MG tablet Take 1-2 tablets by mouth every 4 (four) hours as needed for moderate pain. 40 tablet 0  . aspirin 81 MG tablet Take 81 mg by mouth daily.    Marland Kitchen atorvastatin (LIPITOR) 40 MG tablet Take 40 mg by mouth daily.  5  . Cetirizine HCl 10 MG CAPS Take 10 mg by mouth.    . Cetirizine HCl 10 MG CAPS Take 1 capsule (10 mg total) by mouth 1 day or 1 dose. 30 capsule   . Cholecalciferol 1000 units capsule Take 1,000 Units by mouth daily.    Marland Kitchen etodolac (LODINE) 400 MG tablet Take 400 mg by mouth 2 (two) times daily as needed for mild pain.   2  . FIBER COMPLETE PO Take by mouth daily.      Marland Kitchen lidocaine-prilocaine (EMLA) cream Apply to affected area once 30 g 3  . LORazepam (ATIVAN) 0.5 MG tablet Take 1 tablet (0.5 mg total) by mouth every 8 (eight) hours as needed for anxiety. 30 tablet 0  . methylPREDNISolone (MEDROL DOSEPAK) 4 MG TBPK tablet Take as directed 21 tablet 0  . Multiple Vitamin (MULTIVITAMIN) tablet Take 1 tablet by mouth daily.      Marland Kitchen omeprazole (PRILOSEC) 20 MG capsule Take 20 mg by mouth daily.    . ondansetron (ZOFRAN) 8 MG tablet Take 1 tablet (8 mg total) by mouth  2 (two) times daily as needed (Nausea or vomiting). 30 tablet 1  . Probiotic Product (ALIGN PO) Take by mouth.    . prochlorperazine (COMPAZINE) 10 MG tablet Take 1 tablet (10 mg total) by mouth every 6 (six) hours as needed (Nausea or vomiting). 30 tablet 1  . Vitamins C E (CRANBERRY CONCENTRATE PO) Take by mouth daily.       No current facility-administered medications for this visit.    Facility-Administered Medications Ordered in Other Visits  Medication Dose Route Frequency Provider Last Rate Last Dose  . sodium chloride flush (NS) 0.9 % injection 10 mL  10 mL Intracatheter PRN Chauncey Cruel, MD   10 mL at 12/27/15 1630    OBJECTIVE: Middle-aged white woman Who appears stated age Vitals:   01/31/16 0939  BP: 108/76  Pulse: 75  Resp: 18  Temp: 98.9 F (37.2 C)     Body mass index is 24.74 kg/m.    ECOG FS:1 - Symptomatic but completely ambulatory  Sclerae unicteric, pupils round and equal Oropharynx clear and moist-- no thrush or other lesions No cervical or supraclavicular adenopathy Lungs no rales or rhonchi Heart regular rate and rhythm Abd soft, nontender, positive bowel sounds MSK no focal spinal tenderness, no upper extremity lymphedema Neuro: nonfocal, well oriented, appropriate affect Breasts: Deferred   LAB RESULTS:  CMP     Component Value Date/Time   NA 140 01/31/2016 0825   K 4.3 01/31/2016 0825   CO2 27 01/31/2016 0825   GLUCOSE 100 01/31/2016 0825   BUN 14.7 01/31/2016 0825   CREATININE 0.7 01/31/2016 0825   CALCIUM 9.4 01/31/2016 0825   PROT 7.0 01/31/2016 0825   ALBUMIN 3.5 01/31/2016 0825   AST 20 01/31/2016 0825   ALT 18 01/31/2016 0825   ALKPHOS 70 01/31/2016 0825   BILITOT 0.40 01/31/2016 0825    INo results found for: SPEP, UPEP  Lab Results  Component Value Date   WBC 3.9 01/31/2016  NEUTROABS 2.2 01/31/2016   HGB 11.4 (L) 01/31/2016   HCT 33.5 (L) 01/31/2016   MCV 88.1 01/31/2016   PLT 226 01/31/2016      Chemistry       Component Value Date/Time   NA 140 01/31/2016 0825   K 4.3 01/31/2016 0825   CO2 27 01/31/2016 0825   BUN 14.7 01/31/2016 0825   CREATININE 0.7 01/31/2016 0825      Component Value Date/Time   CALCIUM 9.4 01/31/2016 0825   ALKPHOS 70 01/31/2016 0825   AST 20 01/31/2016 0825   ALT 18 01/31/2016 0825   BILITOT 0.40 01/31/2016 0825       No results found for: LABCA2  No components found for: LABCA125  No results for input(s): INR in the last 168 hours.  Urinalysis No results found for: COLORURINE, APPEARANCEUR, LABSPEC, PHURINE, GLUCOSEU, HGBUR, BILIRUBINUR, KETONESUR, PROTEINUR, UROBILINOGEN, NITRITE, LEUKOCYTESUR   STUDIES: No results found.   ELIGIBLE FOR AVAILABLE RESEARCH PROTOCOL: no  ASSESSMENT: 60 y.o. [place], [place] woman status post left breast upper outer quadrant biopsy 12/02/2015 for a clinical T1c pN0, stage IA  invasive ductal carcinoma, grade 3, estrogen and progesterone receptor positive, with an MIB-1 of 15%, and HER-2 amplification  (a) biopsy of a 0.6 cm lower outer quadrant right breast mass scheduled for 12/26/2015  (1) neoadjuvant chemotherapy consisting of weekly paclitaxel 12 with trastuzumab and pertuzumab every 21 days 4 started 12/27/2015  (2) trastuzumab to be continued to total of one year  (a) echo 12/16/2015 showed an ejection fraction in the 55-60% range.  (3) definitive surgery to follow chemotherapy: considering bilateral mastectomies  (4) adjuvant radiation to follow as appropriate  (5) anti-estrogens to follow at the completion of local treatment  (a) patient is considering bilateral salpingo-oophorectomy  (6) genetics testing 01/09/2016-- negative   PLAN: Alexandra Mathis is beginning to have some fatigue as her chief problem with her chemotherapy treatments. She does have issues with epistaxis and mild hair loss, but she is managing these well.  A new problem is rosacea. This is likely due to the dexamethasone that she receives  with her antibodies. We discussed [facility] today and I went ahead and wrote her a prescription.  She is benefiting from lorazepam given with the chemotherapy. This seems to take away the shakiness that she develops later so she is actually using no lorazepam at home now.  I discussed with her that if she needs to take a week off the Taxol that would be perfectly acceptable. However when she really wants to do is finished as soon as possible.  She will return next week for treatment with Taxol and the antibodies and then see me again 2 weeks from now. She knows to call for any problems that may develop before the next visit. :Chauncey Cruel, MD   01/31/2016 10:27 AM Medical Oncology and Hematology [facility] [place], [place] [address] Tel. [phone]    Fax. [phone]

## 2016-02-02 DIAGNOSIS — Z1379 Encounter for other screening for genetic and chromosomal anomalies: Secondary | ICD-10-CM | POA: Insufficient documentation

## 2016-02-02 NOTE — Progress Notes (Signed)
GENETIC TEST RESULT  HPI: Alexandra Mathis was previously seen in the [facility] clinic due to a personal history of breast cancer, family history of breast, ovarian, colon, and other cancers, and concerns regarding a hereditary predisposition to cancer. Please refer to our prior cancer genetics clinic note from January 09, 2016 for more information regarding Alexandra Mathis's medical, social and family histories, and our assessment and recommendations, at the time. Alexandra Mathis recent genetic test results were disclosed to her, as were recommendations warranted by these results. These results and recommendations are discussed in more detail below.  GENETIC TEST RESULTS: At the time of Alexandra Mathis's visit on 01/09/16, we recommended she pursue genetic testing of a 44-gene Custom Panel through [facility] in [place], [place].  This 44-gene [facility] Custom Panel performed by [facility], [place]) includes sequencing and/or deletion/duplication analysis for the following genes: APC, ATM, AXIN2, BARD1, BMPR1A, BRCA1, BRCA2, BRIP1, CDH1, CDKN2A, CHEK2, DICER1, EPCAM, GREM1, KIT, MEN1, MLH1, MSH2, MSH6, MUTYH, NBN, NF1, NF2, PALB2, PDGFRA, PMS2, POLD1, POLE, PTEN, RAD50, RAD51C, RAD51D, SDHA, SDHB, SDHC, SDHD, SMAD4, SMARCA4, SMARE1, STK11, TP53, TSC1, TSC2, and VHL.  Those results are now back, the report date for which is January 24, 2016.  Genetic testing was normal, and did not reveal a known deleterious mutation in these genes.  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  The test report will be scanned into EPIC and will be located under the Results Review tab in the Pathology>Molecular Pathology section.   We discussed with Ms. Ghuman that since the current genetic testing is not perfect, it is possible there may be a gene mutation in one of these genes that current testing cannot detect, but that chance is small. We also discussed,  that it is possible that another gene that has not yet been discovered, or that we have not yet tested, is responsible for the cancer diagnoses in the family, and it is, therefore, important to remain in touch with cancer genetics in the future so that we can continue to offer Alexandra Mathis the most up-to-date genetic testing.   Genetic testing did identify a variant of uncertain significance (VUS) called "[id number] (p.Ala564Gly)" in one copy of the DICER1 gene. At this time, it is unknown if this VUS is associated with an increased risk for cancer or if this is a normal finding. Since this VUS result is uncertain, it cannot help guide screening recommendations, and family members should not be tested for this VUS to help define their own cancer risks.  Also, we all have variants within our genes that make [place] unique individuals--most of these variants are benign.  Thus, we treat this VUS as a negative result.   With time, we suspect the lab will reclassify this variant and when they do, we will try to re-contact Ms. Upshaw to discuss the reclassification further.  We also encouraged Alexandra Mathis to contact us in a year or two to obtain an update on the status of this VUS.   CANCER SCREENING RECOMMENDATIONS: Given Ms. Care personal and family histories, we must interpret these negative results with some caution.  Families with features suggestive of hereditary risk for cancer tend to have multiple family members with cancer, diagnoses in multiple generations and diagnoses before the age of 8. Alexandra Mathis's family exhibits some of these features. Thus this result may simply reflect our current inability to detect all mutations within these genes or there  may be a different gene that has not yet been discovered or tested.  Additionally, there could be a mutation in the family that Alexandra Mathis herself just did not inherit.  Further testing of other affected relatives would be helpful to further elucidation  of the familial and personal cancer risks.  One aspect that may be reassuring about the family history is that, although there are multiple cases of breast cancer in the family, the 77 of these breast cancers were diagnosed over the age of 5.  Alexandra Mathis and other women in her family are at a higher risk for breast and ovarian cancer, simply due to the family history.  If other family members have genetic testing and we still continue to have no explanation for the history in the family, it may be sensible that they are offered additional breast cancer screening options, such as breast MRIs in the future.  Alexandra Mathis and her sisters should also discuss with their doctors the family history of ovarian cancer.  Alexandra Mathis should continue to follow the screening recommendations of her oncology and primary care providers.  RECOMMENDATIONS FOR FAMILY MEMBERS: Women in this family might be at an increased risk of developing cancer, over the general population risk, simply due to the family history of cancer. We recommended women in this family have a yearly mammogram beginning at age 32, or 31 years younger than the earliest onset of cancer, an annual clinical breast exam, and perform monthly breast self-exams. Women in this family should also have a gynecological exam as recommended by their primary provider. All family members should have a colonoscopy by age 25.  Again, women in the family, such as Alexandra Mathis's sisters are likely eligible for breast MRIs.    Based on Alexandra Mathis's family history, we recommended her sisters and paternal first cousins, who have been diagnosed with breast and ovarian cancers, have genetic counseling and testing. Alexandra Mathis will let us know if we can be of any assistance in coordinating genetic counseling and/or testing for these family members.  Family members who live in other cities can search for a cancer genetic counselor by zip code through the website of  the [employer] ([employer]).  FOLLOW-UP: Lastly, we discussed with Alexandra Mathis that cancer genetics is a rapidly advancing field and it is possible that new genetic tests will be appropriate for her and/or her family members in the future. We encouraged her to remain in contact with cancer genetics on an annual basis so we can update her personal and family histories and let her know of advances in cancer genetics that may benefit this family.   Our contact number was provided. Alexandra Mathis questions were answered to her satisfaction, and she knows she is welcome to call us at anytime with additional questions or concerns.   Jeanine Luz, MS, [facility] Certified Genetic Counselor [email or web address] Phone: [phone]

## 2016-02-07 ENCOUNTER — Ambulatory Visit: Payer: BC Managed Care – PPO

## 2016-02-07 ENCOUNTER — Encounter: Payer: Self-pay | Admitting: *Deleted

## 2016-02-07 ENCOUNTER — Other Ambulatory Visit: Payer: Self-pay | Admitting: Oncology

## 2016-02-07 ENCOUNTER — Other Ambulatory Visit (HOSPITAL_BASED_OUTPATIENT_CLINIC_OR_DEPARTMENT_OTHER): Payer: BC Managed Care – PPO

## 2016-02-07 ENCOUNTER — Ambulatory Visit (HOSPITAL_BASED_OUTPATIENT_CLINIC_OR_DEPARTMENT_OTHER): Payer: BC Managed Care – PPO

## 2016-02-07 VITALS — BP 117/74 | HR 80 | Temp 98.9°F | Resp 18

## 2016-02-07 DIAGNOSIS — C50412 Malignant neoplasm of upper-outer quadrant of left female breast: Secondary | ICD-10-CM

## 2016-02-07 DIAGNOSIS — Z5111 Encounter for antineoplastic chemotherapy: Secondary | ICD-10-CM | POA: Diagnosis not present

## 2016-02-07 DIAGNOSIS — C773 Secondary and unspecified malignant neoplasm of axilla and upper limb lymph nodes: Secondary | ICD-10-CM | POA: Diagnosis not present

## 2016-02-07 DIAGNOSIS — Z5112 Encounter for antineoplastic immunotherapy: Secondary | ICD-10-CM

## 2016-02-07 LAB — COMPREHENSIVE METABOLIC PANEL
ALBUMIN: 3.4 g/dL — AB (ref 3.5–5.0)
ALK PHOS: 83 U/L (ref 40–150)
ALT: 20 U/L (ref 0–55)
AST: 20 U/L (ref 5–34)
Anion Gap: 8 mEq/L (ref 3–11)
BUN: 14.8 mg/dL (ref 7.0–26.0)
CHLORIDE: 107 meq/L (ref 98–109)
CO2: 26 mEq/L (ref 22–29)
CREATININE: 0.7 mg/dL (ref 0.6–1.1)
Calcium: 9.1 mg/dL (ref 8.4–10.4)
EGFR: >90 mL/min/{1.73_m2} (ref >90)
GLUCOSE: 99 mg/dL (ref 70–140)
POTASSIUM: 4.5 meq/L (ref 3.5–5.1)
SODIUM: 142 meq/L (ref 136–145)
Total Bilirubin: <0.3 mg/dL (ref 0.20–1.20)
Total Protein: 6.9 g/dL (ref 6.4–8.3)

## 2016-02-07 LAB — CBC WITH DIFFERENTIAL/PLATELET
BASO%: 0.8 % (ref 0.0–2.0)
Basophils Absolute: 0 10*3/uL (ref 0.0–0.1)
EOS%: 1.3 % (ref 0.0–7.0)
Eosinophils Absolute: 0.1 10*3/uL (ref 0.0–0.5)
HEMATOCRIT: 31.9 % — AB (ref 34.8–46.6)
HEMOGLOBIN: 11 g/dL — AB (ref 11.6–15.9)
LYMPH#: 1.3 10*3/uL (ref 0.9–3.3)
LYMPH%: 32.9 % (ref 14.0–49.7)
MCH: 30.4 pg (ref 25.1–34.0)
MCHC: 34.5 g/dL (ref 31.5–36.0)
MCV: 88.1 fL (ref 79.5–101.0)
MONO#: 0.4 10*3/uL (ref 0.1–0.9)
MONO%: 11 % (ref 0.0–14.0)
NEUT%: 54 % (ref 38.4–76.8)
NEUTROS ABS: 2.1 10*3/uL (ref 1.5–6.5)
NRBC: 0 % (ref 0–0)
Platelets: 213 10*3/uL (ref 145–400)
RBC: 3.62 10*6/uL — ABNORMAL LOW (ref 3.70–5.45)
RDW: 14.3 % (ref 11.2–14.5)
WBC: 3.9 10*3/uL (ref 3.9–10.3)

## 2016-02-07 MED ORDER — LORAZEPAM 2 MG/ML IJ SOLN
INTRAMUSCULAR | Status: AC
  Filled 2016-02-07: qty 1

## 2016-02-07 MED ORDER — ACETAMINOPHEN 325 MG PO TABS
650.0000 mg | ORAL_TABLET | Freq: Once | ORAL | Status: AC
  Administered 2016-02-07: 650 mg via ORAL

## 2016-02-07 MED ORDER — PACLITAXEL CHEMO INJECTION 300 MG/50ML: 80.0000 mg/m2 | Freq: Once | INTRAVENOUS | Status: DC

## 2016-02-07 MED ORDER — FAMOTIDINE IN NACL 20-0.9 MG/50ML-% IV SOLN
20.0000 mg | Freq: Once | INTRAVENOUS | Status: AC
  Administered 2016-02-07: 20 mg via INTRAVENOUS

## 2016-02-07 MED ORDER — HEPARIN SOD (PORK) LOCK FLUSH 100 UNIT/ML IV SOLN
500.0000 [IU] | Freq: Once | INTRAVENOUS | Status: AC | PRN
  Administered 2016-02-07: 500 [IU]
  Filled 2016-02-07: qty 5

## 2016-02-07 MED ORDER — SODIUM CHLORIDE 0.9 % IV SOLN
4.0000 mg | Freq: Once | INTRAVENOUS | Status: AC
  Administered 2016-02-07: 4 mg via INTRAVENOUS
  Filled 2016-02-07: qty 0.4

## 2016-02-07 MED ORDER — SODIUM CHLORIDE 0.9% FLUSH
10.0000 mL | INTRAVENOUS | Status: DC | PRN
  Administered 2016-02-07: 10 mL
  Filled 2016-02-07: qty 10

## 2016-02-07 MED ORDER — SODIUM CHLORIDE 0.9 % IV SOLN
420.0000 mg | Freq: Once | INTRAVENOUS | Status: AC
  Administered 2016-02-07: 420 mg via INTRAVENOUS
  Filled 2016-02-07: qty 14

## 2016-02-07 MED ORDER — TRASTUZUMAB CHEMO INJECTION 440 MG
6.0000 mg/kg | Freq: Once | INTRAVENOUS | Status: AC
  Administered 2016-02-07: 420 mg via INTRAVENOUS
  Filled 2016-02-07: qty 20

## 2016-02-07 MED ORDER — SODIUM CHLORIDE 0.9 % IV SOLN
80.0000 mg/m2 | Freq: Once | INTRAVENOUS | Status: AC
  Administered 2016-02-07: 144 mg via INTRAVENOUS
  Filled 2016-02-07: qty 24

## 2016-02-07 MED ORDER — SODIUM CHLORIDE 0.9 % IV SOLN
Freq: Once | INTRAVENOUS | Status: AC
  Administered 2016-02-07: 10:00:00 via INTRAVENOUS

## 2016-02-07 MED ORDER — ACETAMINOPHEN 325 MG PO TABS
ORAL_TABLET | ORAL | Status: AC
  Filled 2016-02-07: qty 2

## 2016-02-07 MED ORDER — FAMOTIDINE IN NACL 20-0.9 MG/50ML-% IV SOLN
INTRAVENOUS | Status: AC
  Filled 2016-02-07: qty 50

## 2016-02-07 MED ORDER — LORAZEPAM 2 MG/ML IJ SOLN
0.5000 mg | Freq: Once | INTRAMUSCULAR | Status: AC
  Administered 2016-02-07: 0.5 mg via INTRAVENOUS

## 2016-02-14 ENCOUNTER — Ambulatory Visit: Payer: BC Managed Care – PPO

## 2016-02-14 ENCOUNTER — Ambulatory Visit (HOSPITAL_BASED_OUTPATIENT_CLINIC_OR_DEPARTMENT_OTHER): Payer: BC Managed Care – PPO | Admitting: Oncology

## 2016-02-14 ENCOUNTER — Other Ambulatory Visit (HOSPITAL_BASED_OUTPATIENT_CLINIC_OR_DEPARTMENT_OTHER): Payer: BC Managed Care – PPO

## 2016-02-14 ENCOUNTER — Ambulatory Visit (HOSPITAL_BASED_OUTPATIENT_CLINIC_OR_DEPARTMENT_OTHER): Payer: BC Managed Care – PPO

## 2016-02-14 VITALS — BP 108/76 | HR 74 | Temp 98.3°F | Resp 18 | Ht 67.5 in | Wt 160.2 lb

## 2016-02-14 DIAGNOSIS — C50412 Malignant neoplasm of upper-outer quadrant of left female breast: Secondary | ICD-10-CM

## 2016-02-14 DIAGNOSIS — Z95828 Presence of other vascular implants and grafts: Secondary | ICD-10-CM

## 2016-02-14 LAB — CBC WITH DIFFERENTIAL/PLATELET
BASO%: 1 % (ref 0.0–2.0)
Basophils Absolute: 0 10*3/uL (ref 0.0–0.1)
EOS%: 1.6 % (ref 0.0–7.0)
Eosinophils Absolute: 0.1 10*3/uL (ref 0.0–0.5)
HEMATOCRIT: 33.7 % — AB (ref 34.8–46.6)
HEMOGLOBIN: 11.4 g/dL — AB (ref 11.6–15.9)
LYMPH#: 1.2 10*3/uL (ref 0.9–3.3)
LYMPH%: 30.4 % (ref 14.0–49.7)
MCH: 30.2 pg (ref 25.1–34.0)
MCHC: 33.8 g/dL (ref 31.5–36.0)
MCV: 89.4 fL (ref 79.5–101.0)
MONO#: 0.3 10*3/uL (ref 0.1–0.9)
MONO%: 8.1 % (ref 0.0–14.0)
NEUT%: 58.9 % (ref 38.4–76.8)
NEUTROS ABS: 2.3 10*3/uL (ref 1.5–6.5)
Platelets: 230 10*3/uL (ref 145–400)
RBC: 3.77 10*6/uL (ref 3.70–5.45)
RDW: 14.7 % — AB (ref 11.2–14.5)
WBC: 3.9 10*3/uL (ref 3.9–10.3)

## 2016-02-14 LAB — COMPREHENSIVE METABOLIC PANEL
ALBUMIN: 3.5 g/dL (ref 3.5–5.0)
ALK PHOS: 80 U/L (ref 40–150)
ALT: 21 U/L (ref 0–55)
AST: 20 U/L (ref 5–34)
Anion Gap: 8 mEq/L (ref 3–11)
BUN: 17.1 mg/dL (ref 7.0–26.0)
CALCIUM: 9.1 mg/dL (ref 8.4–10.4)
CHLORIDE: 107 meq/L (ref 98–109)
CO2: 26 mEq/L (ref 22–29)
CREATININE: 0.7 mg/dL (ref 0.6–1.1)
EGFR: >90 mL/min/{1.73_m2} (ref >90)
GLUCOSE: 104 mg/dL (ref 70–140)
Potassium: 4.5 mEq/L (ref 3.5–5.1)
SODIUM: 141 meq/L (ref 136–145)
Total Bilirubin: 0.31 mg/dL (ref 0.20–1.20)
Total Protein: 7.1 g/dL (ref 6.4–8.3)

## 2016-02-14 MED ORDER — HEPARIN SOD (PORK) LOCK FLUSH 100 UNIT/ML IV SOLN
500.0000 [IU] | Freq: Once | INTRAVENOUS | Status: AC | PRN
  Administered 2016-02-14: 500 [IU] via INTRAVENOUS
  Filled 2016-02-14: qty 5

## 2016-02-14 MED ORDER — SODIUM CHLORIDE 0.9 % IJ SOLN
10.0000 mL | INTRAMUSCULAR | Status: DC | PRN
  Administered 2016-02-14: 10 mL via INTRAVENOUS
  Filled 2016-02-14: qty 10

## 2016-02-14 NOTE — Progress Notes (Signed)
[place]  Telephone:([phone]) [phone] Fax:(336) [phone]     ID: NOVEMBER SYPHER DOB: [birth year]  MR#: [id number]  WLS#:[id number]  Patient Care Team: Darrol Jump, PA-C as PCP - General (Family Medicine) Fanny Skates, MD as Consulting Physician (General Surgery) Chauncey Cruel, MD as Consulting Physician (Oncology) Kyung Rudd, MD as Consulting Physician (Radiation Oncology) Larey Dresser, MD as Consulting Physician (Cardiology) Irene Limbo, MD as Consulting Physician (Plastic Surgery) Megan Salon, MD as Consulting Physician (Gynecology) OTHER MD:  CHIEF COMPLAINT: Triple positive breast cancer  CURRENT TREATMENT: Paclitaxel, trastuzumab, and pertuzumab   BREAST CANCER HISTORY: From the original intake note:  "Alexandra Mathis" had screening mammography in June 2017 showing upper outer quadrant calcifications. Accordingly she was referred for left diagnostic mammography and ultrasonography, performed 11/27/2015. The breast density was category C. Compression views of the left breast found pleomorphic calcifications in the upper outer quadrant measuring up to 4.0 cm. There was an area of associated architectural distortion in the upper outer quadrant as well. On physical exam there was an irregular thickening in the left breast at the 2:00 position anteriorly. Ultrasonography confirmed a hypoechoic irregular mass measuring 1.6 cm in the 2:00 position 2 cm from the nipple. An intramammary lymph node was noted with benign appearing. In the left axilla there was a lymph node with focally thickened cortex.  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 Biopsy of the left  axillary lymph node was negative.  Her subsequent history is as detailed below  INTERVAL HISTORY: Alexandra Mathis returns today for follow-up of her breast cancer accompanied by her husband Alexandra Mathis. Today is day 1 cycle 8 of 12 planned weekly Taxol treatments. Today is also day 8  cycle 3 of trastuzumab and pertuzumab which she receives every 21 days during her chemotherapy  REVIEW OF SYSTEMS: Alexandra Mathis has lost a little more hair but still has quite a bit left. She is working full time and is tired when she gets home. She has a little bit of a runny nose and some arthritis pains here and there. The only real problem is that she has developed tingling in her feet. This is worse when she lays down at night. Her fingers are fine. She has also developed a rash on her arms that she wanted me to look at. A detailed review of systems today was otherwise stable  PAST MEDICAL HISTORY: Past Medical History:  Diagnosis Date  . Breast cancer ([place])   . Breast cancer of upper-outer quadrant of left female breast ([place]) 12/05/2015  . Chest pain   . Family history of premature CAD   . HLD (hyperlipidemia)     PAST SURGICAL HISTORY: Past Surgical History:  Procedure Laterality Date  . ANTERIOR CRUCIATE LIGAMENT REPAIR     right knee  . CESAREAN SECTION     x4  . GANGLION CYST EXCISION    . LAPAROSCOPIC ENDOMETRIOSIS FULGURATION    . PARTIAL HYSTERECTOMY    . PORTACATH PLACEMENT Right 12/25/2015   Procedure: INSERTION PORT-A-CATH WITH [place];  Surgeon: Fanny Skates, MD;  Location: [facility];  Service: General;  Laterality: Right;  . TONSILLECTOMY    . WISDOM TOOTH EXTRACTION      FAMILY HISTORY  Family History  Problem Relation Age of Onset  . Other Mother     benign meningioma dx 49 s/p surgery; hx of hysterectomy for hemorrhaging  . Hypothyroidism Mother   . CAD Mother   . Colon cancer Father 2    w/ mets to lungs and liver; + chemo; d. 63  . Gout Father   . Arthritis Father   . Ovarian cancer Sister      early 43s; s/p USO  . Breast cancer Sister 71    s/p mastectomy and aromatase inhibitor  . Other Paternal Aunt 52    Primary peritoneal cancer; s/p surgery  . Cancer Paternal Uncle   . Ovarian cancer Sister 73    "pre-cancer"; s/p BSO  . Cervical cancer Sister   . Basal cell carcinoma Sister   . Colon polyps Sister     unspecified number  . Breast cancer Cousin 10    s/p mastectomy  . Ovarian cancer Cousin 51    s/p BSO  . Leukemia Cousin 72  . Cancer Cousin     dx carcinoid bowel tumor  . Other Cousin     reportedly negative "BRCA1/2 testing"  . Breast cancer Cousin 37    reportedly negative GT is 2014-2015  . CAD Maternal Grandmother     d. 90s  . Heart attack Maternal Grandfather 51  . Heart Problems Paternal Grandmother   . Heart Problems Paternal Grandfather   . CAD Brother     triple vessel CAD, d. 50  . Other Son     enlarged prostate w/ surveillance  . Thyroid cancer Maternal Uncle 71    unspecified type  . CAD Maternal Uncle     d. 18  . Other Cousin 48    dx benign "skin polyp"  . Breast cancer Other     paternal great aunt (PGM's sister)  . Breast cancer Other     paternal great aunt (PGM's sister)    GYNECOLOGIC HISTORY:  No LMP recorded. Patient is postmenopausal. Menarche age 16, first live birth age 41. The patient is GX P4. She stopped having periods in 1989. She did not use hormone replacement. She used oral contraceptives less than one year.  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 Her husband Alexandra Mathis is retired from a [employer]. Son Alexandra Mathis lives in [place] and works as a Chief Strategy Officer. Son Alexandra Mathis lives in [place] and is with the [employer]. Son Alexandra Mathis this and cocoa [place] where he works as an Chief Financial Officer. Son Alexandra Mathis lives in [place] were he works as an Chief Financial Officer. The patient has 4 grandchildren including a 45-year-old they have custody of and lives with them. The patient attends our [employer]   HEALTH MAINTENANCE: Social History  Substance Use Topics  . Smoking status: Former Smoker    Years: 0.50    Types: Cigarettes    Quit date: 05/25/1974  . Smokeless tobacco: Never Used     Comment: was only a social/weekend smoker; smoked for 6 mos  . Alcohol use 0.5 oz/week    1 Standard drinks or equivalent per week     Comment: maybe 2-3 drinks per month     Colonoscopy:  PAP:  Bone density:   Allergies  Allergen Reactions  . Morphine And Related   . Vicodin [Hydrocodone-Acetaminophen]     Current Outpatient Prescriptions  Medication Sig Dispense Refill  .  acetaminophen (TYLENOL) 325 MG tablet Take 2 tablets (650 mg total) by mouth every 4 (four) hours as needed for mild pain (or Fever >/= 101).    Marland Kitchen acetaminophen-codeine (TYLENOL #3) 300-30 MG tablet Take 1-2 tablets by mouth every 4 (four) hours as needed for moderate pain. 40 tablet 0  . aspirin 81 MG tablet Take 81 mg by mouth daily.    Marland Kitchen atorvastatin (LIPITOR) 40 MG tablet Take 40 mg by mouth daily.  5  . Cetirizine HCl 10 MG CAPS Take 10 mg by mouth.    . Cetirizine HCl 10 MG CAPS Take 1 capsule (10 mg total) by mouth 1 day or 1 dose. 30 capsule   . Cholecalciferol 1000 units capsule Take 1,000 Units by mouth daily.    Marland Kitchen etodolac (LODINE) 400 MG tablet Take 400 mg by mouth 2 (two) times daily as needed for mild pain.   2  . FIBER COMPLETE PO Take by mouth daily.      Marland Kitchen lidocaine-prilocaine (EMLA) cream Apply to affected area once 30 g 3  . LORazepam (ATIVAN) 0.5 MG tablet Take 1 tablet (0.5 mg total) by mouth every 8 (eight) hours as needed for anxiety. 30 tablet 0  . methylPREDNISolone (MEDROL DOSEPAK) 4 MG TBPK tablet Take as directed 21 tablet 0  . metroNIDAZOLE (METROGEL) 1 % gel Apply topically daily. 45 g 0  . Multiple Vitamin (MULTIVITAMIN) tablet Take 1 tablet by mouth daily.      Marland Kitchen omeprazole (PRILOSEC) 20 MG capsule Take 20 mg by mouth daily.    .  ondansetron (ZOFRAN) 8 MG tablet Take 1 tablet (8 mg total) by mouth 2 (two) times daily as needed (Nausea or vomiting). 30 tablet 1  . Probiotic Product (ALIGN PO) Take by mouth.    . prochlorperazine (COMPAZINE) 10 MG tablet Take 1 tablet (10 mg total) by mouth every 6 (six) hours as needed (Nausea or vomiting). 30 tablet 1  . Vitamins C E (CRANBERRY CONCENTRATE PO) Take by mouth daily.       No current facility-administered medications for this visit.    Facility-Administered Medications Ordered in Other Visits  Medication Dose Route Frequency Provider Last Rate Last Dose  . sodium chloride flush (NS) 0.9 % injection 10 mL  10 mL Intracatheter PRN Chauncey Cruel, MD   10 mL at 12/27/15 1630    OBJECTIVE: Middle-aged white woman In no acute distress Vitals:   02/14/16 0859  BP: 108/76  Pulse: 74  Resp: 18  Temp: 98.3 F (36.8 C)     Body mass index is 24.72 kg/m.    ECOG FS:1 - Symptomatic but completely ambulatory  Sclerae unicteric, EOMs intact Oropharynx clear and moist No cervical or supraclavicular adenopathy Lungs no rales or rhonchi Heart regular rate and rhythm Abd soft, nontender, positive bowel sounds MSK no focal spinal tenderness, no upper extremity lymphedema Neuro: nonfocal, well oriented, appropriate affect Breasts: Deferred Skin: Scattered few erythematous scaly lesions over both forearms are likely precancerous actinic changes being "treated" by her chemotherapy   LAB RESULTS:  CMP     Component Value Date/Time   NA 142 02/07/2016 0808   K 4.5 02/07/2016 0808   CO2 26 02/07/2016 0808   GLUCOSE 99 02/07/2016 0808   BUN 14.8 02/07/2016 0808   CREATININE 0.7 02/07/2016 0808   CALCIUM 9.1 02/07/2016 0808   PROT 6.9 02/07/2016 0808   ALBUMIN 3.4 (L) 02/07/2016 0808   AST 20 02/07/2016 0808   ALT 20  02/07/2016 0808   ALKPHOS 83 02/07/2016 0808   BILITOT <0.30 02/07/2016 0808    INo results found for: SPEP, UPEP  Lab Results  Component Value  Date   WBC 3.9 02/14/2016   NEUTROABS 2.3 02/14/2016   HGB 11.4 (L) 02/14/2016   HCT 33.7 (L) 02/14/2016   MCV 89.4 02/14/2016   PLT 230 02/14/2016      Chemistry      Component Value Date/Time   NA 142 02/07/2016 0808   K 4.5 02/07/2016 0808   CO2 26 02/07/2016 0808   BUN 14.8 02/07/2016 0808   CREATININE 0.7 02/07/2016 0808      Component Value Date/Time   CALCIUM 9.1 02/07/2016 0808   ALKPHOS 83 02/07/2016 0808   AST 20 02/07/2016 0808   ALT 20 02/07/2016 0808   BILITOT <0.30 02/07/2016 0808       No results found for: LABCA2  No components found for: [id number]  No results for input(s): INR in the last 168 hours.  Urinalysis No results found for: COLORURINE, APPEARANCEUR, LABSPEC, PHURINE, GLUCOSEU, HGBUR, BILIRUBINUR, KETONESUR, PROTEINUR, UROBILINOGEN, NITRITE, LEUKOCYTESUR   STUDIES: No results found.   ELIGIBLE FOR AVAILABLE RESEARCH PROTOCOL: no  ASSESSMENT: 60 y.o. [place], [place] woman status post left breast upper outer quadrant biopsy 12/02/2015 for a clinical T1c pN0, stage IA  invasive ductal carcinoma, grade 3, estrogen and progesterone receptor positive, with an MIB-1 of 15%, and HER-2 amplification  (a) biopsy of a 0.6 cm lower outer quadrant right breast mass scheduled for 12/26/2015  (1) neoadjuvant chemotherapy consisting of weekly paclitaxel 12 with trastuzumab and pertuzumab every 21 days 4 started 12/27/2015  (2) trastuzumab to be continued to total of one year  (a) echo 12/16/2015 showed an ejection fraction in the 55-60% range.  (3) definitive surgery to follow chemotherapy: considering bilateral mastectomies  (4) adjuvant radiation to follow as appropriate  (5) anti-estrogens to follow at the completion of local treatment  (a) patient is considering bilateral salpingo-oophorectomy  (6) genetics testing 02/09/2016 through the 44-gene [employer] Custom Panel performed by [facility], [place]) found no deleterious  mutations in APC, ATM, AXIN2, BARD1, BMPR1A, BRCA1, BRCA2, BRIP1, CDH1, CDKN2A, CHEK2, DICER1, EPCAM, GREM1, KIT, MEN1, MLH1, MSH2, MSH6, MUTYH, NBN, NF1, NF2, PALB2, PDGFRA, PMS2, POLD1, POLE, PTEN, RAD50, RAD51C, RAD51D, SDHA, SDHB, SDHC, SDHD, SMAD4, SMARCA4, SMARE1, STK11, TP53, TSC1, TSC2, and VHL  (a)   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 This is clearly grade 1, but if we persist it could progress and this can be permanent.   We discussed this at length today and although Alexandra Mathis is so motivated and really wants to continue her chemotherapy, we are holding the paclitaxel today.  She will see me again next week and if the symptoms have completely resolved we will resume the Taxol. A second option would be to switch to weekly carboplatin/gemcitabine for the final 5 cycles. Another option would be to simply stop the chemotherapy at this point and if we decide to do that we will set her up for repeat breast MRI.  She has a good understanding of this plan. She agrees with it. She will call with any problems that may develop before her next visit. :Chauncey Cruel, MD   02/14/2016 9:15 AM [facility]  [address] [place], [place] [address] Tel. [phone]    Fax. [phone]

## 2016-02-14 NOTE — Addendum Note (Signed)
Addended by: Carlene Coria L on: 02/14/2016 09:58 AM   Modules accepted: Orders

## 2016-02-17 ENCOUNTER — Other Ambulatory Visit: Payer: Self-pay | Admitting: General Surgery

## 2016-02-17 ENCOUNTER — Telehealth (HOSPITAL_COMMUNITY): Payer: Self-pay | Admitting: Vascular Surgery

## 2016-02-17 DIAGNOSIS — C50812 Malignant neoplasm of overlapping sites of left female breast: Secondary | ICD-10-CM

## 2016-02-17 NOTE — Telephone Encounter (Signed)
Left pt message to make NP appt w/ ECHO

## 2016-02-18 ENCOUNTER — Encounter: Payer: Self-pay | Admitting: Obstetrics & Gynecology

## 2016-02-18 ENCOUNTER — Other Ambulatory Visit: Payer: Self-pay | Admitting: *Deleted

## 2016-02-18 ENCOUNTER — Ambulatory Visit (INDEPENDENT_AMBULATORY_CARE_PROVIDER_SITE_OTHER): Payer: BC Managed Care – PPO | Admitting: Obstetrics & Gynecology

## 2016-02-18 ENCOUNTER — Ambulatory Visit (HOSPITAL_BASED_OUTPATIENT_CLINIC_OR_DEPARTMENT_OTHER): Payer: BC Managed Care – PPO | Admitting: Oncology

## 2016-02-18 VITALS — BP 118/70 | HR 80 | Resp 16 | Ht 67.0 in | Wt 163.4 lb

## 2016-02-18 VITALS — BP 117/70 | HR 73 | Temp 98.0°F | Resp 18 | Wt 163.1 lb

## 2016-02-18 DIAGNOSIS — C50412 Malignant neoplasm of upper-outer quadrant of left female breast: Secondary | ICD-10-CM | POA: Diagnosis not present

## 2016-02-18 DIAGNOSIS — G620 Drug-induced polyneuropathy: Secondary | ICD-10-CM

## 2016-02-18 DIAGNOSIS — Z01419 Encounter for gynecological examination (general) (routine) without abnormal findings: Secondary | ICD-10-CM | POA: Diagnosis not present

## 2016-02-18 NOTE — Progress Notes (Signed)
60 y.o. G7P4. MarriedCaucasianF here for annual exam.  Pt thought she was also scheduled for an ultrasound today.  She was not.  Is somewhat frustrated with this and her wait.  I understand but do not have an ultrasonographer in the office who can do this today.  Will have to schedule for another day.  H/o triple negative breast cancer and strong family hx.  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  Pt is desirous of BSO but not sure insurance will cover this.  Would like yearly ultrasound at this point.  H/O hysterectomy due to endometriosis and adenomyosis.  Had hx of 4 cesarean sections and had c-hyst with last pregnancy.  She states she really tried hard to talk her ob gyn into this surgery.  H/O prior laparoscopy with fulguration of endometriosis.  No LMP recorded. Patient is postmenopausal.          Sexually active: Yes.    The current method of family planning is post menopausal status.    Exercising: Yes.    elipitical Smoker:  no  Health Maintenance: Pap:  ?1989  History of abnormal Pap:  no MMG:  12/12/15 MRI DCIS Colonoscopy:  2014 normal  BMD:   never TDaP:  unsure Pneumonia vaccine(s):  never Zostavax:   never Hep C testing: will do with PCP  Screening Labs: has labs every Friday, Hb today: same, Urine today: declined   reports that she quit smoking about 41 years ago. Her smoking use included Cigarettes. She quit after 0.50 years of use. She has never used smokeless tobacco. She reports that she drinks about 0.5 oz of alcohol per week . She reports that she does not use drugs.  Past Medical History:  Diagnosis Date  . Breast cancer ([place])   . Breast cancer of upper-outer quadrant of left female breast ([place]) 12/05/2015  . Chest pain   . Family history of premature CAD   . HLD (hyperlipidemia)     Current Outpatient Prescriptions  Medication Sig Dispense Refill  . acetaminophen (TYLENOL) 325 MG tablet Take 2  tablets (650 mg total) by mouth every 4 (four) hours as needed for mild pain (or Fever >/= 101).    Marland Kitchen acetaminophen-codeine (TYLENOL #3) 300-30 MG tablet Take 1-2 tablets by mouth every 4 (four) hours as needed for moderate pain. 40 tablet 0  . aspirin 81 MG tablet Take 81 mg by mouth daily.    Marland Kitchen atorvastatin (LIPITOR) 40 MG tablet Take 40 mg by mouth daily.  5  . Cetirizine HCl 10 MG CAPS Take 10 mg by mouth.    . Cetirizine HCl 10 MG CAPS Take 1 capsule (10 mg total) by mouth 1 day or 1 dose. 30 capsule   . Cholecalciferol 1000 units capsule Take 1,000 Units by mouth daily.    Marland Kitchen etodolac (LODINE) 400 MG tablet Take 400 mg by mouth 2 (two) times daily as needed for mild pain.   2  . FIBER COMPLETE PO Take by mouth daily.      Marland Kitchen LORazepam (ATIVAN) 0.5 MG tablet Take 1 tablet (0.5 mg total) by mouth every 8 (eight) hours as needed for anxiety. 30 tablet 0  . methylPREDNISolone (MEDROL DOSEPAK) 4 MG TBPK tablet Take as directed 21 tablet 0  . metroNIDAZOLE (METROGEL) 1 % gel Apply topically daily. 45 g 0  . Multiple Vitamin (MULTIVITAMIN) tablet Take 1 tablet by mouth daily.      Marland Kitchen omeprazole (  PRILOSEC) 20 MG capsule Take 20 mg by mouth daily.    . Probiotic Product (ALIGN PO) Take by mouth.    . Vitamins C E (CRANBERRY CONCENTRATE PO) Take by mouth daily.       No current facility-administered medications for this visit.    Facility-Administered Medications Ordered in Other Visits  Medication Dose Route Frequency Provider Last Rate Last Dose  . sodium chloride flush (NS) 0.9 % injection 10 mL  10 mL Intracatheter PRN Chauncey Cruel, MD   10 mL at 12/27/15 1630    Family History  Problem Relation Age of Onset  . Other Mother     benign meningioma dx 41 s/p surgery; hx of hysterectomy for hemorrhaging  . Hypothyroidism Mother   . CAD Mother   . Colon cancer Father 39    w/ mets to lungs and liver; + chemo; d. 77  . Gout Father   . Arthritis Father   . Ovarian cancer Sister      early 95s; s/p USO  . Breast cancer Sister 14    s/p mastectomy and aromatase inhibitor  . Other Paternal Aunt 12    Primary peritoneal cancer; s/p surgery  . Cancer Paternal Uncle   . Ovarian cancer Sister 84    "pre-cancer"; s/p BSO  . Cervical cancer Sister   . Basal cell carcinoma Sister   . Colon polyps Sister     unspecified number  . Breast cancer Cousin 68    s/p mastectomy  . Ovarian cancer Cousin 64    s/p BSO  . Leukemia Cousin 89  . Cancer Cousin     dx carcinoid bowel tumor  . Other Cousin     reportedly negative "BRCA1/2 testing"  . Breast cancer Cousin 62    reportedly negative GT is 2014-2015  . CAD Maternal Grandmother     d. 90s  . Heart attack Maternal Grandfather 51  . Heart Problems Paternal Grandmother   . Heart Problems Paternal Grandfather   . CAD Brother     triple vessel CAD, d. 62  . Other Son     enlarged prostate w/ surveillance  . Thyroid cancer Maternal Uncle 71    unspecified type  . CAD Maternal Uncle     d. 61  . Other Cousin 48    dx benign "skin polyp"  . Breast cancer Other     paternal great aunt (PGM's sister)  . Breast cancer Other     paternal great aunt (PGM's sister)    ROS:  Pertinent items are noted in HPI.  Otherwise, a comprehensive ROS was negative.  Exam:   BP 118/70 (BP Location: Right Arm, Patient Position: Sitting, Cuff Size: Normal)   Pulse 80   Resp 16   Ht '5\' 7"'  (1.702 m)   Wt 163 lb 6.4 oz (74.1 kg)   BMI 25.59 kg/m      Height: '5\' 7"'  (170.2 cm)  Ht Readings from Last 3 Encounters:  02/18/16 '5\' 7"'  (1.702 m)  02/14/16 5' 7.5" (1.715 m)  01/31/16 5' 7.5" (1.715 m)    General appearance: alert, cooperative and appears stated age Head: Normocephalic, without obvious abnormality, atraumatic Neck: no adenopathy, supple, symmetrical, trachea midline and thyroid normal to inspection and palpation Lungs: clear to auscultation bilaterally Breasts: Left breast firmness about 3-4cm across jut above areola, no  LAD palpable, right breast mass with nodule where prior biopsy was done.  No skin changes.  No nipple discharge. Heart: regular  rate and rhythm Abdomen: soft, non-tender; bowel sounds normal; no masses,  no organomegaly Extremities: extremities normal, atraumatic, no cyanosis or edema Skin: Skin color, texture, turgor normal. No rashes or lesions Lymph nodes: Cervical, supraclavicular, and axillary nodes normal. No abnormal inguinal nodes palpated Neurologic: Grossly normal  Pelvic: External genitalia:  no lesions              Urethra:  normal appearing urethra with no masses, tenderness or lesions              Bartholins and Skenes: normal                 Vagina: normal appearing vagina with normal color and discharge, no lesions              Cervix: absent              Pap taken: No. Bimanual Exam:  Uterus:  uterus absent              Adnexa: normal adnexa and no mass, fullness, tenderness               Rectovaginal: Confirms               Anus:  normal sphincter tone, no lesions  Chaperone was present for exam.  A:  Well Woman with normal exam Strong family hx of breast/ovarian cancer Triple negative breast cancer H/O 4 prior cesarean sections, last was c/hyst due to endometriosis and adenomyosis Prior laparoscopy due to endometriosis  P:   Mammogram screening recommendations will be made after current treatment is completed pap smear not indicated PUS will be done.  Pt will be called about this tomorrow. Will precert BSO to see about insurance coverage at this time however she cannot have anything done until 2018 due to current breast cancer treatment plans (bilateral mastectomy and tissue expander placement planned.)

## 2016-02-19 ENCOUNTER — Telehealth: Payer: Self-pay | Admitting: *Deleted

## 2016-02-19 DIAGNOSIS — Z853 Personal history of malignant neoplasm of breast: Secondary | ICD-10-CM

## 2016-02-19 DIAGNOSIS — Z8543 Personal history of malignant neoplasm of ovary: Secondary | ICD-10-CM

## 2016-02-19 NOTE — Telephone Encounter (Signed)
Left message to return call to [facility] at [phone].  -Need to schedule PUS for History of Breast and Ovarian CA. No precertification needed per Dr. Sabra Heck

## 2016-02-19 NOTE — Progress Notes (Signed)
[place]  [phone] [phone]     ID: Alexandra Mathis DOB: Aug 09, 1955  MR#: [id number]  OXB#:[id number]  Patient Care Team: Darrol Jump, PA-C as PCP - General (Family Medicine) Fanny Skates, MD as Consulting Physician (General Surgery) Chauncey Cruel, MD as Consulting Physician (Oncology) Kyung Rudd, MD as Consulting Physician (Radiation Oncology) Larey Dresser, MD as Consulting Physician (Cardiology) Irene Limbo, MD as Consulting Physician (Plastic Surgery) Megan Salon, MD as Consulting Physician (Gynecology) OTHER MD:  CHIEF COMPLAINT: Triple positive breast cancer  CURRENT TREATMENT: Carboplatin, gemcitabine, trastuzumab, and pertuzumab   BREAST CANCER HISTORY: From the original intake note:  "Alexandra Mathis" had screening mammography in June 2017 showing upper outer quadrant calcifications. Accordingly she was referred for left diagnostic mammography and ultrasonography, performed 11/27/2015. The breast density was category C. Compression views of the left breast found pleomorphic calcifications in the upper outer quadrant measuring up to 4.0 cm. There was an area of associated architectural distortion in the upper outer quadrant as well. On physical exam there was an irregular thickening in the left breast at the 2:00 position anteriorly. Ultrasonography confirmed a hypoechoic irregular mass measuring 1.6 cm in the 2:00 position 2 cm from the nipple. An intramammary lymph node was noted with benign appearing. In the left axilla there was a lymph node with focally thickened cortex.  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 Biopsy  of the left axillary lymph node was negative.  Her subsequent history is as detailed below  INTERVAL HISTORY: Alexandra Mathis returns today to discuss how to modify her chemotherapy in light of her developing neuropathy. The neuropathy, which is only in her feet, remains grade 1, but it is steady. It has not been managed or progressed. It is just a warning sign that we were waiting for to let us know she would not be able to tolerate any more taxanes.  REVIEW OF SYSTEMS: Aside from the issue of neuropathy, review of systems today was entirely stable.  PAST MEDICAL HISTORY: Past Medical History:  Diagnosis Date  . Breast cancer ([place])   . Breast cancer of upper-outer quadrant of left female breast ([place]) 12/05/2015  . Chest pain   . Family history of premature CAD   . Fibroid   . HLD (hyperlipidemia)     PAST SURGICAL HISTORY: Past Surgical History:  Procedure Laterality Date  . ANTERIOR CRUCIATE LIGAMENT REPAIR     right knee  . CESAREAN SECTION     x 4, last was cesarean hysterectomy  . cesarean section/hysterectomy     done with last cesarean section  . GANGLION CYST EXCISION    . LAPAROSCOPIC ENDOMETRIOSIS FULGURATION    . PORTACATH PLACEMENT Right 12/25/2015   Procedure: INSERTION PORT-A-CATH WITH [place];  Surgeon: Fanny Skates, MD;  Location: [facility];  Service: General;  Laterality: Right;  . TONSILLECTOMY    . WISDOM TOOTH EXTRACTION      FAMILY HISTORY Family History  Problem Relation Age of Onset  . Other Mother     benign meningioma dx 69 s/p surgery; hx of hysterectomy for hemorrhaging  . Hypothyroidism Mother   . CAD Mother   . Colon cancer Father 78    w/  mets to lungs and liver; + chemo; d. 75  . Gout Father   . Arthritis Father   . Ovarian cancer Sister     early 72s; s/p USO  . Breast cancer Sister 33    s/p mastectomy and aromatase inhibitor  . Other Paternal Aunt 58    Primary peritoneal cancer; s/p surgery  . Cancer Paternal Uncle   . Ovarian cancer Sister 27      "pre-cancer"; s/p BSO  . Cervical cancer Sister   . Basal cell carcinoma Sister   . Colon polyps Sister     unspecified number  . Breast cancer Cousin 77    s/p mastectomy  . Ovarian cancer Cousin 79    s/p BSO  . Leukemia Cousin 64  . Cancer Cousin     dx carcinoid bowel tumor  . Other Cousin     reportedly negative "BRCA1/2 testing"  . Breast cancer Cousin 91    reportedly negative GT is 2014-2015  . CAD Maternal Grandmother     d. 90s  . Heart attack Maternal Grandfather 51  . Heart Problems Paternal Grandmother   . Heart Problems Paternal Grandfather   . CAD Brother     triple vessel CAD, d. 22  . Other Son     enlarged prostate w/ surveillance  . Thyroid cancer Maternal Uncle 71    unspecified type  . CAD Maternal Uncle     d. 51  . Other Cousin 48    dx benign "skin polyp"  . Breast cancer Other     paternal great aunt (PGM's sister)  . Breast cancer Other     paternal great aunt (PGM's sister)    GYNECOLOGIC HISTORY:  No LMP recorded. Patient is postmenopausal. Menarche age 52, first live birth age 59. The patient is GX P4. She stopped having periods in 1989. She did not use hormone replacement. She used oral contraceptives less than one year.  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 Her husband [name] is retired from a [employer]. Son [name] lives in [place] and works as a Chief Strategy Officer. Son [name] lives in [place] and is with the [employer]. Son [name] this and cocoa [place] where he works as an Chief Financial Officer. Son [name] lives in [place] were he works as an Chief Financial Officer. The patient has 4 grandchildren including a 35-year-old they have custody of and lives with them. The patient attends our [employer]   HEALTH MAINTENANCE: Social History  Substance Use Topics  . Smoking status: Former Smoker    Years: 0.50    Types: Cigarettes    Quit date: 05/25/1974   . Smokeless tobacco: Never Used     Comment: was only a social/weekend smoker; smoked for 6 mos  . Alcohol use 0.5 oz/week    1 Standard drinks or equivalent per week     Comment: maybe 2-3 drinks per month     Colonoscopy:  PAP:  Bone density:   Allergies  Allergen Reactions  . Morphine And Related   . Vicodin [Hydrocodone-Acetaminophen]     Current Outpatient Prescriptions  Medication Sig Dispense Refill  . acetaminophen (TYLENOL) 325 MG tablet Take 2 tablets (650 mg total) by mouth every 4 (four) hours as needed for mild pain (or Fever >/= 101). (Patient not taking: Reported on 02/18/2016)    . acetaminophen-codeine (TYLENOL #3) 300-30 MG tablet Take 1-2 tablets by  mouth every 4 (four) hours as needed for moderate pain. 40 tablet 0  . aspirin 81 MG tablet Take 81 mg by mouth daily.    Marland Kitchen atorvastatin (LIPITOR) 40 MG tablet Take 40 mg by mouth daily.  5  . Cetirizine HCl 10 MG CAPS Take 10 mg by mouth.    . Cholecalciferol 1000 units capsule Take 2,000 Units by mouth daily.     Marland Kitchen etodolac (LODINE) 400 MG tablet Take 400 mg by mouth 2 (two) times daily as needed for mild pain.   2  . FIBER COMPLETE PO Take by mouth daily.      Marland Kitchen LORazepam (ATIVAN) 0.5 MG tablet Take 1 tablet (0.5 mg total) by mouth every 8 (eight) hours as needed for anxiety. 30 tablet 0  . methylPREDNISolone (MEDROL DOSEPAK) 4 MG TBPK tablet Take as directed (Patient not taking: Reported on 02/18/2016) 21 tablet 0  . metroNIDAZOLE (METROGEL) 1 % gel Apply topically daily. 45 g 0  . Multiple Vitamin (MULTIVITAMIN) tablet Take 1 tablet by mouth daily.      . nitroGLYCERIN (NITROLINGUAL) 0.4 MG/SPRAY spray     . omeprazole (PRILOSEC) 20 MG capsule Take 20 mg by mouth daily.    . ondansetron (ZOFRAN) 8 MG tablet     . Probiotic Product (ALIGN PO) Take by mouth.    . prochlorperazine (COMPAZINE) 10 MG tablet     . Vitamins C E (CRANBERRY CONCENTRATE PO) Take by mouth daily.       No current facility-administered  medications for this visit.    Facility-Administered Medications Ordered in Other Visits  Medication Dose Route Frequency Provider Last Rate Last Dose  . sodium chloride flush (NS) 0.9 % injection 10 mL  10 mL Intracatheter PRN Chauncey Cruel, MD   10 mL at 12/27/15 1630    OBJECTIVE: Middle-aged white womanWho appears stated age  Vitals:   02/18/16 1657  BP: 117/70  Pulse: 73  Resp: 18  Temp: 98 F (36.7 C)     Body mass index is 25.54 kg/m.    ECOG FS:1 - Symptomatic but completely ambulatory  Sclerae unicteric, pupils round and equal Oropharynx clear and moist-- no thrush or other lesions No cervical or supraclavicular adenopathy Lungs no rales or rhonchi Heart regular rate and rhythm Abd soft, nontender, positive bowel sounds MSK no focal spinal tenderness, no upper extremity lymphedema Neuro: nonfocal, well oriented, appropriate affect Breasts: Deferred  LAB RESULTS:  CMP     Component Value Date/Time   NA 141 02/14/2016 0801   K 4.5 02/14/2016 0801   CO2 26 02/14/2016 0801   GLUCOSE 104 02/14/2016 0801   BUN 17.1 02/14/2016 0801   CREATININE 0.7 02/14/2016 0801   CALCIUM 9.1 02/14/2016 0801   PROT 7.1 02/14/2016 0801   ALBUMIN 3.5 02/14/2016 0801   AST 20 02/14/2016 0801   ALT 21 02/14/2016 0801   ALKPHOS 80 02/14/2016 0801   BILITOT 0.31 02/14/2016 0801    INo results found for: SPEP, UPEP  Lab Results  Component Value Date   WBC 3.9 02/14/2016   NEUTROABS 2.3 02/14/2016   HGB 11.4 (L) 02/14/2016   HCT 33.7 (L) 02/14/2016   MCV 89.4 02/14/2016   PLT 230 02/14/2016      Chemistry      Component Value Date/Time   NA 141 02/14/2016 0801   K 4.5 02/14/2016 0801   CO2 26 02/14/2016 0801   BUN 17.1 02/14/2016 0801   CREATININE 0.7 02/14/2016 0801  Component Value Date/Time   CALCIUM 9.1 02/14/2016 0801   ALKPHOS 80 02/14/2016 0801   AST 20 02/14/2016 0801   ALT 21 02/14/2016 0801   BILITOT 0.31 02/14/2016 0801       No results  found for: LABCA2  No components found for: LABCA125  No results for input(s): INR in the last 168 hours.  Urinalysis No results found for: COLORURINE, APPEARANCEUR, LABSPEC, PHURINE, GLUCOSEU, HGBUR, BILIRUBINUR, KETONESUR, PROTEINUR, UROBILINOGEN, NITRITE, LEUKOCYTESUR   STUDIES: No results found.   ELIGIBLE FOR AVAILABLE RESEARCH PROTOCOL: no  ASSESSMENT: 60 y.o. [place], [place] woman status post left breast upper outer quadrant biopsy 12/02/2015 for a clinical T1c pN0, stage IA  invasive ductal carcinoma, grade 3, estrogen and progesterone receptor positive, with an MIB-1 of 15%, and HER-2 amplification  (a) biopsy of a 0.6 cm lower outer quadrant right breast mass scheduled for 12/26/2015  (1) neoadjuvant chemotherapy consisting of weekly paclitaxel 12 with trastuzumab and pertuzumab every 21 days 4 started 12/27/2015  (a) paclitaxel discontinued after 7 doses because of development of peripheral neuropathy  (b) to receive carboplatin/gemcitabine weekly 5 beginning 02/21/2016  (2) trastuzumab to be continued to total of one year  (a) echo 12/16/2015 showed an ejection fraction in the 55-60% range.  (3) definitive surgery to follow chemotherapy: considering bilateral mastectomies  (4) adjuvant radiation to follow as appropriate  (5) anti-estrogens to follow at the completion of local treatment  (a) patient is considering bilateral salpingo-oophorectomy  (6) genetics testing 02/09/2016 through the 44-gene [facility] Custom Panel performed by [facility], [place]) found no deleterious mutations in APC, ATM, AXIN2, BARD1, BMPR1A, BRCA1, BRCA2, BRIP1, CDH1, CDKN2A, CHEK2, DICER1, EPCAM, GREM1, KIT, MEN1, MLH1, MSH2, MSH6, MUTYH, NBN, NF1, NF2, PALB2, PDGFRA, PMS2, POLD1, POLE, PTEN, RAD50, RAD51C, RAD51D, SDHA, SDHB, SDHC, SDHD, SMAD4, SMARCA4, SMARE1, STK11, TP53, TSC1, TSC2, and VHL  (a)   One variant of uncertain significance (VUS) was found in "[id number]  (p.[id number])" in one copy of the DICER1 gene   PLAN: Alexandra Mathis's neuropathy is grade 1 but it appears to be persisting. We cannot proceed with taxanes in this situation.  We had the choice of discontinuing chemotherapy at this point but she has only received 7 doses of paclitaxel. This is inadequate.  Accordingly we discussed carboplatin and gemcitabine, which goes well with the trastuzumab and pertuzumab she is receiving. She has a good understanding of the possible toxicities, side effects and complications of these agents.  She is agreeable to proceeding. She will receive her first dose this Friday. She will continue to use Zofran and Compazine for nausea, but she may add dexamethasone if necessary. She is going to see me with the second of these 5 planned treatments to assess tolerance.  Once she completes her chemotherapy she will be restaged with an MRI and proceed to definitive therapy we will then drop the pertuzumab and continue trastuzumab alone for the rest of the year.  She has a good understanding of this plan. She agrees with it. She knows a goal of treatment in her case is cure. She will call with any problems that may develop before her next :Chauncey Cruel, MD   02/19/2016 5:57 PM Medical Oncology and Hematology [facility] [place], [place] [address] Tel. [phone]    Fax. [phone]

## 2016-02-20 ENCOUNTER — Other Ambulatory Visit: Payer: Self-pay | Admitting: Oncology

## 2016-02-21 ENCOUNTER — Telehealth: Payer: Self-pay | Admitting: *Deleted

## 2016-02-21 ENCOUNTER — Ambulatory Visit (HOSPITAL_BASED_OUTPATIENT_CLINIC_OR_DEPARTMENT_OTHER): Payer: BC Managed Care – PPO

## 2016-02-21 ENCOUNTER — Ambulatory Visit: Payer: BC Managed Care – PPO

## 2016-02-21 VITALS — BP 122/82 | HR 73 | Temp 98.5°F | Resp 18

## 2016-02-21 DIAGNOSIS — C50412 Malignant neoplasm of upper-outer quadrant of left female breast: Secondary | ICD-10-CM

## 2016-02-21 DIAGNOSIS — C773 Secondary and unspecified malignant neoplasm of axilla and upper limb lymph nodes: Secondary | ICD-10-CM

## 2016-02-21 DIAGNOSIS — Z5111 Encounter for antineoplastic chemotherapy: Secondary | ICD-10-CM | POA: Diagnosis not present

## 2016-02-21 DIAGNOSIS — Z95828 Presence of other vascular implants and grafts: Secondary | ICD-10-CM

## 2016-02-21 LAB — COMPREHENSIVE METABOLIC PANEL
ALBUMIN: 3.5 g/dL (ref 3.5–5.0)
ALK PHOS: 82 U/L (ref 40–150)
ALT: 21 U/L (ref 0–55)
ANION GAP: 9 meq/L (ref 3–11)
AST: 20 U/L (ref 5–34)
BILIRUBIN TOTAL: 0.41 mg/dL (ref 0.20–1.20)
BUN: 17.3 mg/dL (ref 7.0–26.0)
CALCIUM: 9.3 mg/dL (ref 8.4–10.4)
CO2: 27 mEq/L (ref 22–29)
CREATININE: 0.7 mg/dL (ref 0.6–1.1)
Chloride: 106 mEq/L (ref 98–109)
EGFR: 89 mL/min/{1.73_m2} — ABNORMAL LOW (ref >90)
Glucose: 99 mg/dl (ref 70–140)
Potassium: 4.3 mEq/L (ref 3.5–5.1)
Sodium: 143 mEq/L (ref 136–145)
Total Protein: 7.1 g/dL (ref 6.4–8.3)

## 2016-02-21 LAB — CBC WITH DIFFERENTIAL/PLATELET
BASO%: 1 % (ref 0.0–2.0)
BASOS ABS: 0 10*3/uL (ref 0.0–0.1)
EOS%: 1.8 % (ref 0.0–7.0)
Eosinophils Absolute: 0.1 10*3/uL (ref 0.0–0.5)
HEMATOCRIT: 35 % (ref 34.8–46.6)
HEMOGLOBIN: 11.9 g/dL (ref 11.6–15.9)
LYMPH#: 1.2 10*3/uL (ref 0.9–3.3)
LYMPH%: 32 % (ref 14.0–49.7)
MCH: 30.1 pg (ref 25.1–34.0)
MCHC: 34 g/dL (ref 31.5–36.0)
MCV: 88.4 fL (ref 79.5–101.0)
MONO#: 0.5 10*3/uL (ref 0.1–0.9)
MONO%: 12.1 % (ref 0.0–14.0)
NEUT#: 2.1 10*3/uL (ref 1.5–6.5)
NEUT%: 53.1 % (ref 38.4–76.8)
PLATELETS: 207 10*3/uL (ref 145–400)
RBC: 3.96 10*6/uL (ref 3.70–5.45)
RDW: 14.5 % (ref 11.2–14.5)
WBC: 3.9 10*3/uL (ref 3.9–10.3)

## 2016-02-21 MED ORDER — SODIUM CHLORIDE 0.9 % IV SOLN
10.0000 mg | Freq: Once | INTRAVENOUS | Status: AC
  Administered 2016-02-21: 10 mg via INTRAVENOUS
  Filled 2016-02-21: qty 1

## 2016-02-21 MED ORDER — SODIUM CHLORIDE 0.9 % IJ SOLN
10.0000 mL | INTRAMUSCULAR | Status: DC | PRN
  Administered 2016-02-21: 10 mL via INTRAVENOUS
  Filled 2016-02-21: qty 10

## 2016-02-21 MED ORDER — PALONOSETRON HCL INJECTION 0.25 MG/5ML
INTRAVENOUS | Status: AC
  Filled 2016-02-21: qty 5

## 2016-02-21 MED ORDER — HEPARIN SOD (PORK) LOCK FLUSH 100 UNIT/ML IV SOLN
500.0000 [IU] | Freq: Once | INTRAVENOUS | Status: AC | PRN
  Administered 2016-02-21: 500 [IU]
  Filled 2016-02-21: qty 5

## 2016-02-21 MED ORDER — SODIUM CHLORIDE 0.9% FLUSH
10.0000 mL | INTRAVENOUS | Status: DC | PRN
  Administered 2016-02-21: 10 mL
  Filled 2016-02-21: qty 10

## 2016-02-21 MED ORDER — GEMCITABINE HCL CHEMO INJECTION 1 GM/26.3ML
1000.0000 mg/m2 | Freq: Once | INTRAVENOUS | Status: AC
  Administered 2016-02-21: 1862 mg via INTRAVENOUS
  Filled 2016-02-21: qty 48.97

## 2016-02-21 MED ORDER — PALONOSETRON HCL INJECTION 0.25 MG/5ML
0.2500 mg | Freq: Once | INTRAVENOUS | Status: AC
  Administered 2016-02-21: 0.25 mg via INTRAVENOUS

## 2016-02-21 MED ORDER — SODIUM CHLORIDE 0.9 % IV SOLN
Freq: Once | INTRAVENOUS | Status: AC
  Administered 2016-02-21: 10:00:00 via INTRAVENOUS

## 2016-02-21 MED ORDER — SODIUM CHLORIDE 0.9 % IV SOLN
219.6000 mg | Freq: Once | INTRAVENOUS | Status: AC
  Administered 2016-02-21: 220 mg via INTRAVENOUS
  Filled 2016-02-21: qty 22

## 2016-02-21 NOTE — Patient Instructions (Signed)

## 2016-02-21 NOTE — Patient Instructions (Signed)
[facility] Discharge Instructions for Patients Receiving Chemotherapy  Today you received the following chemotherapy agents Gemzar/Carboplatin  To help prevent nausea and vomiting after your treatment, we encourage you to take your nausea medication as prescribed.  If you develop nausea and vomiting that is not controlled by your nausea medication, call the clinic.   BELOW ARE SYMPTOMS THAT SHOULD BE REPORTED IMMEDIATELY:  *FEVER GREATER THAN 100.5 F  *CHILLS WITH OR WITHOUT FEVER  NAUSEA AND VOMITING THAT IS NOT CONTROLLED WITH YOUR NAUSEA MEDICATION  *UNUSUAL SHORTNESS OF BREATH  *UNUSUAL BRUISING OR BLEEDING  TENDERNESS IN MOUTH AND THROAT WITH OR WITHOUT PRESENCE OF ULCERS  *URINARY PROBLEMS  *BOWEL PROBLEMS  UNUSUAL RASH Items with * indicate a potential emergency and should be followed up as soon as possible.  Feel free to call the clinic you have any questions or concerns. The clinic phone number is [phone].  Please show the [facility] at check-in to the Emergency Department and triage nurse.   Gemcitabine injection (Gemzar) What is this medicine? GEMCITABINE (jem SIT a been) is a chemotherapy drug. This medicine is used to treat many types of cancer like breast cancer, lung cancer, pancreatic cancer, and ovarian cancer. This medicine may be used for other purposes; ask your health care provider or pharmacist if you have questions. What should I tell my health care provider before I take this medicine? They need to know if you have any of these conditions: -blood disorders -infection -kidney disease -liver disease -recent or ongoing radiation therapy -an unusual or allergic reaction to gemcitabine, other chemotherapy, other medicines, foods, dyes, or preservatives -pregnant or trying to get pregnant -breast-feeding How should I use this medicine? This drug is given as an infusion into a vein. It is administered in a hospital or clinic  by a specially trained health care professional. Talk to your pediatrician regarding the use of this medicine in children. Special care may be needed. Overdosage: If you think you have taken too much of this medicine contact a poison control center or emergency room at once. NOTE: This medicine is only for you. Do not share this medicine with others. What if I miss a dose? It is important not to miss your dose. Call your doctor or health care professional if you are unable to keep an appointment. What may interact with this medicine? -medicines to increase blood counts like filgrastim, pegfilgrastim, sargramostim -some other chemotherapy drugs like cisplatin -vaccines Talk to your doctor or health care professional before taking any of these medicines: -acetaminophen -aspirin -ibuprofen -ketoprofen -naproxen This list may not describe all possible interactions. Give your health care provider a list of all the medicines, herbs, non-prescription drugs, or dietary supplements you use. Also tell them if you smoke, drink alcohol, or use illegal drugs. Some items may interact with your medicine. What should I watch for while using this medicine? Visit your doctor for checks on your progress. This drug may make you feel generally unwell. This is not uncommon, as chemotherapy can affect healthy cells as well as cancer cells. Report any side effects. Continue your course of treatment even though you feel ill unless your doctor tells you to stop. In some cases, you may be given additional medicines to help with side effects. Follow all directions for their use. Call your doctor or health care professional for advice if you get a fever, chills or sore throat, or other symptoms of a cold or flu. Do not treat  yourself. This drug decreases your body's ability to fight infections. Try to avoid being around people who are sick. This medicine may increase your risk to bruise or bleed. Call your doctor or health  care professional if you notice any unusual bleeding. Be careful brushing and flossing your teeth or using a toothpick because you may get an infection or bleed more easily. If you have any dental work done, tell your dentist you are receiving this medicine. Avoid taking products that contain aspirin, acetaminophen, ibuprofen, naproxen, or ketoprofen unless instructed by your doctor. These medicines may hide a fever. Women should inform their doctor if they wish to become pregnant or think they might be pregnant. There is a potential for serious side effects to an unborn child. Talk to your health care professional or pharmacist for more information. Do not breast-feed an infant while taking this medicine. What side effects may I notice from receiving this medicine? Side effects that you should report to your doctor or health care professional as soon as possible: -allergic reactions like skin rash, itching or hives, swelling of the face, lips, or tongue -low blood counts - this medicine may decrease the number of white blood cells, red blood cells and platelets. You may be at increased risk for infections and bleeding. -signs of infection - fever or chills, cough, sore throat, pain or difficulty passing urine -signs of decreased platelets or bleeding - bruising, pinpoint red spots on the skin, black, tarry stools, blood in the urine -signs of decreased red blood cells - unusually weak or tired, fainting spells, lightheadedness -breathing problems -chest pain -mouth sores -nausea and vomiting -pain, swelling, redness at site where injected -pain, tingling, numbness in the hands or feet -stomach pain -swelling of ankles, feet, hands -unusual bleeding Side effects that usually do not require medical attention (report to your doctor or health care professional if they continue or are bothersome): -constipation -diarrhea -hair loss -loss of appetite -stomach upset This list may not describe all  possible side effects. Call your doctor for medical advice about side effects. You may report side effects to FDA at 1-800-FDA-1088. Where should I keep my medicine? This drug is given in a hospital or clinic and will not be stored at home. NOTE: This sheet is a summary. It may not cover all possible information. If you have questions about this medicine, talk to your doctor, pharmacist, or health care provider.    2016, [employer]. (2007-09-20 18:45:54)   Carboplatin injection What is this medicine? CARBOPLATIN (KAR boe pla tin) is a chemotherapy drug. It targets fast dividing cells, like cancer cells, and causes these cells to die. This medicine is used to treat ovarian cancer and many other cancers. This medicine may be used for other purposes; ask your health care provider or pharmacist if you have questions. What should I tell my health care provider before I take this medicine? They need to know if you have any of these conditions: -blood disorders -hearing problems -kidney disease -recent or ongoing radiation therapy -an unusual or allergic reaction to carboplatin, cisplatin, other chemotherapy, other medicines, foods, dyes, or preservatives -pregnant or trying to get pregnant -breast-feeding How should I use this medicine? This drug is usually given as an infusion into a vein. It is administered in a hospital or clinic by a specially trained health care professional. Talk to your pediatrician regarding the use of this medicine in children. Special care may be needed. Overdosage: If you think you have taken too  much of this medicine contact a poison control center or emergency room at once. NOTE: This medicine is only for you. Do not share this medicine with others. What if I miss a dose? It is important not to miss a dose. Call your doctor or health care professional if you are unable to keep an appointment. What may interact with this medicine? -medicines for  seizures -medicines to increase blood counts like filgrastim, pegfilgrastim, sargramostim -some antibiotics like amikacin, gentamicin, neomycin, streptomycin, tobramycin -vaccines Talk to your doctor or health care professional before taking any of these medicines: -acetaminophen -aspirin -ibuprofen -ketoprofen -naproxen This list may not describe all possible interactions. Give your health care provider a list of all the medicines, herbs, non-prescription drugs, or dietary supplements you use. Also tell them if you smoke, drink alcohol, or use illegal drugs. Some items may interact with your medicine. What should I watch for while using this medicine? Your condition will be monitored carefully while you are receiving this medicine. You will need important blood work done while you are taking this medicine. This drug may make you feel generally unwell. This is not uncommon, as chemotherapy can affect healthy cells as well as cancer cells. Report any side effects. Continue your course of treatment even though you feel ill unless your doctor tells you to stop. In some cases, you may be given additional medicines to help with side effects. Follow all directions for their use. Call your doctor or health care professional for advice if you get a fever, chills or sore throat, or other symptoms of a cold or flu. Do not treat yourself. This drug decreases your body's ability to fight infections. Try to avoid being around people who are sick. This medicine may increase your risk to bruise or bleed. Call your doctor or health care professional if you notice any unusual bleeding. Be careful brushing and flossing your teeth or using a toothpick because you may get an infection or bleed more easily. If you have any dental work done, tell your dentist you are receiving this medicine. Avoid taking products that contain aspirin, acetaminophen, ibuprofen, naproxen, or ketoprofen unless instructed by your doctor.  These medicines may hide a fever. Do not become pregnant while taking this medicine. Women should inform their doctor if they wish to become pregnant or think they might be pregnant. There is a potential for serious side effects to an unborn child. Talk to your health care professional or pharmacist for more information. Do not breast-feed an infant while taking this medicine. What side effects may I notice from receiving this medicine? Side effects that you should report to your doctor or health care professional as soon as possible: -allergic reactions like skin rash, itching or hives, swelling of the face, lips, or tongue -signs of infection - fever or chills, cough, sore throat, pain or difficulty passing urine -signs of decreased platelets or bleeding - bruising, pinpoint red spots on the skin, black, tarry stools, nosebleeds -signs of decreased red blood cells - unusually weak or tired, fainting spells, lightheadedness -breathing problems -changes in hearing -changes in vision -chest pain -high blood pressure -low blood counts - This drug may decrease the number of white blood cells, red blood cells and platelets. You may be at increased risk for infections and bleeding. -nausea and vomiting -pain, swelling, redness or irritation at the injection site -pain, tingling, numbness in the hands or feet -problems with balance, talking, walking -trouble passing urine or change in the amount of  urine Side effects that usually do not require medical attention (report to your doctor or health care professional if they continue or are bothersome): -hair loss -loss of appetite -metallic taste in the mouth or changes in taste This list may not describe all possible side effects. Call your doctor for medical advice about side effects. You may report side effects to FDA at 1-800-FDA-1088. Where should I keep my medicine? This drug is given in a hospital or clinic and will not be stored at  home. NOTE: This sheet is a summary. It may not cover all possible information. If you have questions about this medicine, talk to your doctor, pharmacist, or health care provider.    2016, [employer]. (2007-08-16 14:38:05)

## 2016-02-21 NOTE — Telephone Encounter (Signed)
Patient called inquiring if she received her ativan during chemotherapy treatment. RN informed her that she did not receive this medication. Patient states that its usually given during the treatment. Patient has ativan at home and states that she will take 2 to help relieve the "edgy" feeling. RN informed patient that MD Magrinat will be notified, so that it can be put on her treatment plan for future visits. Patient verbalized understanding.

## 2016-02-21 NOTE — Telephone Encounter (Signed)
Left message to return call to [place] at [phone].

## 2016-02-24 ENCOUNTER — Telehealth: Payer: Self-pay | Admitting: *Deleted

## 2016-02-24 NOTE — Telephone Encounter (Signed)
This RN attempted to contact pt per chemo follow up call and obtained VM.  Message left to return to discuss how she tolerated change in regimen. This RN's name left for return call.

## 2016-02-27 NOTE — Telephone Encounter (Signed)
Spoke with patient. Patient returned call to schedule PUS. Patient scheduled for PUS 03/19/16 at 3:30pm; Dr. Sabra Heck at 4:00pm. Patient requesting late appointment due to missing many days of work already to chemo treatments.  Patient is agreeable to date and time. Order placed for PUS. No precert required per Dr. Sabra Heck.     Cc: Lerry Liner    Routing to provider for final review. Patient is agreeable to disposition. Will close encounter.

## 2016-02-28 ENCOUNTER — Other Ambulatory Visit (HOSPITAL_BASED_OUTPATIENT_CLINIC_OR_DEPARTMENT_OTHER): Payer: BC Managed Care – PPO

## 2016-02-28 ENCOUNTER — Telehealth: Payer: Self-pay

## 2016-02-28 ENCOUNTER — Other Ambulatory Visit: Payer: Self-pay | Admitting: Medical Oncology

## 2016-02-28 ENCOUNTER — Telehealth: Payer: Self-pay | Admitting: *Deleted

## 2016-02-28 ENCOUNTER — Telehealth: Payer: Self-pay | Admitting: Oncology

## 2016-02-28 ENCOUNTER — Ambulatory Visit: Payer: BC Managed Care – PPO

## 2016-02-28 ENCOUNTER — Ambulatory Visit (HOSPITAL_BASED_OUTPATIENT_CLINIC_OR_DEPARTMENT_OTHER): Payer: BC Managed Care – PPO | Admitting: Oncology

## 2016-02-28 ENCOUNTER — Ambulatory Visit (HOSPITAL_BASED_OUTPATIENT_CLINIC_OR_DEPARTMENT_OTHER): Payer: BC Managed Care – PPO

## 2016-02-28 DIAGNOSIS — Z170 Estrogen receptor positive status [ER+]: Secondary | ICD-10-CM

## 2016-02-28 DIAGNOSIS — Z5111 Encounter for antineoplastic chemotherapy: Secondary | ICD-10-CM

## 2016-02-28 DIAGNOSIS — Z95828 Presence of other vascular implants and grafts: Secondary | ICD-10-CM

## 2016-02-28 DIAGNOSIS — C773 Secondary and unspecified malignant neoplasm of axilla and upper limb lymph nodes: Secondary | ICD-10-CM

## 2016-02-28 DIAGNOSIS — C50412 Malignant neoplasm of upper-outer quadrant of left female breast: Secondary | ICD-10-CM | POA: Diagnosis not present

## 2016-02-28 DIAGNOSIS — Z5112 Encounter for antineoplastic immunotherapy: Secondary | ICD-10-CM

## 2016-02-28 LAB — CBC WITH DIFFERENTIAL/PLATELET
BASO%: 0.7 % (ref 0.0–2.0)
Basophils Absolute: 0 10*3/uL (ref 0.0–0.1)
EOS%: 1.4 % (ref 0.0–7.0)
Eosinophils Absolute: 0 10*3/uL (ref 0.0–0.5)
HEMATOCRIT: 32.7 % — AB (ref 34.8–46.6)
HEMOGLOBIN: 11.3 g/dL — AB (ref 11.6–15.9)
LYMPH#: 1.4 10*3/uL (ref 0.9–3.3)
LYMPH%: 47.6 % (ref 14.0–49.7)
MCH: 30.4 pg (ref 25.1–34.0)
MCHC: 34.6 g/dL (ref 31.5–36.0)
MCV: 87.9 fL (ref 79.5–101.0)
MONO#: 0.2 10*3/uL (ref 0.1–0.9)
MONO%: 8.3 % (ref 0.0–14.0)
NEUT%: 42 % (ref 38.4–76.8)
NEUTROS ABS: 1.2 10*3/uL — AB (ref 1.5–6.5)
Platelets: 155 10*3/uL (ref 145–400)
RBC: 3.72 10*6/uL (ref 3.70–5.45)
RDW: 13.9 % (ref 11.2–14.5)
WBC: 2.9 10*3/uL — AB (ref 3.9–10.3)

## 2016-02-28 LAB — COMPREHENSIVE METABOLIC PANEL
ALBUMIN: 3.5 g/dL (ref 3.5–5.0)
ALT: 18 U/L (ref 0–55)
AST: 21 U/L (ref 5–34)
Alkaline Phosphatase: 86 U/L (ref 40–150)
Anion Gap: 8 mEq/L (ref 3–11)
BUN: 16.4 mg/dL (ref 7.0–26.0)
CALCIUM: 9.2 mg/dL (ref 8.4–10.4)
CHLORIDE: 106 meq/L (ref 98–109)
CO2: 28 mEq/L (ref 22–29)
CREATININE: 0.7 mg/dL (ref 0.6–1.1)
EGFR: >90 mL/min/{1.73_m2} (ref >90)
Glucose: 88 mg/dl (ref 70–140)
Potassium: 4.1 mEq/L (ref 3.5–5.1)
Sodium: 142 mEq/L (ref 136–145)
Total Bilirubin: <0.3 mg/dL (ref 0.20–1.20)
Total Protein: 6.9 g/dL (ref 6.4–8.3)

## 2016-02-28 MED ORDER — SODIUM CHLORIDE 0.9 % IJ SOLN
10.0000 mL | INTRAMUSCULAR | Status: DC | PRN
  Administered 2016-02-28: 10 mL via INTRAVENOUS
  Filled 2016-02-28: qty 10

## 2016-02-28 MED ORDER — TRASTUZUMAB CHEMO 150 MG IV SOLR
6.0000 mg/kg | Freq: Once | INTRAVENOUS | Status: AC
  Administered 2016-02-28: 420 mg via INTRAVENOUS
  Filled 2016-02-28: qty 20

## 2016-02-28 MED ORDER — SODIUM CHLORIDE 0.9 % IV SOLN
420.0000 mg | Freq: Once | INTRAVENOUS | Status: AC
  Administered 2016-02-28: 420 mg via INTRAVENOUS
  Filled 2016-02-28: qty 14

## 2016-02-28 MED ORDER — SODIUM CHLORIDE 0.9 % IV SOLN
219.6000 mg | Freq: Once | INTRAVENOUS | Status: AC
  Administered 2016-02-28: 220 mg via INTRAVENOUS
  Filled 2016-02-28: qty 22

## 2016-02-28 MED ORDER — SODIUM CHLORIDE 0.9% FLUSH
10.0000 mL | INTRAVENOUS | Status: DC | PRN
  Administered 2016-02-28: 10 mL
  Filled 2016-02-28: qty 10

## 2016-02-28 MED ORDER — SODIUM CHLORIDE 0.9 % IV SOLN
10.0000 mg | Freq: Once | INTRAVENOUS | Status: AC
  Administered 2016-02-28: 10 mg via INTRAVENOUS
  Filled 2016-02-28: qty 1

## 2016-02-28 MED ORDER — LORAZEPAM 2 MG/ML IJ SOLN
INTRAMUSCULAR | Status: AC
  Filled 2016-02-28: qty 1

## 2016-02-28 MED ORDER — PALONOSETRON HCL INJECTION 0.25 MG/5ML
INTRAVENOUS | Status: AC
Start: 2016-02-28 — End: 2016-02-28
  Filled 2016-02-28: qty 5

## 2016-02-28 MED ORDER — PALONOSETRON HCL INJECTION 0.25 MG/5ML
0.2500 mg | Freq: Once | INTRAVENOUS | Status: AC
  Administered 2016-02-28: 0.25 mg via INTRAVENOUS

## 2016-02-28 MED ORDER — ACETAMINOPHEN 325 MG PO TABS
ORAL_TABLET | ORAL | Status: AC
  Filled 2016-02-28: qty 2

## 2016-02-28 MED ORDER — ACETAMINOPHEN 325 MG PO TABS
650.0000 mg | ORAL_TABLET | Freq: Once | ORAL | Status: AC
  Administered 2016-02-28: 650 mg via ORAL

## 2016-02-28 MED ORDER — LORAZEPAM 2 MG/ML IJ SOLN
0.5000 mg | Freq: Once | INTRAMUSCULAR | Status: AC
Start: 2016-02-28 — End: 2016-02-28
  Administered 2016-02-28: 0.5 mg via INTRAVENOUS

## 2016-02-28 MED ORDER — SODIUM CHLORIDE 0.9 % IV SOLN
1000.0000 mg/m2 | Freq: Once | INTRAVENOUS | Status: AC
  Administered 2016-02-28: 1862 mg via INTRAVENOUS
  Filled 2016-02-28: qty 48.97

## 2016-02-28 MED ORDER — HEPARIN SOD (PORK) LOCK FLUSH 100 UNIT/ML IV SOLN
500.0000 [IU] | Freq: Once | INTRAVENOUS | Status: AC | PRN
  Administered 2016-02-28: 500 [IU]
  Filled 2016-02-28: qty 5

## 2016-02-28 MED ORDER — SODIUM CHLORIDE 0.9 % IV SOLN
Freq: Once | INTRAVENOUS | Status: AC
Start: 2016-02-28 — End: 2016-02-28
  Administered 2016-02-28: 11:00:00 via INTRAVENOUS

## 2016-02-28 NOTE — Telephone Encounter (Signed)
Per MD ok to proceed with treatment today with ANC of 1.2.  Called to charge nurse in chemo.

## 2016-02-28 NOTE — Telephone Encounter (Signed)
Pt called stating she is missing her CBC results from today. I printed them and pt will pick up tomorrow in infusion when she receives her injection. Envelope placed in infusion.

## 2016-02-28 NOTE — Telephone Encounter (Signed)
10/07 appointment time rescheduled per patient request.

## 2016-02-28 NOTE — Patient Instructions (Signed)
[facility] Discharge Instructions for Patients Receiving Chemotherapy  Today you received the following chemotherapy agents Gemzar, Carboplatin, Perjeta and Herceptin. To help prevent nausea and vomiting after your treatment, we encourage you to take your nausea medication as prescribed.  If you develop nausea and vomiting that is not controlled by your nausea medication, call the clinic.   BELOW ARE SYMPTOMS THAT SHOULD BE REPORTED IMMEDIATELY:  *FEVER GREATER THAN 100.5 F  *CHILLS WITH OR WITHOUT FEVER  NAUSEA AND VOMITING THAT IS NOT CONTROLLED WITH YOUR NAUSEA MEDICATION  *UNUSUAL SHORTNESS OF BREATH  *UNUSUAL BRUISING OR BLEEDING  TENDERNESS IN MOUTH AND THROAT WITH OR WITHOUT PRESENCE OF ULCERS  *URINARY PROBLEMS  *BOWEL PROBLEMS  UNUSUAL RASH Items with * indicate a potential emergency and should be followed up as soon as possible.  Feel free to call the clinic you have any questions or concerns. The clinic phone number is [phone].  Please show the [facility] at check-in to the Emergency Department and triage nurse.

## 2016-02-28 NOTE — Progress Notes (Signed)
Alexandra Mathis is arrived as perhis arival she went to the tolerating she wasproposed thatshe didn't elaborate [place]  [phone] [phone]     ID: Alexandra Mathis DOB: [birth year]  MR#: [id number]  OBS#:[id number]  Patient Care Team: Alexandra Jump, PA-C as PCP - General (Family Medicine) Alexandra Skates, MD as Consulting Physician (General Surgery) Alexandra Cruel, MD as Consulting Physician (Oncology) Alexandra Rudd, MD as Consulting Physician (Radiation Oncology) Alexandra Dresser, MD as Consulting Physician (Cardiology) Alexandra Limbo, MD as Consulting Physician (Plastic Surgery) Alexandra Salon, MD as Consulting Physician (Gynecology) OTHER MD:  CHIEF COMPLAINT: Triple positive breast cancer  CURRENT TREATMENT: Carboplatin, gemcitabine, trastuzumab, and pertuzumab   BREAST CANCER HISTORY: From the original intake note:  "Alexandra Mathis" had screening mammography in June 2017 showing left breast upper outer quadrant calcifications. Accordingly she was referred for left diagnostic mammography and ultrasonography, performed 11/27/2015. The breast density was category C. Compression views of the left breast found pleomorphic calcifications in the upper outer quadrant measuring up to 4.0 cm. There was an area of associated architectural distortion in the upper outer quadrant as well. On physical exam there was an irregular thickening in the left breast at the 2:00 position anteriorly. Ultrasonography confirmed a hypoechoic irregular mass measuring 1.6 cm in the 2:00 position 2 cm from the nipple. An intramammary lymph node was noted with benign appearing. In the left axilla there was a lymph node with focally thickened cortex.  Biopsy of the left breast mass and left axillary lymph node 12/02/2015 found ([id number]), in the breast, invasive ductal carcinoma, grade 3, estrogen receptor 95% positive, progesterone receptor 15% positive, both with strong staining  intensity, with an MIB-1 of 15%, and HER-2 amplification, the signals ratio being 2.54 and the number per cell 6.10. Biopsy of the left axillary lymph node was negative.  Her subsequent history is as detailed below  INTERVAL HISTORY: Alexandra Mathis returns today for follow-up of her HER-2/neu positive breast cancer accompanied by her daughter Alexandra Mathis (who will graduate from NP program at [employer] in 2 months). Today is day 1 cycle 2 of 5 planned doses of carboplatin and gemcitabine, given weekly, with continuing trastuzumab and pertuzumab every 3 weeks. Note that we changed her chemotherapy to carboplatin and gemcitabine from taxanes because of neuropathy. She had received 7 doses of weekly paclitaxel before the switch.  REVIEW OF SYSTEMS: Alexandra Mathis's neuropathy, which affects only her toes, is no worse but also no better. She notices it particularly at night when she is in bed. This does not affect her walking or balance. She tells me she has had some black stools lately. She has not seen any bright red blood per rectum. She has not had any stomach cramps or other abdominal problems. She has not changed her diet and she is not on any iron supplements. She does have some fatigue and had more nausea with the current treatment. She did not vomit however. She only took Zofran 1 time for this. A detailed review of systems today was otherwise stable  PAST MEDICAL HISTORY: Past Medical History:  Diagnosis Date  . Breast cancer ([place])   . Breast cancer of upper-outer quadrant of left female breast ([place]) 12/05/2015  . Chest pain   . Family history of premature CAD   . Fibroid   . HLD (hyperlipidemia)     PAST SURGICAL HISTORY: Past Surgical History:  Procedure Laterality Date  . ANTERIOR CRUCIATE LIGAMENT REPAIR     right knee  .  CESAREAN SECTION     x 4, last was cesarean hysterectomy  . cesarean section/hysterectomy     done with last cesarean section  . GANGLION CYST EXCISION    . LAPAROSCOPIC  ENDOMETRIOSIS FULGURATION    . PORTACATH PLACEMENT Right 12/25/2015   Procedure: INSERTION PORT-A-CATH WITH [place];  Surgeon: Alexandra Skates, MD;  Location: [facility];  Service: General;  Laterality: Right;  . TONSILLECTOMY    . WISDOM TOOTH EXTRACTION      FAMILY HISTORY Family History  Problem Relation Age of Onset  . Other Mother     benign meningioma dx 35 s/p surgery; hx of hysterectomy for hemorrhaging  . Hypothyroidism Mother   . CAD Mother   . Colon cancer Father 78    w/ mets to lungs and liver; + chemo; d. 38  . Gout Father   . Arthritis Father   . Ovarian cancer Sister     early 37s; s/p USO  . Breast cancer Sister 35    s/p mastectomy and aromatase inhibitor  . Other Paternal Aunt 81    Primary peritoneal cancer; s/p surgery  . Cancer Paternal Uncle   . Ovarian cancer Sister 4    "pre-cancer"; s/p BSO  . Cervical cancer Sister   . Basal cell carcinoma Sister   . Colon polyps Sister     unspecified number  . Breast cancer Cousin 52    s/p mastectomy  . Ovarian cancer Cousin 39    s/p BSO  . Leukemia Cousin 62  . Cancer Cousin     dx carcinoid bowel tumor  . Other Cousin     reportedly negative "BRCA1/2 testing"  . Breast cancer Cousin 74    reportedly negative GT is 2014-2015  . CAD Maternal Grandmother     d. 90s  . Heart attack Maternal Grandfather 51  . Heart Problems Paternal Grandmother   . Heart Problems Paternal Grandfather   . CAD Brother     triple vessel CAD, d. 89  . Other Son     enlarged prostate w/ surveillance  . Thyroid cancer Maternal Uncle 71    unspecified type  . CAD Maternal Uncle     d. 51  . Other Cousin 48    dx benign "skin polyp"  . Breast cancer Other     paternal great aunt (PGM's sister)  . Breast cancer Other     paternal great aunt (PGM's sister)    GYNECOLOGIC HISTORY:  No LMP recorded. Patient is postmenopausal. Menarche age 65, first live birth age 33. The patient is GX P4. She stopped having periods in 1989. She  did not use hormone replacement. She used oral contraceptives less than one year.  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 Her husband Alexandra Mathis is retired from a [employer]. Son [name] lives in [place] and works as a Chief Strategy Officer. Son [name] lives in [place] and is with the [employer]. Son [name] this and cocoa [place] where he works as an Chief Financial Officer. Son [name] lives in [place] were he works as an Chief Financial Officer. The patient has 4 grandchildren including a 32-year-old they have custody of and lives with them. The patient attends our [employer]: In place   HEALTH MAINTENANCE: Social History  Substance Use Topics  . Smoking status: Former Smoker    Years: 0.50    Types: Cigarettes    Quit date: 05/25/1974  .  Smokeless tobacco: Never Used     Comment: was only a social/weekend smoker; smoked for 6 mos  . Alcohol use 0.5 oz/week    1 Standard drinks or equivalent per week     Comment: maybe 2-3 drinks per month     Colonoscopy:  PAP:  Bone density:   Allergies  Allergen Reactions  . Morphine And Related   . Vicodin [Hydrocodone-Acetaminophen]     Current Outpatient Prescriptions  Medication Sig Dispense Refill  . acetaminophen (TYLENOL) 325 MG tablet Take 2 tablets (650 mg total) by mouth every 4 (four) hours as needed for mild pain (or Fever >/= 101). (Patient not taking: Reported on 02/18/2016)    . acetaminophen-codeine (TYLENOL #3) 300-30 MG tablet Take 1-2 tablets by mouth every 4 (four) hours as needed for moderate pain. 40 tablet 0  . aspirin 81 MG tablet Take 81 mg by mouth daily.    Marland Kitchen atorvastatin (LIPITOR) 40 MG tablet Take 40 mg by mouth daily.  5  . Cetirizine HCl 10 MG CAPS Take 10 mg by mouth.    . Cholecalciferol 1000 units capsule Take 2,000 Units by mouth daily.     Marland Kitchen etodolac (LODINE) 400 MG tablet Take 400 mg by mouth 2 (two) times daily as needed for mild pain.   2  .  FIBER COMPLETE PO Take by mouth daily.      Marland Kitchen LORazepam (ATIVAN) 0.5 MG tablet Take 1 tablet (0.5 mg total) by mouth every 8 (eight) hours as needed for anxiety. 30 tablet 0  . methylPREDNISolone (MEDROL DOSEPAK) 4 MG TBPK tablet Take as directed (Patient not taking: Reported on 02/18/2016) 21 tablet 0  . metroNIDAZOLE (METROGEL) 1 % gel Apply topically daily. 45 g 0  . Multiple Vitamin (MULTIVITAMIN) tablet Take 1 tablet by mouth daily.      . nitroGLYCERIN (NITROLINGUAL) 0.4 MG/SPRAY spray     . omeprazole (PRILOSEC) 20 MG capsule Take 20 mg by mouth daily.    . ondansetron (ZOFRAN) 8 MG tablet     . Probiotic Product (ALIGN PO) Take by mouth.    . prochlorperazine (COMPAZINE) 10 MG tablet     . Vitamins C E (CRANBERRY CONCENTRATE PO) Take by mouth daily.       No current facility-administered medications for this visit.    Facility-Administered Medications Ordered in Other Visits  Medication Dose Route Frequency Provider Last Rate Last Dose  . sodium chloride flush (NS) 0.9 % injection 10 mL  10 mL Intracatheter PRN Alexandra Cruel, MD   10 mL at 12/27/15 1630    OBJECTIVE: Middle-aged white woman in no acute distress Vitals:   02/28/16 1009  BP: 134/80  Pulse: 82  Resp: 18  Temp: 97.9 F (36.6 C)     Body mass index is 25.26 kg/m.    ECOG FS:1 - Symptomatic but completely ambulatory  Sclerae unicteric, EOMs intact Oropharynx clear and moist No cervical or supraclavicular adenopathy Lungs no rales or rhonchi Heart regular rate and rhythm Abd soft, nontender, positive bowel sounds MSK no focal spinal tenderness, no upper extremity lymphedema Neuro: nonfocal, well oriented, appropriate affect Breasts: The right breast is unremarkable. I do not feel a mass in the left breast. There are no skin or nipple changes of concern. The left axilla is benign.  LAB RESULTS:  CMP     Component Value Date/Time   NA 142 02/28/2016 0908   K 4.1 02/28/2016 0908   CO2 28 02/28/2016  0908  GLUCOSE 88 02/28/2016 0908   BUN 16.4 02/28/2016 0908   CREATININE 0.7 02/28/2016 0908   CALCIUM 9.2 02/28/2016 0908   PROT 6.9 02/28/2016 0908   ALBUMIN 3.5 02/28/2016 0908   AST 21 02/28/2016 0908   ALT 18 02/28/2016 0908   ALKPHOS 86 02/28/2016 0908   BILITOT <0.30 02/28/2016 0908    INo results found for: SPEP, UPEP  Lab Results  Component Value Date   WBC 2.9 (L) 02/28/2016   NEUTROABS 1.2 (L) 02/28/2016   HGB 11.3 (L) 02/28/2016   HCT 32.7 (L) 02/28/2016   MCV 87.9 02/28/2016   PLT 155 02/28/2016      Chemistry      Component Value Date/Time   NA 142 02/28/2016 0908   K 4.1 02/28/2016 0908   CO2 28 02/28/2016 0908   BUN 16.4 02/28/2016 0908   CREATININE 0.7 02/28/2016 0908      Component Value Date/Time   CALCIUM 9.2 02/28/2016 0908   ALKPHOS 86 02/28/2016 0908   AST 21 02/28/2016 0908   ALT 18 02/28/2016 0908   BILITOT <0.30 02/28/2016 0908       No results found for: LABCA2  No components found for: LABCA125  No results for input(s): INR in the last 168 hours.  Urinalysis No results found for: COLORURINE, APPEARANCEUR, LABSPEC, PHURINE, GLUCOSEU, HGBUR, BILIRUBINUR, KETONESUR, PROTEINUR, UROBILINOGEN, NITRITE, LEUKOCYTESUR   STUDIES: No results found.   ELIGIBLE FOR AVAILABLE RESEARCH PROTOCOL: no  ASSESSMENT: 60 y.o. [place], [place] woman status post left breast upper outer quadrant biopsy 12/02/2015 for a clinical T1c pN0, stage IA  invasive ductal carcinoma, grade 3, estrogen and progesterone receptor positive, with an MIB-1 of 15%, and HER-2 amplification  (a) biopsy of a 0.6 cm lower outer quadrant right breast mass scheduled for 12/26/2015  (1) neoadjuvant chemotherapy consisting of weekly paclitaxel 12 with trastuzumab and pertuzumab every 21 days 4 started 12/27/2015  (a) paclitaxel discontinued after 7 doses because of development of peripheral neuropathy  (b) received carboplatin/gemcitabine weekly 5 beginning  02/21/2016  (2) trastuzumab to be continued to total of one year  (a) echo 12/16/2015 showed an ejection fraction in the 55-60% range.  (3) definitive surgery to follow chemotherapy: considering bilateral mastectomies  (4) adjuvant radiation to follow as appropriate  (5) anti-estrogens to follow at the completion of local treatment  (a) patient is considering bilateral salpingo-oophorectomy  (6) genetics testing 02/09/2016 through the 44-gene [facility] Custom Panel performed by [employer] [facility], [place]) found no deleterious mutations in APC, ATM, AXIN2, BARD1, BMPR1A, BRCA1, BRCA2, BRIP1, CDH1, CDKN2A, CHEK2, DICER1, EPCAM, GREM1, KIT, MEN1, MLH1, MSH2, MSH6, MUTYH, NBN, NF1, NF2, PALB2, PDGFRA, PMS2, POLD1, POLE, PTEN, RAD50, RAD51C, RAD51D, SDHA, SDHB, SDHC, SDHD, SMAD4, SMARCA4, SMARE1, STK11, TP53, TSC1, TSC2, and VHL  (a)   One variant of uncertain significance (VUS) was found in "c.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 However the fact that it has not so far changed her chemotherapy at that time we did  The breast exam today is entirely normal and I am hopeful that she will have a very good pathologic response at the time of surgery  I have no explanation for her black stools. Possibly there has been some bleeding. Possibly  it is dietary. There has been no significant change in her hemoglobin. She will let us know if there are further changes in her bowel habits.  Otherwise she knows to call for any problems that may develop before her next visit here, which will be next week.   :Alexandra Cruel, MD   02/29/2016 2:04 PM Medical Oncology and Hematology [facility] [place],  [place] [address] Tel. [phone]    Fax. [phone] 100 cGy.

## 2016-02-29 ENCOUNTER — Ambulatory Visit: Payer: BC Managed Care – PPO

## 2016-02-29 ENCOUNTER — Ambulatory Visit (HOSPITAL_BASED_OUTPATIENT_CLINIC_OR_DEPARTMENT_OTHER): Payer: BC Managed Care – PPO

## 2016-02-29 DIAGNOSIS — C50412 Malignant neoplasm of upper-outer quadrant of left female breast: Secondary | ICD-10-CM | POA: Diagnosis not present

## 2016-02-29 DIAGNOSIS — Z170 Estrogen receptor positive status [ER+]: Secondary | ICD-10-CM

## 2016-02-29 DIAGNOSIS — D701 Agranulocytosis secondary to cancer chemotherapy: Secondary | ICD-10-CM | POA: Diagnosis not present

## 2016-02-29 DIAGNOSIS — Z95828 Presence of other vascular implants and grafts: Secondary | ICD-10-CM

## 2016-02-29 MED ORDER — FILGRASTIM 480 MCG/1.6ML IJ SOLN
300.0000 ug | Freq: Once | INTRAMUSCULAR | Status: AC
  Administered 2016-02-29: 300 ug via SUBCUTANEOUS

## 2016-02-29 NOTE — Patient Instructions (Signed)
Pegfilgrastim injection What is this medicine? PEGFILGRASTIM (PEG fil gra stim) is a long-acting granulocyte colony-stimulating factor that stimulates the growth of neutrophils, a type of white blood cell important in the body's fight against infection. It is used to reduce the incidence of fever and infection in patients with certain types of cancer who are receiving chemotherapy that affects the bone marrow, and to increase survival after being exposed to high doses of radiation. This medicine may be used for other purposes; ask your health care provider or pharmacist if you have questions. What should I tell my health care provider before I take this medicine? They need to know if you have any of these conditions: -kidney disease -latex allergy -ongoing radiation therapy -sickle cell disease -skin reactions to acrylic adhesives (On-Body Injector only) -an unusual or allergic reaction to pegfilgrastim, filgrastim, other medicines, foods, dyes, or preservatives -pregnant or trying to get pregnant -breast-feeding How should I use this medicine? This medicine is for injection under the skin. If you get this medicine at home, you will be taught how to prepare and give the pre-filled syringe or how to use the On-body Injector. Refer to the patient Instructions for Use for detailed instructions. Use exactly as directed. Take your medicine at regular intervals. Do not take your medicine more often than directed. It is important that you put your used needles and syringes in a special sharps container. Do not put them in a trash can. If you do not have a sharps container, call your pharmacist or healthcare provider to get one. Talk to your pediatrician regarding the use of this medicine in children. While this drug may be prescribed for selected conditions, precautions do apply. Overdosage: If you think you have taken too much of this medicine contact a poison control center or emergency room at  once. NOTE: This medicine is only for you. Do not share this medicine with others. What if I miss a dose? It is important not to miss your dose. Call your doctor or health care professional if you miss your dose. If you miss a dose due to an On-body Injector failure or leakage, a new dose should be administered as soon as possible using a single prefilled syringe for manual use. What may interact with this medicine? Interactions have not been studied. Give your health care provider a list of all the medicines, herbs, non-prescription drugs, or dietary supplements you use. Also tell them if you smoke, drink alcohol, or use illegal drugs. Some items may interact with your medicine. This list may not describe all possible interactions. Give your health care provider a list of all the medicines, herbs, non-prescription drugs, or dietary supplements you use. Also tell them if you smoke, drink alcohol, or use illegal drugs. Some items may interact with your medicine. What should I watch for while using this medicine? You may need blood work done while you are taking this medicine. If you are going to need a MRI, CT scan, or other procedure, tell your doctor that you are using this medicine (On-Body Injector only). What side effects may I notice from receiving this medicine? Side effects that you should report to your doctor or health care professional as soon as possible: -allergic reactions like skin rash, itching or hives, swelling of the face, lips, or tongue -dizziness -fever -pain, redness, or irritation at site where injected -pinpoint red spots on the skin -red or dark-brown urine -shortness of breath or breathing problems -stomach or side pain, or pain   at the shoulder -swelling -tiredness -trouble passing urine or change in the amount of urine Side effects that usually do not require medical attention (report to your doctor or health care professional if they continue or are  bothersome): -bone pain -muscle pain This list may not describe all possible side effects. Call your doctor for medical advice about side effects. You may report side effects to FDA at 1-800-FDA-1088. Where should I keep my medicine? Keep out of the reach of children. Store pre-filled syringes in a refrigerator between 2 and 8 degrees C (36 and 46 degrees F). Do not freeze. Keep in carton to protect from light. Throw away this medicine if it is left out of the refrigerator for more than 48 hours. Throw away any unused medicine after the expiration date. NOTE: This sheet is a summary. It may not cover all possible information. If you have questions about this medicine, talk to your doctor, pharmacist, or health care provider.    2016, Elsevier/Gold Standard. (2014-05-31 14:30:14)  

## 2016-03-02 ENCOUNTER — Ambulatory Visit (HOSPITAL_BASED_OUTPATIENT_CLINIC_OR_DEPARTMENT_OTHER): Payer: BC Managed Care – PPO

## 2016-03-02 VITALS — BP 115/61 | HR 100 | Temp 98.5°F | Resp 20

## 2016-03-02 DIAGNOSIS — C50412 Malignant neoplasm of upper-outer quadrant of left female breast: Secondary | ICD-10-CM

## 2016-03-02 DIAGNOSIS — Z170 Estrogen receptor positive status [ER+]: Secondary | ICD-10-CM

## 2016-03-02 DIAGNOSIS — Z95828 Presence of other vascular implants and grafts: Secondary | ICD-10-CM

## 2016-03-02 DIAGNOSIS — D701 Agranulocytosis secondary to cancer chemotherapy: Secondary | ICD-10-CM

## 2016-03-02 MED ORDER — FILGRASTIM 480 MCG/1.6ML IJ SOLN: 300.0000 ug | Freq: Once | INTRAMUSCULAR | Status: DC

## 2016-03-02 MED ORDER — TBO-FILGRASTIM 300 MCG/0.5ML ~~LOC~~ SOSY
300.0000 ug | PREFILLED_SYRINGE | Freq: Once | SUBCUTANEOUS | Status: AC
  Administered 2016-03-02: 300 ug via SUBCUTANEOUS
  Filled 2016-03-02: qty 0.5

## 2016-03-02 MED ORDER — ALTEPLASE 2 MG IJ SOLR
2.0000 mg | Freq: Once | INTRAMUSCULAR | Status: DC | PRN
  Filled 2016-03-02: qty 2

## 2016-03-02 NOTE — Patient Instructions (Signed)
Pegfilgrastim injection What is this medicine? PEGFILGRASTIM (PEG fil gra stim) is a long-acting granulocyte colony-stimulating factor that stimulates the growth of neutrophils, a type of white blood cell important in the body's fight against infection. It is used to reduce the incidence of fever and infection in patients with certain types of cancer who are receiving chemotherapy that affects the bone marrow, and to increase survival after being exposed to high doses of radiation. This medicine may be used for other purposes; ask your health care provider or pharmacist if you have questions. What should I tell my health care provider before I take this medicine? They need to know if you have any of these conditions: -kidney disease -latex allergy -ongoing radiation therapy -sickle cell disease -skin reactions to acrylic adhesives (On-Body Injector only) -an unusual or allergic reaction to pegfilgrastim, filgrastim, other medicines, foods, dyes, or preservatives -pregnant or trying to get pregnant -breast-feeding How should I use this medicine? This medicine is for injection under the skin. If you get this medicine at home, you will be taught how to prepare and give the pre-filled syringe or how to use the On-body Injector. Refer to the patient Instructions for Use for detailed instructions. Use exactly as directed. Take your medicine at regular intervals. Do not take your medicine more often than directed. It is important that you put your used needles and syringes in a special sharps container. Do not put them in a trash can. If you do not have a sharps container, call your pharmacist or healthcare provider to get one. Talk to your pediatrician regarding the use of this medicine in children. While this drug may be prescribed for selected conditions, precautions do apply. Overdosage: If you think you have taken too much of this medicine contact a poison control center or emergency room at  once. NOTE: This medicine is only for you. Do not share this medicine with others. What if I miss a dose? It is important not to miss your dose. Call your doctor or health care professional if you miss your dose. If you miss a dose due to an On-body Injector failure or leakage, a new dose should be administered as soon as possible using a single prefilled syringe for manual use. What may interact with this medicine? Interactions have not been studied. Give your health care provider a list of all the medicines, herbs, non-prescription drugs, or dietary supplements you use. Also tell them if you smoke, drink alcohol, or use illegal drugs. Some items may interact with your medicine. This list may not describe all possible interactions. Give your health care provider a list of all the medicines, herbs, non-prescription drugs, or dietary supplements you use. Also tell them if you smoke, drink alcohol, or use illegal drugs. Some items may interact with your medicine. What should I watch for while using this medicine? You may need blood work done while you are taking this medicine. If you are going to need a MRI, CT scan, or other procedure, tell your doctor that you are using this medicine (On-Body Injector only). What side effects may I notice from receiving this medicine? Side effects that you should report to your doctor or health care professional as soon as possible: -allergic reactions like skin rash, itching or hives, swelling of the face, lips, or tongue -dizziness -fever -pain, redness, or irritation at site where injected -pinpoint red spots on the skin -red or dark-brown urine -shortness of breath or breathing problems -stomach or side pain, or pain   at the shoulder -swelling -tiredness -trouble passing urine or change in the amount of urine Side effects that usually do not require medical attention (report to your doctor or health care professional if they continue or are  bothersome): -bone pain -muscle pain This list may not describe all possible side effects. Call your doctor for medical advice about side effects. You may report side effects to FDA at 1-800-FDA-1088. Where should I keep my medicine? Keep out of the reach of children. Store pre-filled syringes in a refrigerator between 2 and 8 degrees C (36 and 46 degrees F). Do not freeze. Keep in carton to protect from light. Throw away this medicine if it is left out of the refrigerator for more than 48 hours. Throw away any unused medicine after the expiration date. NOTE: This sheet is a summary. It may not cover all possible information. If you have questions about this medicine, talk to your doctor, pharmacist, or health care provider.    2016, Elsevier/Gold Standard. (2014-05-31 14:30:14)  

## 2016-03-03 ENCOUNTER — Ambulatory Visit (HOSPITAL_BASED_OUTPATIENT_CLINIC_OR_DEPARTMENT_OTHER): Payer: BC Managed Care – PPO

## 2016-03-03 VITALS — BP 122/69 | HR 96 | Temp 98.4°F | Resp 18

## 2016-03-03 DIAGNOSIS — Z95828 Presence of other vascular implants and grafts: Secondary | ICD-10-CM

## 2016-03-03 DIAGNOSIS — C50412 Malignant neoplasm of upper-outer quadrant of left female breast: Secondary | ICD-10-CM

## 2016-03-03 DIAGNOSIS — Z5189 Encounter for other specified aftercare: Secondary | ICD-10-CM

## 2016-03-03 DIAGNOSIS — Z170 Estrogen receptor positive status [ER+]: Secondary | ICD-10-CM

## 2016-03-03 MED ORDER — TBO-FILGRASTIM 300 MCG/0.5ML ~~LOC~~ SOSY
300.0000 ug | PREFILLED_SYRINGE | Freq: Once | SUBCUTANEOUS | Status: AC
  Administered 2016-03-03: 300 ug via SUBCUTANEOUS
  Filled 2016-03-03: qty 0.5

## 2016-03-03 NOTE — Patient Instructions (Signed)
Pegfilgrastim injection What is this medicine? PEGFILGRASTIM (PEG fil gra stim) is a long-acting granulocyte colony-stimulating factor that stimulates the growth of neutrophils, a type of white blood cell important in the body's fight against infection. It is used to reduce the incidence of fever and infection in patients with certain types of cancer who are receiving chemotherapy that affects the bone marrow, and to increase survival after being exposed to high doses of radiation. This medicine may be used for other purposes; ask your health care provider or pharmacist if you have questions. What should I tell my health care provider before I take this medicine? They need to know if you have any of these conditions: -kidney disease -latex allergy -ongoing radiation therapy -sickle cell disease -skin reactions to acrylic adhesives (On-Body Injector only) -an unusual or allergic reaction to pegfilgrastim, filgrastim, other medicines, foods, dyes, or preservatives -pregnant or trying to get pregnant -breast-feeding How should I use this medicine? This medicine is for injection under the skin. If you get this medicine at home, you will be taught how to prepare and give the pre-filled syringe or how to use the On-body Injector. Refer to the patient Instructions for Use for detailed instructions. Use exactly as directed. Take your medicine at regular intervals. Do not take your medicine more often than directed. It is important that you put your used needles and syringes in a special sharps container. Do not put them in a trash can. If you do not have a sharps container, call your pharmacist or healthcare provider to get one. Talk to your pediatrician regarding the use of this medicine in children. While this drug may be prescribed for selected conditions, precautions do apply. Overdosage: If you think you have taken too much of this medicine contact a poison control center or emergency room at  once. NOTE: This medicine is only for you. Do not share this medicine with others. What if I miss a dose? It is important not to miss your dose. Call your doctor or health care professional if you miss your dose. If you miss a dose due to an On-body Injector failure or leakage, a new dose should be administered as soon as possible using a single prefilled syringe for manual use. What may interact with this medicine? Interactions have not been studied. Give your health care provider a list of all the medicines, herbs, non-prescription drugs, or dietary supplements you use. Also tell them if you smoke, drink alcohol, or use illegal drugs. Some items may interact with your medicine. This list may not describe all possible interactions. Give your health care provider a list of all the medicines, herbs, non-prescription drugs, or dietary supplements you use. Also tell them if you smoke, drink alcohol, or use illegal drugs. Some items may interact with your medicine. What should I watch for while using this medicine? You may need blood work done while you are taking this medicine. If you are going to need a MRI, CT scan, or other procedure, tell your doctor that you are using this medicine (On-Body Injector only). What side effects may I notice from receiving this medicine? Side effects that you should report to your doctor or health care professional as soon as possible: -allergic reactions like skin rash, itching or hives, swelling of the face, lips, or tongue -dizziness -fever -pain, redness, or irritation at site where injected -pinpoint red spots on the skin -red or dark-brown urine -shortness of breath or breathing problems -stomach or side pain, or pain   at the shoulder -swelling -tiredness -trouble passing urine or change in the amount of urine Side effects that usually do not require medical attention (report to your doctor or health care professional if they continue or are  bothersome): -bone pain -muscle pain This list may not describe all possible side effects. Call your doctor for medical advice about side effects. You may report side effects to FDA at 1-800-FDA-1088. Where should I keep my medicine? Keep out of the reach of children. Store pre-filled syringes in a refrigerator between 2 and 8 degrees C (36 and 46 degrees F). Do not freeze. Keep in carton to protect from light. Throw away this medicine if it is left out of the refrigerator for more than 48 hours. Throw away any unused medicine after the expiration date. NOTE: This sheet is a summary. It may not cover all possible information. If you have questions about this medicine, talk to your doctor, pharmacist, or health care provider.    2016, Elsevier/Gold Standard. (2014-05-31 14:30:14)  

## 2016-03-06 ENCOUNTER — Other Ambulatory Visit: Payer: Self-pay | Admitting: *Deleted

## 2016-03-06 ENCOUNTER — Ambulatory Visit: Payer: BC Managed Care – PPO

## 2016-03-06 ENCOUNTER — Other Ambulatory Visit (HOSPITAL_BASED_OUTPATIENT_CLINIC_OR_DEPARTMENT_OTHER): Payer: BC Managed Care – PPO

## 2016-03-06 ENCOUNTER — Other Ambulatory Visit: Payer: Self-pay | Admitting: Medical Oncology

## 2016-03-06 ENCOUNTER — Ambulatory Visit (HOSPITAL_BASED_OUTPATIENT_CLINIC_OR_DEPARTMENT_OTHER): Payer: BC Managed Care – PPO | Admitting: Oncology

## 2016-03-06 ENCOUNTER — Ambulatory Visit (HOSPITAL_BASED_OUTPATIENT_CLINIC_OR_DEPARTMENT_OTHER): Payer: BC Managed Care – PPO

## 2016-03-06 VITALS — BP 113/68 | HR 79 | Temp 98.2°F | Resp 19

## 2016-03-06 DIAGNOSIS — C50412 Malignant neoplasm of upper-outer quadrant of left female breast: Secondary | ICD-10-CM | POA: Diagnosis not present

## 2016-03-06 DIAGNOSIS — R197 Diarrhea, unspecified: Secondary | ICD-10-CM

## 2016-03-06 DIAGNOSIS — Z170 Estrogen receptor positive status [ER+]: Secondary | ICD-10-CM | POA: Diagnosis not present

## 2016-03-06 DIAGNOSIS — E860 Dehydration: Secondary | ICD-10-CM

## 2016-03-06 DIAGNOSIS — Z95828 Presence of other vascular implants and grafts: Secondary | ICD-10-CM

## 2016-03-06 LAB — CBC WITH DIFFERENTIAL/PLATELET
BASO%: 0.1 % (ref 0.0–2.0)
BASOS ABS: 0 10*3/uL (ref 0.0–0.1)
EOS ABS: 0 10*3/uL (ref 0.0–0.5)
EOS%: 0.1 % (ref 0.0–7.0)
HCT: 30.8 % — ABNORMAL LOW (ref 34.8–46.6)
HGB: 10.6 g/dL — ABNORMAL LOW (ref 11.6–15.9)
LYMPH%: 11.6 % — AB (ref 14.0–49.7)
MCH: 30.4 pg (ref 25.1–34.0)
MCHC: 34.4 g/dL (ref 31.5–36.0)
MCV: 88.3 fL (ref 79.5–101.0)
MONO#: 1.3 10*3/uL — ABNORMAL HIGH (ref 0.1–0.9)
MONO%: 8.4 % (ref 0.0–14.0)
NEUT#: 12.7 10*3/uL — ABNORMAL HIGH (ref 1.5–6.5)
NEUT%: 79.8 % — AB (ref 38.4–76.8)
Platelets: 58 10*3/uL — ABNORMAL LOW (ref 145–400)
RBC: 3.49 10*6/uL — AB (ref 3.70–5.45)
RDW: 14.5 % (ref 11.2–14.5)
WBC: 15.9 10*3/uL — ABNORMAL HIGH (ref 3.9–10.3)
lymph#: 1.8 10*3/uL (ref 0.9–3.3)

## 2016-03-06 LAB — COMPREHENSIVE METABOLIC PANEL
ALK PHOS: 155 U/L — AB (ref 40–150)
ALT: 13 U/L (ref 0–55)
AST: 15 U/L (ref 5–34)
Albumin: 3.5 g/dL (ref 3.5–5.0)
Anion Gap: 7 mEq/L (ref 3–11)
BUN: 13.4 mg/dL (ref 7.0–26.0)
CO2: 26 meq/L (ref 22–29)
Calcium: 8.7 mg/dL (ref 8.4–10.4)
Chloride: 107 mEq/L (ref 98–109)
Creatinine: 0.7 mg/dL (ref 0.6–1.1)
EGFR: 89 mL/min/{1.73_m2} — AB (ref >90)
GLUCOSE: 105 mg/dL (ref 70–140)
POTASSIUM: 4.1 meq/L (ref 3.5–5.1)
SODIUM: 140 meq/L (ref 136–145)
Total Bilirubin: 0.23 mg/dL (ref 0.20–1.20)
Total Protein: 6.6 g/dL (ref 6.4–8.3)

## 2016-03-06 MED ORDER — SODIUM CHLORIDE 0.9 % IV SOLN: 10.0000 mg | Freq: Once | INTRAVENOUS | Status: DC

## 2016-03-06 MED ORDER — SODIUM CHLORIDE 0.9 % IV SOLN: Freq: Once | INTRAVENOUS | Status: DC

## 2016-03-06 MED ORDER — SODIUM CHLORIDE 0.9% FLUSH
10.0000 mL | INTRAVENOUS | Status: DC | PRN
  Administered 2016-03-06: 10 mL
  Filled 2016-03-06: qty 10

## 2016-03-06 MED ORDER — SODIUM CHLORIDE 0.9 % IV SOLN: 219.6000 mg | Freq: Once | INTRAVENOUS | Status: DC

## 2016-03-06 MED ORDER — SODIUM CHLORIDE 0.9 % IJ SOLN
10.0000 mL | INTRAMUSCULAR | Status: AC | PRN
  Administered 2016-03-06: 10 mL
  Filled 2016-03-06: qty 10

## 2016-03-06 MED ORDER — PALONOSETRON HCL INJECTION 0.25 MG/5ML: 0.2500 mg | Freq: Once | INTRAVENOUS | Status: DC

## 2016-03-06 MED ORDER — SODIUM CHLORIDE 0.9 % IV SOLN
Freq: Once | INTRAVENOUS | Status: AC
  Administered 2016-03-06: 10:00:00 via INTRAVENOUS

## 2016-03-06 MED ORDER — HEPARIN SOD (PORK) LOCK FLUSH 100 UNIT/ML IV SOLN
500.0000 [IU] | Freq: Once | INTRAVENOUS | Status: AC | PRN
  Administered 2016-03-06: 500 [IU]
  Filled 2016-03-06: qty 5

## 2016-03-06 MED ORDER — SODIUM CHLORIDE 0.9 % IV SOLN: 1000.0000 mg/m2 | Freq: Once | INTRAVENOUS | Status: DC

## 2016-03-06 MED ORDER — LORAZEPAM 2 MG/ML IJ SOLN: 0.5000 mg | Freq: Once | INTRAMUSCULAR | Status: DC

## 2016-03-06 NOTE — Progress Notes (Signed)
Trenda Moots is arrived as perhis arival she went to the tolerating she wasproposed thatshe didn't elaborate [employer]  [phone] [phone]     ID: KELSI BENHAM DOB: 12-18-1955  MR#: [id number]  EUM#:[id number]  Patient Care Team: Darrol Jump, PA-C as PCP - General (Family Medicine) Fanny Skates, MD as Consulting Physician (General Surgery) Chauncey Cruel, MD as Consulting Physician (Oncology) Kyung Rudd, MD as Consulting Physician (Radiation Oncology) Larey Dresser, MD as Consulting Physician (Cardiology) Irene Limbo, MD as Consulting Physician (Plastic Surgery) Megan Salon, MD as Consulting Physician (Gynecology) OTHER MD:  CHIEF COMPLAINT: Triple positive breast cancer  CURRENT TREATMENT: Carboplatin, gemcitabine, trastuzumab, and pertuzumab   BREAST CANCER HISTORY: From the original intake note:  "Lesleigh Noe" had screening mammography in June 2017 showing left breast upper outer quadrant calcifications. Accordingly she was referred for left diagnostic mammography and ultrasonography, performed 11/27/2015. The breast density was category C. Compression views of the left breast found pleomorphic calcifications in the upper outer quadrant measuring up to 4.0 cm. There was an area of associated architectural distortion in the upper outer quadrant as well. On physical exam there was an irregular thickening in the left breast at the 2:00 position anteriorly. Ultrasonography confirmed a hypoechoic irregular mass measuring 1.6 cm in the 2:00 position 2 cm from the nipple. An intramammary lymph node was noted with benign appearing. In the left axilla there was a lymph node with focally thickened cortex.  Biopsy of the left breast mass and left axillary lymph node 12/02/2015 found ([id number]), in the breast, invasive ductal carcinoma, grade 3, estrogen receptor 95% positive, progesterone receptor 15% positive, both with strong staining  intensity, with an MIB-1 of 15%, and HER-2 amplification, the signals ratio being 2.54 and the number per cell 6.10. Biopsy of the left axillary lymph node was negative.  Her subsequent history is as detailed below  INTERVAL HISTORY: Lesleigh Noe returns today for follow-up of her estrogen receptor positive breast cancer accompanied by her husband. Today is day 1 cycle 3 of 5 planned doses of carboplatin and gemcitabine, given weekly, with continuing trastuzumab and pertuzumab every 3 weeks.   REVIEW OF SYSTEMS: Annabell Howells has had explosive diarrhea for the past 3 days (beginning 03/03/2016. She has been using Imodium sporadically. She has been able to continue to work but has felt very "waist". Recall that she did have pertuzumab last week. However she has not had diarrhea from pertuzumab previously. She has not had antibiotics recently. Her husband however did have diarrhea briefly about a week ago. Aside from that, and the accompanying fatigue, she has had mild nausea. A detailed review of systems today was otherwise noncontributory  PAST MEDICAL HISTORY: Past Medical History:  Diagnosis Date  . Breast cancer ([place])   . Breast cancer of upper-outer quadrant of left female breast ([place]) 12/05/2015  . Chest pain   . Family history of premature CAD   . Fibroid   . HLD (hyperlipidemia)     PAST SURGICAL HISTORY: Past Surgical History:  Procedure Laterality Date  . ANTERIOR CRUCIATE LIGAMENT REPAIR     right knee  . CESAREAN SECTION     x 4, last was cesarean hysterectomy  . cesarean section/hysterectomy     done with last cesarean section  . GANGLION CYST EXCISION    . LAPAROSCOPIC ENDOMETRIOSIS FULGURATION    . PORTACATH PLACEMENT Right 12/25/2015   Procedure: INSERTION PORT-A-CATH WITH [place];  Surgeon: Fanny Skates, MD;  Location: [facility];  Service: General;  Laterality: Right;  . TONSILLECTOMY    . WISDOM TOOTH EXTRACTION      FAMILY HISTORY Family History  Problem Relation Age of Onset  .  Other Mother     benign meningioma dx 17 s/p surgery; hx of hysterectomy for hemorrhaging  . Hypothyroidism Mother   . CAD Mother   . Colon cancer Father 19    w/ mets to lungs and liver; + chemo; d. 43  . Gout Father   . Arthritis Father   . Ovarian cancer Sister     early 32s; s/p USO  . Breast cancer Sister 64    s/p mastectomy and aromatase inhibitor  . Other Paternal Aunt 30    Primary peritoneal cancer; s/p surgery  . Cancer Paternal Uncle   . Ovarian cancer Sister 52    "pre-cancer"; s/p BSO  . Cervical cancer Sister   . Basal cell carcinoma Sister   . Colon polyps Sister     unspecified number  . Breast cancer Cousin 46    s/p mastectomy  . Ovarian cancer Cousin 69    s/p BSO  . Leukemia Cousin 67  . Cancer Cousin     dx carcinoid bowel tumor  . Other Cousin     reportedly negative "BRCA1/2 testing"  . Breast cancer Cousin 68    reportedly negative GT is 2014-2015  . CAD Maternal Grandmother     d. 90s  . Heart attack Maternal Grandfather 51  . Heart Problems Paternal Grandmother   . Heart Problems Paternal Grandfather   . CAD Brother     triple vessel CAD, d. 53  . Other Son     enlarged prostate w/ surveillance  . Thyroid cancer Maternal Uncle 71    unspecified type  . CAD Maternal Uncle     d. 75  . Other Cousin 48    dx benign "skin polyp"  . Breast cancer Other     paternal great aunt (PGM's sister)  . Breast cancer Other     paternal great aunt (PGM's sister)    GYNECOLOGIC HISTORY:  No LMP recorded. Patient is postmenopausal. Menarche age 60, first live birth age 60. The patient is GX P4. She stopped having periods in 1989. She did not use hormone replacement. She used oral contraceptives less than one year.  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 Her husband [name] is retired from a [employer]. Son [name] lives in [place] and works as a Chief Strategy Officer. Son [name] lives in [place] and is with  the [employer]. Son [name] this and [place] where he works as an Chief Financial Officer. Son [name] lives in [place] were he works as an Chief Financial Officer. The patient has 4 grandchildren including a 21-year-old they have custody of and lives with them. The patient attends our [employer]   HEALTH MAINTENANCE: Social History  Substance Use Topics  . Smoking status: Former Smoker    Years: 0.50    Types: Cigarettes    Quit date: 05/25/1974  . Smokeless tobacco: Never Used     Comment: was only a social/weekend smoker; smoked for 6 mos  . Alcohol use 0.5 oz/week    1 Standard drinks or equivalent per week     Comment: maybe 2-3 drinks per month     Colonoscopy:  PAP:  Bone density:   Allergies  Allergen Reactions  .  Morphine And Related   . Vicodin [Hydrocodone-Acetaminophen]     Current Outpatient Prescriptions  Medication Sig Dispense Refill  . acetaminophen (TYLENOL) 325 MG tablet Take 2 tablets (650 mg total) by mouth every 4 (four) hours as needed for mild pain (or Fever >/= 101). (Patient not taking: Reported on 02/18/2016)    . acetaminophen-codeine (TYLENOL #3) 300-30 MG tablet Take 1-2 tablets by mouth every 4 (four) hours as needed for moderate pain. 40 tablet 0  . aspirin 81 MG tablet Take 81 mg by mouth daily.    Marland Kitchen atorvastatin (LIPITOR) 40 MG tablet Take 40 mg by mouth daily.  5  . Cetirizine HCl 10 MG CAPS Take 10 mg by mouth.    . Cholecalciferol 1000 units capsule Take 2,000 Units by mouth daily.     Marland Kitchen etodolac (LODINE) 400 MG tablet Take 400 mg by mouth 2 (two) times daily as needed for mild pain.   2  . FIBER COMPLETE PO Take by mouth daily.      Marland Kitchen LORazepam (ATIVAN) 0.5 MG tablet Take 1 tablet (0.5 mg total) by mouth every 8 (eight) hours as needed for anxiety. 30 tablet 0  . methylPREDNISolone (MEDROL DOSEPAK) 4 MG TBPK tablet Take as directed (Patient not taking: Reported on 02/18/2016) 21 tablet 0  . metroNIDAZOLE  (METROGEL) 1 % gel Apply topically daily. 45 g 0  . Multiple Vitamin (MULTIVITAMIN) tablet Take 1 tablet by mouth daily.      . nitroGLYCERIN (NITROLINGUAL) 0.4 MG/SPRAY spray     . omeprazole (PRILOSEC) 20 MG capsule Take 20 mg by mouth daily.    . ondansetron (ZOFRAN) 8 MG tablet     . Probiotic Product (ALIGN PO) Take by mouth.    . prochlorperazine (COMPAZINE) 10 MG tablet     . Vitamins C E (CRANBERRY CONCENTRATE PO) Take by mouth daily.       No current facility-administered medications for this visit.    Facility-Administered Medications Ordered in Other Visits  Medication Dose Route Frequency Provider Last Rate Last Dose  . heparin lock flush 100 unit/mL  500 Units Intracatheter Once PRN Chauncey Cruel, MD      . sodium chloride flush (NS) 0.9 % injection 10 mL  10 mL Intracatheter PRN Chauncey Cruel, MD   10 mL at 12/27/15 1630  . sodium chloride flush (NS) 0.9 % injection 10 mL  10 mL Intracatheter PRN Chauncey Cruel, MD        OBJECTIVE: Middle-aged white woman There were no vitals filed for this visit.   There is no height or weight on file to calculate BMI.    ECOG FS:1 - Symptomatic but completely ambulatory  Blood pressure is 113/68, pulse rate is 79, respiratory rate 19, and temperature 98.2.  Sclerae unicteric, pupils round and equal Oropharynx clear and moist-- no thrush or other lesions No cervical or supraclavicular adenopathy Lungs no rales or rhonchi Heart regular rate and rhythm Abd soft, nontender, positive bowel sounds MSK no focal spinal tenderness, no upper extremity lymphedema Neuro: nonfocal, well oriented, appropriate affect Breasts: Deferred   LAB RESULTS:  CMP     Component Value Date/Time   NA 140 03/06/2016 0820   K 4.1 03/06/2016 0820   CO2 26 03/06/2016 0820   GLUCOSE 105 03/06/2016 0820   BUN 13.4 03/06/2016 0820   CREATININE 0.7 03/06/2016 0820   CALCIUM 8.7 03/06/2016 0820   PROT 6.6 03/06/2016 0820   ALBUMIN 3.5 03/06/2016  0820  AST 15 03/06/2016 0820   ALT 13 03/06/2016 0820   ALKPHOS 155 (H) 03/06/2016 0820   BILITOT 0.23 03/06/2016 0820    INo results found for: SPEP, UPEP  Lab Results  Component Value Date   WBC 15.9 (H) 03/06/2016   NEUTROABS 12.7 (H) 03/06/2016   HGB 10.6 (L) 03/06/2016   HCT 30.8 (L) 03/06/2016   MCV 88.3 03/06/2016   PLT 58 (L) 03/06/2016      Chemistry      Component Value Date/Time   NA 140 03/06/2016 0820   K 4.1 03/06/2016 0820   CO2 26 03/06/2016 0820   BUN 13.4 03/06/2016 0820   CREATININE 0.7 03/06/2016 0820      Component Value Date/Time   CALCIUM 8.7 03/06/2016 0820   ALKPHOS 155 (H) 03/06/2016 0820   AST 15 03/06/2016 0820   ALT 13 03/06/2016 0820   BILITOT 0.23 03/06/2016 0820       No results found for: LABCA2  No components found for: LABCA125  No results for input(s): INR in the last 168 hours.  Urinalysis No results found for: COLORURINE, APPEARANCEUR, LABSPEC, PHURINE, GLUCOSEU, HGBUR, BILIRUBINUR, KETONESUR, PROTEINUR, UROBILINOGEN, NITRITE, LEUKOCYTESUR   STUDIES: No results found.   ELIGIBLE FOR AVAILABLE RESEARCH PROTOCOL: no  ASSESSMENT: 60 y.o. [place], [place] woman status post left breast upper outer quadrant biopsy 12/02/2015 for a clinical T1c pN0, stage IA  invasive ductal carcinoma, grade 3, estrogen and progesterone receptor positive, with an MIB-1 of 15%, and HER-2 amplification  (a) biopsy of a 0.6 cm lower outer quadrant right breast mass scheduled for 12/26/2015  (1) neoadjuvant chemotherapy consisting of weekly paclitaxel 12 with trastuzumab and pertuzumab every 21 days 4 started 12/27/2015  (a) paclitaxel discontinued after 7 doses because of development of peripheral neuropathy  (b) to receive carboplatin/gemcitabine weekly 5 beginning 02/21/2016  (c) cycle 3 of carboplatin/Gemzar delayed one week because of thrombocytopenia; carbo dose reduced 10%  (2) trastuzumab to be continued to total of one year  (a)  echo 12/16/2015 showed an ejection fraction in the 55-60% range.  (3) definitive surgery to follow chemotherapy: considering bilateral mastectomies  (4) adjuvant radiation to follow as appropriate  (5) anti-estrogens to follow at the completion of local treatment  (a) patient is considering bilateral salpingo-oophorectomy  (6) genetics testing 02/09/2016 through the 44-gene [facility] Custom Panel performed by [facility] [place], [place]) found no deleterious mutations in APC, ATM, AXIN2, BARD1, BMPR1A, BRCA1, BRCA2, BRIP1, CDH1, CDKN2A, CHEK2, DICER1, EPCAM, GREM1, KIT, MEN1, MLH1, MSH2, MSH6, MUTYH, NBN, NF1, NF2, PALB2, PDGFRA, PMS2, POLD1, POLE, PTEN, RAD50, RAD51C, RAD51D, SDHA, SDHB, SDHC, SDHD, SMAD4, SMARCA4, SMARE1, STK11, TP53, TSC1, TSC2, and VHL  (a)   One variant of uncertain 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 However we are sending a C. difficile sample today. It could also be due to the pertuzumab, but she has not had diarrhea from pertuzumab before  We discussed the appropriate way to receive Imodium. She will also receive intravenous fluids today and tomorrow.  We are holding her treatment today because her platelet count is less than 60,000. I'm going to drop her carboplatin dose by 10% hoping that we do not need to delay her treatments further. She will return next week for her third of 5 planned cycles of carboplatin and Gemzar.  She is very disappointed because this is going to delay her completion of chemotherapy and might delay her surgery.   She knows to call for any other problems that may develop before her next visit.   :Chauncey Cruel, MD   03/06/2016 11:04 AM Medical Oncology and Hematology [facility] [place], [place] [address] Tel. [phone]     Fax. [phone] 100 cGy.

## 2016-03-06 NOTE — Progress Notes (Signed)
NO CHEMO today per MD Magrinat due to PLT count. Pt is here for IVF and will come tomorrow for fluids too.   Pt attempted to give a stool specimen for Cdiff test but was unable to provide one. She is coming here tomorrow for IVF and will bring specimen if she obtains one by then.

## 2016-03-06 NOTE — Patient Instructions (Signed)
Dehydration, Adult Dehydration means your body does not have as much fluid or water as it needs. It happens when you take in less fluid than you lose. Your kidneys, brain, and heart will not work properly without the right amount of fluids.  Dehydration can range from mild to severe. It should be treated right away to help prevent it from becoming severe. HOME CARE  Drink enough fluid to keep your pee (urine) clear or pale yellow.  Drink water or fluid slowly by taking small sips. You can also try sucking on ice cubes.  Have food or drinks that contain electrolytes. Examples include bananas and sports drinks.  Take over-the-counter and prescription medicines only as told by your doctor.  Prepare oral rehydration solution (ORS) according to the instructions that came with it. Take sips of ORS every 5 minutes until your pee returns to normal.  If you are throwing up (vomiting) or have watery poop (diarrhea), keep trying to drink water, ORS, or both.  If you have watery poop, avoid:  Drinks with caffeine.  Fruit juice.  Milk.  Carbonated soft drinks.  Do not take salt tablets. This can lead to having too much sodium in your body (hypernatremia). GET HELP IF:  You cannot eat or drink without throwing up.  You have had mild watery poop for longer than 24 hours.  You have a fever. GET HELP RIGHT AWAY IF:   You have very strong thirst.  You have very bad watery poop.  You have not peed in 6-8 hours, or you have peed only a small amount of very dark pee.  You have shriveled skin.  You are dizzy, confused, or both.   This information is not intended to replace advice given to you by your health care provider. Make sure you discuss any questions you have with your health care provider.   Document Released: 03/07/2009 Document Revised: 01/30/2015 Document Reviewed: 09/26/2014 Elsevier Interactive Patient Education 2016 [place].   Diarrhea Diarrhea is frequent loose  and watery bowel movements. It can cause you to feel weak and dehydrated. Dehydration can cause you to become tired and thirsty, have a dry mouth, and have decreased urination that often is dark yellow. Diarrhea is a sign of another problem, most often an infection that will not last long. In most cases, diarrhea typically lasts 2-3 days. However, it can last longer if it is a sign of something more serious. It is important to treat your diarrhea as directed by your caregiver to lessen or prevent future episodes of diarrhea. CAUSES  Some common causes include:  Gastrointestinal infections caused by viruses, bacteria, or parasites.  Food poisoning or food allergies.  Certain medicines, such as antibiotics, chemotherapy, and laxatives.  Artificial sweeteners and fructose.  Digestive disorders. HOME CARE INSTRUCTIONS  Ensure adequate fluid intake (hydration): Have 1 cup (8 oz) of fluid for each diarrhea episode. Avoid fluids that contain simple sugars or sports drinks, fruit juices, whole milk products, and sodas. Your urine should be clear or pale yellow if you are drinking enough fluids. Hydrate with an oral rehydration solution that you can purchase at pharmacies, retail stores, and online. You can prepare an oral rehydration solution at home by mixing the following ingredients together:   - tsp table salt.   tsp baking soda.   tsp salt substitute containing potassium chloride.  1  tablespoons sugar.  1 L (34 oz) of water.  Certain foods and beverages may increase the speed at which food  moves through the gastrointestinal (GI) tract. These foods and beverages should be avoided and include:  Caffeinated and alcoholic beverages.  High-fiber foods, such as raw fruits and vegetables, nuts, seeds, and whole grain breads and cereals.  Foods and beverages sweetened with sugar alcohols, such as xylitol, sorbitol, and mannitol.  Some foods may be well tolerated and may help thicken stool  including:  Starchy foods, such as rice, toast, pasta, low-sugar cereal, oatmeal, grits, baked potatoes, crackers, and bagels.  Bananas.  Applesauce.  Add probiotic-rich foods to help increase healthy bacteria in the GI tract, such as yogurt and fermented milk products.  Wash your hands well after each diarrhea episode.  Only take over-the-counter or prescription medicines as directed by your caregiver.  Take a warm bath to relieve any burning or pain from frequent diarrhea episodes. SEEK IMMEDIATE MEDICAL CARE IF:   You are unable to keep fluids down.  You have persistent vomiting.  You have blood in your stool, or your stools are black and tarry.  You do not urinate in 6-8 hours, or there is only a small amount of very dark urine.  You have abdominal pain that increases or localizes.  You have weakness, dizziness, confusion, or light-headedness.  You have a severe headache.  Your diarrhea gets worse or does not get better.  You have a fever or persistent symptoms for more than 2-3 days.  You have a fever and your symptoms suddenly get worse. MAKE SURE YOU:   Understand these instructions.  Will watch your condition.  Will get help right away if you are not doing well or get worse.   This information is not intended to replace advice given to you by your health care provider. Make sure you discuss any questions you have with your health care provider.   Document Released: 05/01/2002 Document Revised: 06/01/2014 Document Reviewed: 01/17/2012 Elsevier Interactive Patient Education [employer].

## 2016-03-07 ENCOUNTER — Ambulatory Visit: Payer: BC Managed Care – PPO

## 2016-03-07 ENCOUNTER — Other Ambulatory Visit: Payer: Self-pay | Admitting: Oncology

## 2016-03-07 ENCOUNTER — Ambulatory Visit (HOSPITAL_BASED_OUTPATIENT_CLINIC_OR_DEPARTMENT_OTHER): Payer: BC Managed Care – PPO

## 2016-03-07 VITALS — BP 111/60 | HR 75 | Temp 99.3°F | Resp 17

## 2016-03-07 DIAGNOSIS — Z95828 Presence of other vascular implants and grafts: Secondary | ICD-10-CM

## 2016-03-07 DIAGNOSIS — Z170 Estrogen receptor positive status [ER+]: Secondary | ICD-10-CM

## 2016-03-07 DIAGNOSIS — C50412 Malignant neoplasm of upper-outer quadrant of left female breast: Secondary | ICD-10-CM

## 2016-03-07 MED ORDER — SODIUM CHLORIDE 0.9 % IV SOLN
Freq: Once | INTRAVENOUS | Status: AC
  Administered 2016-03-07: 09:00:00 via INTRAVENOUS

## 2016-03-07 MED ORDER — HEPARIN SOD (PORK) LOCK FLUSH 100 UNIT/ML IV SOLN
500.0000 [IU] | INTRAVENOUS | Status: AC | PRN
  Administered 2016-03-07: 500 [IU]
  Filled 2016-03-07: qty 5

## 2016-03-07 MED ORDER — SODIUM CHLORIDE 0.9 % IJ SOLN
10.0000 mL | INTRAMUSCULAR | Status: AC | PRN
  Administered 2016-03-07: 10 mL
  Filled 2016-03-07: qty 10

## 2016-03-07 MED ORDER — SODIUM CHLORIDE 0.9 % IV SOLN: INTRAVENOUS | Status: DC

## 2016-03-07 NOTE — Patient Instructions (Signed)
Dehydration, Adult Dehydration is a condition in which you do not have enough fluid or water in your body. It happens when you take in less fluid than you lose. Vital organs such as the kidneys, brain, and heart cannot function without a proper amount of fluids. Any loss of fluids from the body can cause dehydration.  Dehydration can range from mild to severe. This condition should be treated right away to help prevent it from becoming severe. CAUSES  This condition may be caused by:  Vomiting.  Diarrhea.  Excessive sweating, such as when exercising in hot or humid weather.  Not drinking enough fluid during strenuous exercise or during an illness.  Excessive urine output.  Fever.  Certain medicines. RISK FACTORS This condition is more likely to develop in:  People who are taking certain medicines that cause the body to lose excess fluid (diuretics).   People who have a chronic illness, such as diabetes, that may increase urination.  Older adults.   People who live at high altitudes.   People who participate in endurance sports.  SYMPTOMS  Mild Dehydration  Thirst.  Dry lips.  Slightly dry mouth.  Dry, warm skin. Moderate Dehydration  Very dry mouth.   Muscle cramps.   Dark urine and decreased urine production.   Decreased tear production.   Headache.   Light-headedness, especially when you stand up from a sitting position.  Severe Dehydration  Changes in skin.   Cold and clammy skin.   Skin does not spring back quickly when lightly pinched and released.   Changes in body fluids.   Extreme thirst.   No tears.   Not able to sweat when body temperature is high, such as in hot weather.   Minimal urine production.   Changes in vital signs.   Rapid, weak pulse (more than 100 beats per minute when you are sitting still).   Rapid breathing.   Low blood pressure.   Other changes.   Sunken eyes.   Cold hands and feet.    Confusion.  Lethargy and difficulty being awakened.  Fainting (syncope).   Short-term weight loss.   Unconsciousness. DIAGNOSIS  This condition may be diagnosed based on your symptoms. You may also have tests to determine how severe your dehydration is. These tests may include:   Urine tests.   Blood tests.  TREATMENT  Treatment for this condition depends on the severity. Mild or moderate dehydration can often be treated at home. Treatment should be started right away. Do not wait until dehydration becomes severe. Severe dehydration needs to be treated at the hospital. Treatment for Mild Dehydration  Drinking plenty of water to replace the fluid you have lost.   Replacing minerals in your blood (electrolytes) that you may have lost.  Treatment for Moderate Dehydration  Consuming oral rehydration solution (ORS). Treatment for Severe Dehydration  Receiving fluid through an IV tube.   Receiving electrolyte solution through a feeding tube that is passed through your nose and into your stomach (nasogastric tube or NG tube).  Correcting any abnormalities in electrolytes. HOME CARE INSTRUCTIONS   Drink enough fluid to keep your urine clear or pale yellow.   Drink water or fluid slowly by taking small sips. You can also try sucking on ice cubes.  Have food or beverages that contain electrolytes. Examples include bananas and sports drinks.  Take over-the-counter and prescription medicines only as told by your health care provider.   Prepare ORS according to the manufacturer's instructions. Take sips   of ORS every 5 minutes until your urine returns to normal.  If you have vomiting or diarrhea, continue to try to drink water, ORS, or both.   If you have diarrhea, avoid:   Beverages that contain caffeine.   Fruit juice.   Milk.   Carbonated soft drinks.  Do not take salt tablets. This can lead to the condition of having too much sodium in your body  (hypernatremia).  SEEK MEDICAL CARE IF:  You cannot eat or drink without vomiting.  You have had moderate diarrhea during a period of more than 24 hours.  You have a fever. SEEK IMMEDIATE MEDICAL CARE IF:   You have extreme thirst.  You have severe diarrhea.  You have not urinated in 6-8 hours, or you have urinated only a small amount of very dark urine.  You have shriveled skin.  You are dizzy, confused, or both.   This information is not intended to replace advice given to you by your health care provider. Make sure you discuss any questions you have with your health care provider.   Document Released: 05/11/2005 Document Revised: 01/30/2015 Document Reviewed: 09/26/2014 Elsevier Interactive Patient Education 2016 [employer]   Diarrhea Diarrhea is frequent loose and watery bowel movements. It can cause you to feel weak and dehydrated. Dehydration can cause you to become tired and thirsty, have a dry mouth, and have decreased urination that often is dark yellow. Diarrhea is a sign of another problem, most often an infection that will not last long. In most cases, diarrhea typically lasts 2-3 days. However, it can last longer if it is a sign of something more serious. It is important to treat your diarrhea as directed by your caregiver to lessen or prevent future episodes of diarrhea. CAUSES  Some common causes include:  Gastrointestinal infections caused by viruses, bacteria, or parasites.  Food poisoning or food allergies.  Certain medicines, such as antibiotics, chemotherapy, and laxatives.  Artificial sweeteners and fructose.  Digestive disorders. HOME CARE INSTRUCTIONS  Ensure adequate fluid intake (hydration): Have 1 cup (8 oz) of fluid for each diarrhea episode. Avoid fluids that contain simple sugars or sports drinks, fruit juices, whole milk products, and sodas. Your urine should be clear or pale yellow if you are drinking enough fluids. Hydrate with an oral  rehydration solution that you can purchase at pharmacies, retail stores, and online. You can prepare an oral rehydration solution at home by mixing the following ingredients together:   - tsp table salt.   tsp baking soda.   tsp salt substitute containing potassium chloride.  1  tablespoons sugar.  1 L (34 oz) of water.  Certain foods and beverages may increase the speed at which food moves through the gastrointestinal (GI) tract. These foods and beverages should be avoided and include:  Caffeinated and alcoholic beverages.  High-fiber foods, such as raw fruits and vegetables, nuts, seeds, and whole grain breads and cereals.  Foods and beverages sweetened with sugar alcohols, such as xylitol, sorbitol, and mannitol.  Some foods may be well tolerated and may help thicken stool including:  Starchy foods, such as rice, toast, pasta, low-sugar cereal, oatmeal, grits, baked potatoes, crackers, and bagels.  Bananas.  Applesauce.  Add probiotic-rich foods to help increase healthy bacteria in the GI tract, such as yogurt and fermented milk products.  Wash your hands well after each diarrhea episode.  Only take over-the-counter or prescription medicines as directed by your caregiver.  Take a warm bath to relieve any   burning or pain from frequent diarrhea episodes. SEEK IMMEDIATE MEDICAL CARE IF:   You are unable to keep fluids down.  You have persistent vomiting.  You have blood in your stool, or your stools are black and tarry.  You do not urinate in 6-8 hours, or there is only a small amount of very dark urine.  You have abdominal pain that increases or localizes.  You have weakness, dizziness, confusion, or light-headedness.  You have a severe headache.  Your diarrhea gets worse or does not get better.  You have a fever or persistent symptoms for more than 2-3 days.  You have a fever and your symptoms suddenly get worse. MAKE SURE YOU:   Understand these  instructions.  Will watch your condition.  Will get help right away if you are not doing well or get worse.   This information is not intended to replace advice given to you by your health care provider. Make sure you discuss any questions you have with your health care provider.   Document Released: 05/01/2002 Document Revised: 06/01/2014 Document Reviewed: 01/17/2012 Elsevier Interactive Patient Education 2016 [employer] Inc.  

## 2016-03-09 ENCOUNTER — Ambulatory Visit: Payer: BC Managed Care – PPO

## 2016-03-09 NOTE — Addendum Note (Signed)
Addended by: Margaret Pyle on: 03/09/2016 08:01 AM   Modules accepted: Orders

## 2016-03-10 ENCOUNTER — Ambulatory Visit: Payer: BC Managed Care – PPO

## 2016-03-11 ENCOUNTER — Telehealth: Payer: Self-pay

## 2016-03-11 NOTE — Telephone Encounter (Signed)
Pt was calling about appts. She is feeling well and won't come in for lab/MD on Thursday. She will come in for lab/infusion of Friday as scheduled. She is asking about her neupogen shots and if she could get an onpro. S/w Dr Jana Hakim and called pt back. She will need to get the 3 neupogen shots.

## 2016-03-12 ENCOUNTER — Encounter: Payer: Self-pay | Admitting: Pharmacist

## 2016-03-12 ENCOUNTER — Other Ambulatory Visit: Payer: BC Managed Care – PPO

## 2016-03-12 ENCOUNTER — Ambulatory Visit: Payer: BC Managed Care – PPO | Admitting: Oncology

## 2016-03-13 ENCOUNTER — Other Ambulatory Visit (HOSPITAL_BASED_OUTPATIENT_CLINIC_OR_DEPARTMENT_OTHER): Payer: BC Managed Care – PPO

## 2016-03-13 ENCOUNTER — Ambulatory Visit (HOSPITAL_BASED_OUTPATIENT_CLINIC_OR_DEPARTMENT_OTHER): Payer: BC Managed Care – PPO

## 2016-03-13 VITALS — BP 109/68 | HR 72 | Temp 97.9°F | Resp 18

## 2016-03-13 DIAGNOSIS — C50412 Malignant neoplasm of upper-outer quadrant of left female breast: Secondary | ICD-10-CM

## 2016-03-13 DIAGNOSIS — C773 Secondary and unspecified malignant neoplasm of axilla and upper limb lymph nodes: Secondary | ICD-10-CM

## 2016-03-13 DIAGNOSIS — Z5111 Encounter for antineoplastic chemotherapy: Secondary | ICD-10-CM

## 2016-03-13 DIAGNOSIS — Z170 Estrogen receptor positive status [ER+]: Principal | ICD-10-CM

## 2016-03-13 LAB — COMPREHENSIVE METABOLIC PANEL
ALBUMIN: 3.4 g/dL — AB (ref 3.5–5.0)
ALK PHOS: 105 U/L (ref 40–150)
ALT: 17 U/L (ref 0–55)
ANION GAP: 8 meq/L (ref 3–11)
AST: 18 U/L (ref 5–34)
BILIRUBIN TOTAL: 0.26 mg/dL (ref 0.20–1.20)
BUN: 14.8 mg/dL (ref 7.0–26.0)
CALCIUM: 9.2 mg/dL (ref 8.4–10.4)
CHLORIDE: 106 meq/L (ref 98–109)
CO2: 27 mEq/L (ref 22–29)
CREATININE: 0.7 mg/dL (ref 0.6–1.1)
EGFR: >90 mL/min/{1.73_m2} (ref >90)
Glucose: 88 mg/dl (ref 70–140)
Potassium: 4.4 mEq/L (ref 3.5–5.1)
Sodium: 141 mEq/L (ref 136–145)
Total Protein: 6.6 g/dL (ref 6.4–8.3)

## 2016-03-13 LAB — CBC WITH DIFFERENTIAL/PLATELET
BASO%: 0.5 % (ref 0.0–2.0)
BASOS ABS: 0 10*3/uL (ref 0.0–0.1)
EOS%: 0.7 % (ref 0.0–7.0)
Eosinophils Absolute: 0 10*3/uL (ref 0.0–0.5)
HEMATOCRIT: 32.2 % — AB (ref 34.8–46.6)
HEMOGLOBIN: 10.8 g/dL — AB (ref 11.6–15.9)
LYMPH#: 1.2 10*3/uL (ref 0.9–3.3)
LYMPH%: 22.7 % (ref 14.0–49.7)
MCH: 30.5 pg (ref 25.1–34.0)
MCHC: 33.7 g/dL (ref 31.5–36.0)
MCV: 90.7 fL (ref 79.5–101.0)
MONO#: 0.7 10*3/uL (ref 0.1–0.9)
MONO%: 13.9 % (ref 0.0–14.0)
NEUT#: 3.3 10*3/uL (ref 1.5–6.5)
NEUT%: 62.2 % (ref 38.4–76.8)
PLATELETS: 251 10*3/uL (ref 145–400)
RBC: 3.55 10*6/uL — ABNORMAL LOW (ref 3.70–5.45)
RDW: 16.1 % — AB (ref 11.2–14.5)
WBC: 5.3 10*3/uL (ref 3.9–10.3)

## 2016-03-13 MED ORDER — SODIUM CHLORIDE 0.9 % IV SOLN
197.6400 mg | Freq: Once | INTRAVENOUS | Status: AC
  Administered 2016-03-13: 200 mg via INTRAVENOUS
  Filled 2016-03-13: qty 20

## 2016-03-13 MED ORDER — PALONOSETRON HCL INJECTION 0.25 MG/5ML
0.2500 mg | Freq: Once | INTRAVENOUS | Status: AC
  Administered 2016-03-13: 0.25 mg via INTRAVENOUS

## 2016-03-13 MED ORDER — HEPARIN SOD (PORK) LOCK FLUSH 100 UNIT/ML IV SOLN
500.0000 [IU] | Freq: Once | INTRAVENOUS | Status: AC | PRN
  Administered 2016-03-13: 500 [IU]
  Filled 2016-03-13: qty 5

## 2016-03-13 MED ORDER — SODIUM CHLORIDE 0.9 % IV SOLN
Freq: Once | INTRAVENOUS | Status: AC
  Administered 2016-03-13: 08:00:00 via INTRAVENOUS

## 2016-03-13 MED ORDER — DEXAMETHASONE SODIUM PHOSPHATE 10 MG/ML IJ SOLN
INTRAMUSCULAR | Status: AC
  Filled 2016-03-13: qty 1

## 2016-03-13 MED ORDER — LORAZEPAM 2 MG/ML IJ SOLN
INTRAMUSCULAR | Status: AC
  Filled 2016-03-13: qty 1

## 2016-03-13 MED ORDER — DEXAMETHASONE SODIUM PHOSPHATE 10 MG/ML IJ SOLN
10.0000 mg | Freq: Once | INTRAMUSCULAR | Status: AC
  Administered 2016-03-13: 10 mg via INTRAVENOUS

## 2016-03-13 MED ORDER — PALONOSETRON HCL INJECTION 0.25 MG/5ML
INTRAVENOUS | Status: AC
  Filled 2016-03-13: qty 5

## 2016-03-13 MED ORDER — SODIUM CHLORIDE 0.9% FLUSH
10.0000 mL | INTRAVENOUS | Status: DC | PRN
  Administered 2016-03-13: 10 mL
  Filled 2016-03-13: qty 10

## 2016-03-13 MED ORDER — GEMCITABINE HCL CHEMO INJECTION 1 GM/26.3ML
1000.0000 mg/m2 | Freq: Once | INTRAVENOUS | Status: AC
  Administered 2016-03-13: 1862 mg via INTRAVENOUS
  Filled 2016-03-13: qty 48.97

## 2016-03-13 MED ORDER — LORAZEPAM 2 MG/ML IJ SOLN
0.5000 mg | Freq: Once | INTRAMUSCULAR | Status: AC
Start: 2016-03-13 — End: 2016-03-13
  Administered 2016-03-13: 0.5 mg via INTRAVENOUS

## 2016-03-13 NOTE — Patient Instructions (Signed)
[facility] Discharge Instructions for Patients Receiving Chemotherapy  Today you received the following chemotherapy agents:  Gemzar and Carboplatin.  To help prevent nausea and vomiting after your treatment, we encourage you to take your nausea medication as prescribed.   If you develop nausea and vomiting that is not controlled by your nausea medication, call the clinic.   BELOW ARE SYMPTOMS THAT SHOULD BE REPORTED IMMEDIATELY:  *FEVER GREATER THAN 100.5 F  *CHILLS WITH OR WITHOUT FEVER  NAUSEA AND VOMITING THAT IS NOT CONTROLLED WITH YOUR NAUSEA MEDICATION  *UNUSUAL SHORTNESS OF BREATH  *UNUSUAL BRUISING OR BLEEDING  TENDERNESS IN MOUTH AND THROAT WITH OR WITHOUT PRESENCE OF ULCERS  *URINARY PROBLEMS  *BOWEL PROBLEMS  UNUSUAL RASH Items with * indicate a potential emergency and should be followed up as soon as possible.  Feel free to call the clinic you have any questions or concerns. The clinic phone number is [phone].  Please show the CHEMO ALERT CARD at check-in to the Emergency Department and triage nurse.   

## 2016-03-14 ENCOUNTER — Ambulatory Visit (HOSPITAL_BASED_OUTPATIENT_CLINIC_OR_DEPARTMENT_OTHER): Payer: BC Managed Care – PPO

## 2016-03-14 ENCOUNTER — Ambulatory Visit: Payer: BC Managed Care – PPO

## 2016-03-14 VITALS — BP 110/72 | HR 70 | Temp 98.9°F | Resp 18

## 2016-03-14 DIAGNOSIS — C50412 Malignant neoplasm of upper-outer quadrant of left female breast: Secondary | ICD-10-CM | POA: Diagnosis not present

## 2016-03-14 DIAGNOSIS — Z170 Estrogen receptor positive status [ER+]: Secondary | ICD-10-CM

## 2016-03-14 DIAGNOSIS — Z95828 Presence of other vascular implants and grafts: Secondary | ICD-10-CM

## 2016-03-14 MED ORDER — TBO-FILGRASTIM 300 MCG/0.5ML ~~LOC~~ SOSY
300.0000 ug | PREFILLED_SYRINGE | Freq: Once | SUBCUTANEOUS | Status: AC
  Administered 2016-03-14: 300 ug via SUBCUTANEOUS

## 2016-03-16 ENCOUNTER — Ambulatory Visit: Payer: BC Managed Care – PPO

## 2016-03-16 VITALS — BP 110/68 | HR 84 | Temp 98.6°F | Resp 18

## 2016-03-16 DIAGNOSIS — C50412 Malignant neoplasm of upper-outer quadrant of left female breast: Secondary | ICD-10-CM

## 2016-03-16 DIAGNOSIS — Z95828 Presence of other vascular implants and grafts: Secondary | ICD-10-CM

## 2016-03-16 DIAGNOSIS — Z170 Estrogen receptor positive status [ER+]: Secondary | ICD-10-CM

## 2016-03-16 MED ORDER — TBO-FILGRASTIM 300 MCG/0.5ML ~~LOC~~ SOSY
300.0000 ug | PREFILLED_SYRINGE | Freq: Once | SUBCUTANEOUS | Status: DC
  Filled 2016-03-16: qty 0.5

## 2016-03-17 ENCOUNTER — Telehealth: Payer: Self-pay | Admitting: *Deleted

## 2016-03-17 ENCOUNTER — Ambulatory Visit: Payer: BC Managed Care – PPO

## 2016-03-17 VITALS — BP 111/77 | HR 84 | Temp 98.7°F | Resp 18

## 2016-03-17 DIAGNOSIS — Z170 Estrogen receptor positive status [ER+]: Secondary | ICD-10-CM

## 2016-03-17 DIAGNOSIS — Z95828 Presence of other vascular implants and grafts: Secondary | ICD-10-CM

## 2016-03-17 DIAGNOSIS — C50412 Malignant neoplasm of upper-outer quadrant of left female breast: Secondary | ICD-10-CM

## 2016-03-17 MED ORDER — TBO-FILGRASTIM 300 MCG/0.5ML ~~LOC~~ SOSY
300.0000 ug | PREFILLED_SYRINGE | Freq: Once | SUBCUTANEOUS | Status: DC
  Filled 2016-03-17: qty 0.5

## 2016-03-17 NOTE — Telephone Encounter (Signed)
Return call from patient. Advised of need to reschedule PUS. Offered appointment same day at hospital if needs to keep on same day or can reschedule to a different day in office. Appointment rescheduled to 03-26-16 at 430 here in office per patient preference.   Routing to provider for final review. Patient agreeable to disposition. Will close encounter.

## 2016-03-17 NOTE — Telephone Encounter (Signed)
Call to patient regarding ultrasound appointment for 03-19-16. Let message to call back.

## 2016-03-18 ENCOUNTER — Other Ambulatory Visit (HOSPITAL_COMMUNITY): Payer: Self-pay | Admitting: *Deleted

## 2016-03-19 ENCOUNTER — Other Ambulatory Visit: Payer: Self-pay

## 2016-03-19 ENCOUNTER — Ambulatory Visit: Payer: Self-pay | Admitting: Obstetrics & Gynecology

## 2016-03-19 ENCOUNTER — Other Ambulatory Visit: Payer: Self-pay | Admitting: *Deleted

## 2016-03-19 ENCOUNTER — Other Ambulatory Visit: Payer: Self-pay | Admitting: Obstetrics & Gynecology

## 2016-03-19 ENCOUNTER — Telehealth: Payer: Self-pay | Admitting: *Deleted

## 2016-03-19 MED ORDER — CIPROFLOXACIN HCL 500 MG PO TABS: 500.0000 mg | ORAL_TABLET | Freq: Two times a day (BID) | ORAL | 0 refills | Status: DC

## 2016-03-19 NOTE — Telephone Encounter (Signed)
Received vm call from pt stating that she has a UTI & needs a prescription & Cipro usually works & asked if script could be called to [facility].  She reports having an appt tomorrow but would like script today b/c symptoms started with burning & freq this am & she knows it will only get worse.  She says she used to get them a lot. Message to Dr Jana Hakim.

## 2016-03-20 ENCOUNTER — Ambulatory Visit (HOSPITAL_COMMUNITY): Payer: BC Managed Care – PPO | Admitting: Internal Medicine

## 2016-03-20 ENCOUNTER — Other Ambulatory Visit (HOSPITAL_COMMUNITY): Payer: BC Managed Care – PPO

## 2016-03-20 ENCOUNTER — Other Ambulatory Visit (HOSPITAL_BASED_OUTPATIENT_CLINIC_OR_DEPARTMENT_OTHER): Payer: BC Managed Care – PPO

## 2016-03-20 ENCOUNTER — Ambulatory Visit (HOSPITAL_BASED_OUTPATIENT_CLINIC_OR_DEPARTMENT_OTHER): Payer: BC Managed Care – PPO

## 2016-03-20 VITALS — BP 118/72 | HR 70 | Temp 98.4°F | Resp 16

## 2016-03-20 DIAGNOSIS — C773 Secondary and unspecified malignant neoplasm of axilla and upper limb lymph nodes: Secondary | ICD-10-CM

## 2016-03-20 DIAGNOSIS — C50412 Malignant neoplasm of upper-outer quadrant of left female breast: Secondary | ICD-10-CM | POA: Diagnosis not present

## 2016-03-20 DIAGNOSIS — Z5111 Encounter for antineoplastic chemotherapy: Secondary | ICD-10-CM | POA: Diagnosis not present

## 2016-03-20 DIAGNOSIS — Z5112 Encounter for antineoplastic immunotherapy: Secondary | ICD-10-CM

## 2016-03-20 DIAGNOSIS — Z170 Estrogen receptor positive status [ER+]: Principal | ICD-10-CM

## 2016-03-20 LAB — COMPREHENSIVE METABOLIC PANEL
ALBUMIN: 3.7 g/dL (ref 3.5–5.0)
ALK PHOS: 125 U/L (ref 40–150)
ALT: 41 U/L (ref 0–55)
AST: 22 U/L (ref 5–34)
Anion Gap: 7 mEq/L (ref 3–11)
BUN: 15.1 mg/dL (ref 7.0–26.0)
CHLORIDE: 104 meq/L (ref 98–109)
CO2: 29 meq/L (ref 22–29)
Calcium: 9.1 mg/dL (ref 8.4–10.4)
Creatinine: 0.7 mg/dL (ref 0.6–1.1)
EGFR: >90 mL/min/{1.73_m2} (ref >90)
GLUCOSE: 88 mg/dL (ref 70–140)
POTASSIUM: 4.2 meq/L (ref 3.5–5.1)
SODIUM: 140 meq/L (ref 136–145)
Total Bilirubin: 0.22 mg/dL (ref 0.20–1.20)
Total Protein: 7.1 g/dL (ref 6.4–8.3)

## 2016-03-20 LAB — CBC WITH DIFFERENTIAL/PLATELET
BASO%: 0.6 % (ref 0.0–2.0)
BASOS ABS: 0 10*3/uL (ref 0.0–0.1)
EOS%: 0.4 % (ref 0.0–7.0)
Eosinophils Absolute: 0 10*3/uL (ref 0.0–0.5)
HCT: 31.5 % — ABNORMAL LOW (ref 34.8–46.6)
HEMOGLOBIN: 10.7 g/dL — AB (ref 11.6–15.9)
LYMPH%: 24.9 % (ref 14.0–49.7)
MCH: 30.6 pg (ref 25.1–34.0)
MCHC: 34 g/dL (ref 31.5–36.0)
MCV: 90 fL (ref 79.5–101.0)
MONO#: 0.8 10*3/uL (ref 0.1–0.9)
MONO%: 12.4 % (ref 0.0–14.0)
NEUT#: 4.1 10*3/uL (ref 1.5–6.5)
NEUT%: 61.7 % (ref 38.4–76.8)
Platelets: 234 10*3/uL (ref 145–400)
RBC: 3.5 10*6/uL — AB (ref 3.70–5.45)
RDW: 15.5 % — ABNORMAL HIGH (ref 11.2–14.5)
WBC: 6.7 10*3/uL (ref 3.9–10.3)
lymph#: 1.7 10*3/uL (ref 0.9–3.3)
nRBC: 0 % (ref 0–0)

## 2016-03-20 MED ORDER — SODIUM CHLORIDE 0.9% FLUSH
10.0000 mL | INTRAVENOUS | Status: DC | PRN
  Administered 2016-03-20: 10 mL
  Filled 2016-03-20: qty 10

## 2016-03-20 MED ORDER — DEXAMETHASONE SODIUM PHOSPHATE 10 MG/ML IJ SOLN
10.0000 mg | Freq: Once | INTRAMUSCULAR | Status: AC
  Administered 2016-03-20: 10 mg via INTRAVENOUS

## 2016-03-20 MED ORDER — PALONOSETRON HCL INJECTION 0.25 MG/5ML
INTRAVENOUS | Status: AC
  Filled 2016-03-20: qty 5

## 2016-03-20 MED ORDER — PALONOSETRON HCL INJECTION 0.25 MG/5ML
0.2500 mg | Freq: Once | INTRAVENOUS | Status: AC
  Administered 2016-03-20: 0.25 mg via INTRAVENOUS

## 2016-03-20 MED ORDER — SODIUM CHLORIDE 0.9 % IV SOLN
197.6400 mg | Freq: Once | INTRAVENOUS | Status: AC
  Administered 2016-03-20: 200 mg via INTRAVENOUS
  Filled 2016-03-20: qty 20

## 2016-03-20 MED ORDER — ACETAMINOPHEN 325 MG PO TABS
ORAL_TABLET | ORAL | Status: AC
  Filled 2016-03-20: qty 2

## 2016-03-20 MED ORDER — SODIUM CHLORIDE 0.9 % IV SOLN
Freq: Once | INTRAVENOUS | Status: AC
  Administered 2016-03-20: 09:00:00 via INTRAVENOUS

## 2016-03-20 MED ORDER — ACETAMINOPHEN 325 MG PO TABS
650.0000 mg | ORAL_TABLET | Freq: Once | ORAL | Status: AC
  Administered 2016-03-20: 650 mg via ORAL

## 2016-03-20 MED ORDER — LORAZEPAM 2 MG/ML IJ SOLN
0.5000 mg | Freq: Once | INTRAMUSCULAR | Status: AC
  Administered 2016-03-20: 0.5 mg via INTRAVENOUS

## 2016-03-20 MED ORDER — DEXAMETHASONE SODIUM PHOSPHATE 10 MG/ML IJ SOLN
INTRAMUSCULAR | Status: AC
  Filled 2016-03-20: qty 1

## 2016-03-20 MED ORDER — HEPARIN SOD (PORK) LOCK FLUSH 100 UNIT/ML IV SOLN
500.0000 [IU] | Freq: Once | INTRAVENOUS | Status: AC | PRN
  Administered 2016-03-20: 500 [IU]
  Filled 2016-03-20: qty 5

## 2016-03-20 MED ORDER — SODIUM CHLORIDE 0.9 % IV SOLN
1000.0000 mg/m2 | Freq: Once | INTRAVENOUS | Status: AC
  Administered 2016-03-20: 1862 mg via INTRAVENOUS
  Filled 2016-03-20: qty 48.97

## 2016-03-20 MED ORDER — LORAZEPAM 2 MG/ML IJ SOLN
INTRAMUSCULAR | Status: AC
  Filled 2016-03-20: qty 1

## 2016-03-20 MED ORDER — TRASTUZUMAB CHEMO 150 MG IV SOLR
6.0000 mg/kg | Freq: Once | INTRAVENOUS | Status: AC
  Administered 2016-03-20: 420 mg via INTRAVENOUS
  Filled 2016-03-20: qty 20

## 2016-03-20 MED ORDER — SODIUM CHLORIDE 0.9 % IV SOLN
420.0000 mg | Freq: Once | INTRAVENOUS | Status: AC
  Administered 2016-03-20: 420 mg via INTRAVENOUS
  Filled 2016-03-20: qty 14

## 2016-03-20 NOTE — Patient Instructions (Signed)
[facility] Discharge Instructions for Patients Receiving Chemotherapy  Today you received the following chemotherapy agent(s) Herceptin, Perjeta, Gemzar, and Carboplatin. To help prevent nausea and vomiting after your treatment, we encourage you to take your nausea medication as prescribed.  If you develop nausea and vomiting that is not controlled by your nausea medication, call the clinic.   BELOW ARE SYMPTOMS THAT SHOULD BE REPORTED IMMEDIATELY:  *FEVER GREATER THAN 100.5 F  *CHILLS WITH OR WITHOUT FEVER  NAUSEA AND VOMITING THAT IS NOT CONTROLLED WITH YOUR NAUSEA MEDICATION  *UNUSUAL SHORTNESS OF BREATH  *UNUSUAL BRUISING OR BLEEDING  TENDERNESS IN MOUTH AND THROAT WITH OR WITHOUT PRESENCE OF ULCERS  *URINARY PROBLEMS  *BOWEL PROBLEMS  UNUSUAL RASH Items with * indicate a potential emergency and should be followed up as soon as possible.  Feel free to call the clinic you have any questions or concerns. The clinic phone number is [phone].  Please show the [place] at check-in to the Emergency Department and triage nurse.

## 2016-03-21 ENCOUNTER — Ambulatory Visit (HOSPITAL_BASED_OUTPATIENT_CLINIC_OR_DEPARTMENT_OTHER): Payer: BC Managed Care – PPO

## 2016-03-21 VITALS — BP 112/95 | HR 79 | Temp 98.2°F | Resp 18

## 2016-03-21 DIAGNOSIS — C50412 Malignant neoplasm of upper-outer quadrant of left female breast: Secondary | ICD-10-CM | POA: Diagnosis not present

## 2016-03-21 DIAGNOSIS — Z170 Estrogen receptor positive status [ER+]: Secondary | ICD-10-CM

## 2016-03-21 DIAGNOSIS — Z95828 Presence of other vascular implants and grafts: Secondary | ICD-10-CM

## 2016-03-21 MED ORDER — TBO-FILGRASTIM 300 MCG/0.5ML ~~LOC~~ SOSY
300.0000 ug | PREFILLED_SYRINGE | Freq: Once | SUBCUTANEOUS | Status: AC
  Administered 2016-03-21: 300 ug via SUBCUTANEOUS

## 2016-03-21 NOTE — Patient Instructions (Signed)

## 2016-03-23 ENCOUNTER — Ambulatory Visit (HOSPITAL_BASED_OUTPATIENT_CLINIC_OR_DEPARTMENT_OTHER): Payer: BC Managed Care – PPO

## 2016-03-23 VITALS — BP 110/73 | HR 84 | Temp 98.5°F | Resp 18

## 2016-03-23 DIAGNOSIS — Z95828 Presence of other vascular implants and grafts: Secondary | ICD-10-CM

## 2016-03-23 DIAGNOSIS — C773 Secondary and unspecified malignant neoplasm of axilla and upper limb lymph nodes: Secondary | ICD-10-CM | POA: Diagnosis not present

## 2016-03-23 DIAGNOSIS — Z170 Estrogen receptor positive status [ER+]: Secondary | ICD-10-CM

## 2016-03-23 DIAGNOSIS — Z5189 Encounter for other specified aftercare: Secondary | ICD-10-CM | POA: Diagnosis not present

## 2016-03-23 DIAGNOSIS — C50412 Malignant neoplasm of upper-outer quadrant of left female breast: Secondary | ICD-10-CM

## 2016-03-23 MED ORDER — TBO-FILGRASTIM 300 MCG/0.5ML ~~LOC~~ SOSY
300.0000 ug | PREFILLED_SYRINGE | Freq: Once | SUBCUTANEOUS | Status: AC
  Administered 2016-03-23: 300 ug via SUBCUTANEOUS
  Filled 2016-03-23: qty 0.5

## 2016-03-24 ENCOUNTER — Ambulatory Visit (HOSPITAL_BASED_OUTPATIENT_CLINIC_OR_DEPARTMENT_OTHER): Payer: BC Managed Care – PPO

## 2016-03-24 VITALS — BP 130/67 | HR 85 | Temp 98.6°F | Resp 18

## 2016-03-24 DIAGNOSIS — C50412 Malignant neoplasm of upper-outer quadrant of left female breast: Secondary | ICD-10-CM | POA: Diagnosis not present

## 2016-03-24 DIAGNOSIS — C773 Secondary and unspecified malignant neoplasm of axilla and upper limb lymph nodes: Secondary | ICD-10-CM | POA: Diagnosis not present

## 2016-03-24 DIAGNOSIS — Z95828 Presence of other vascular implants and grafts: Secondary | ICD-10-CM

## 2016-03-24 DIAGNOSIS — Z5189 Encounter for other specified aftercare: Secondary | ICD-10-CM | POA: Diagnosis not present

## 2016-03-24 DIAGNOSIS — Z170 Estrogen receptor positive status [ER+]: Secondary | ICD-10-CM

## 2016-03-24 MED ORDER — TBO-FILGRASTIM 300 MCG/0.5ML ~~LOC~~ SOSY
300.0000 ug | PREFILLED_SYRINGE | Freq: Once | SUBCUTANEOUS | Status: AC
  Administered 2016-03-24: 300 ug via SUBCUTANEOUS
  Filled 2016-03-24: qty 0.5

## 2016-03-26 ENCOUNTER — Ambulatory Visit (INDEPENDENT_AMBULATORY_CARE_PROVIDER_SITE_OTHER): Payer: BC Managed Care – PPO | Admitting: Obstetrics & Gynecology

## 2016-03-26 ENCOUNTER — Ambulatory Visit (INDEPENDENT_AMBULATORY_CARE_PROVIDER_SITE_OTHER): Payer: BC Managed Care – PPO

## 2016-03-26 DIAGNOSIS — Z853 Personal history of malignant neoplasm of breast: Secondary | ICD-10-CM

## 2016-03-26 DIAGNOSIS — Z8041 Family history of malignant neoplasm of ovary: Secondary | ICD-10-CM

## 2016-03-26 DIAGNOSIS — Z8543 Personal history of malignant neoplasm of ovary: Secondary | ICD-10-CM

## 2016-03-26 NOTE — Progress Notes (Signed)
PUS performed.  Results will be called to pt.  She is undergoing treatment for breast cancer at this time.

## 2016-03-27 ENCOUNTER — Encounter: Payer: Self-pay | Admitting: *Deleted

## 2016-03-27 ENCOUNTER — Other Ambulatory Visit (HOSPITAL_BASED_OUTPATIENT_CLINIC_OR_DEPARTMENT_OTHER): Payer: BC Managed Care – PPO

## 2016-03-27 ENCOUNTER — Ambulatory Visit (HOSPITAL_BASED_OUTPATIENT_CLINIC_OR_DEPARTMENT_OTHER): Payer: BC Managed Care – PPO

## 2016-03-27 ENCOUNTER — Ambulatory Visit (HOSPITAL_BASED_OUTPATIENT_CLINIC_OR_DEPARTMENT_OTHER): Payer: BC Managed Care – PPO | Admitting: Oncology

## 2016-03-27 ENCOUNTER — Ambulatory Visit (HOSPITAL_COMMUNITY)
Admission: RE | Admit: 2016-03-27 | Discharge: 2016-03-27 | Disposition: A | Discharge status: Home / Self Care | Payer: BC Managed Care – PPO | Source: Ambulatory Visit | Attending: Internal Medicine | Admitting: Internal Medicine

## 2016-03-27 VITALS — BP 114/71 | HR 84 | Temp 98.2°F | Resp 18 | Ht 67.0 in | Wt 163.8 lb

## 2016-03-27 DIAGNOSIS — Z170 Estrogen receptor positive status [ER+]: Secondary | ICD-10-CM

## 2016-03-27 DIAGNOSIS — C773 Secondary and unspecified malignant neoplasm of axilla and upper limb lymph nodes: Secondary | ICD-10-CM

## 2016-03-27 DIAGNOSIS — Z5111 Encounter for antineoplastic chemotherapy: Secondary | ICD-10-CM

## 2016-03-27 DIAGNOSIS — C50412 Malignant neoplasm of upper-outer quadrant of left female breast: Secondary | ICD-10-CM

## 2016-03-27 DIAGNOSIS — Z5189 Encounter for other specified aftercare: Secondary | ICD-10-CM

## 2016-03-27 DIAGNOSIS — R079 Chest pain, unspecified: Secondary | ICD-10-CM | POA: Diagnosis not present

## 2016-03-27 LAB — CBC WITH DIFFERENTIAL/PLATELET
BASO%: 0.3 % (ref 0.0–2.0)
BASOS ABS: 0 10*3/uL (ref 0.0–0.1)
EOS ABS: 0 10*3/uL (ref 0.0–0.5)
EOS%: 0.5 % (ref 0.0–7.0)
HCT: 31.1 % — ABNORMAL LOW (ref 34.8–46.6)
HEMOGLOBIN: 10.5 g/dL — AB (ref 11.6–15.9)
LYMPH#: 0.8 10*3/uL — AB (ref 0.9–3.3)
LYMPH%: 12.3 % — ABNORMAL LOW (ref 14.0–49.7)
MCH: 30.6 pg (ref 25.1–34.0)
MCHC: 33.7 g/dL (ref 31.5–36.0)
MCV: 91 fL (ref 79.5–101.0)
MONO#: 0.8 10*3/uL (ref 0.1–0.9)
MONO%: 11.5 % (ref 0.0–14.0)
NEUT#: 5 10*3/uL (ref 1.5–6.5)
NEUT%: 75.4 % (ref 38.4–76.8)
Platelets: 89 10*3/uL — ABNORMAL LOW (ref 145–400)
RBC: 3.42 10*6/uL — ABNORMAL LOW (ref 3.70–5.45)
RDW: 16.3 % — AB (ref 11.2–14.5)
WBC: 6.6 10*3/uL (ref 3.9–10.3)

## 2016-03-27 LAB — COMPREHENSIVE METABOLIC PANEL
ALBUMIN: 3.8 g/dL (ref 3.5–5.0)
ALK PHOS: 141 U/L (ref 40–150)
ALT: 54 U/L (ref 0–55)
ANION GAP: 7 meq/L (ref 3–11)
AST: 26 U/L (ref 5–34)
BUN: 10.6 mg/dL (ref 7.0–26.0)
CALCIUM: 8.9 mg/dL (ref 8.4–10.4)
CO2: 27 mEq/L (ref 22–29)
Chloride: 105 mEq/L (ref 98–109)
Creatinine: 0.6 mg/dL (ref 0.6–1.1)
Glucose: 94 mg/dl (ref 70–140)
POTASSIUM: 3.9 meq/L (ref 3.5–5.1)
Sodium: 138 mEq/L (ref 136–145)
Total Bilirubin: 0.31 mg/dL (ref 0.20–1.20)
Total Protein: 7 g/dL (ref 6.4–8.3)

## 2016-03-27 MED ORDER — SODIUM CHLORIDE 0.9 % IV SOLN
800.0000 mg/m2 | Freq: Once | INTRAVENOUS | Status: AC
  Administered 2016-03-27: 1482 mg via INTRAVENOUS
  Filled 2016-03-27: qty 38.98

## 2016-03-27 MED ORDER — HEPARIN SOD (PORK) LOCK FLUSH 100 UNIT/ML IV SOLN
500.0000 [IU] | Freq: Once | INTRAVENOUS | Status: AC | PRN
  Administered 2016-03-27: 500 [IU]
  Filled 2016-03-27: qty 5

## 2016-03-27 MED ORDER — PEGFILGRASTIM 6 MG/0.6ML ~~LOC~~ PSKT
6.0000 mg | PREFILLED_SYRINGE | Freq: Once | SUBCUTANEOUS | Status: AC
  Administered 2016-03-27: 6 mg via SUBCUTANEOUS
  Filled 2016-03-27: qty 0.6

## 2016-03-27 MED ORDER — PALONOSETRON HCL INJECTION 0.25 MG/5ML
INTRAVENOUS | Status: AC
  Filled 2016-03-27: qty 5

## 2016-03-27 MED ORDER — PALONOSETRON HCL INJECTION 0.25 MG/5ML
0.2500 mg | Freq: Once | INTRAVENOUS | Status: AC
  Administered 2016-03-27: 0.25 mg via INTRAVENOUS

## 2016-03-27 MED ORDER — SODIUM CHLORIDE 0.9 % IV SOLN
180.0000 mg | Freq: Once | INTRAVENOUS | Status: AC
  Administered 2016-03-27: 180 mg via INTRAVENOUS
  Filled 2016-03-27: qty 18

## 2016-03-27 MED ORDER — SODIUM CHLORIDE 0.9% FLUSH
10.0000 mL | INTRAVENOUS | Status: DC | PRN
  Administered 2016-03-27: 10 mL
  Filled 2016-03-27: qty 10

## 2016-03-27 MED ORDER — SODIUM CHLORIDE 0.9 % IV SOLN
180.0000 mg | Freq: Once | INTRAVENOUS | Status: DC
  Filled 2016-03-27: qty 18

## 2016-03-27 MED ORDER — LORAZEPAM 2 MG/ML IJ SOLN
0.5000 mg | Freq: Once | INTRAMUSCULAR | Status: AC
  Administered 2016-03-27: 0.5 mg via INTRAVENOUS

## 2016-03-27 MED ORDER — SODIUM CHLORIDE 0.9 % IV SOLN
800.0000 mg/m2 | Freq: Once | INTRAVENOUS | Status: DC
  Filled 2016-03-27: qty 39

## 2016-03-27 MED ORDER — DEXAMETHASONE SODIUM PHOSPHATE 10 MG/ML IJ SOLN
10.0000 mg | Freq: Once | INTRAMUSCULAR | Status: AC
  Administered 2016-03-27: 10 mg via INTRAVENOUS

## 2016-03-27 MED ORDER — LORAZEPAM 2 MG/ML IJ SOLN
INTRAMUSCULAR | Status: AC
  Filled 2016-03-27: qty 1

## 2016-03-27 MED ORDER — SODIUM CHLORIDE 0.9 % IV SOLN
Freq: Once | INTRAVENOUS | Status: AC
  Administered 2016-03-27: 12:00:00 via INTRAVENOUS

## 2016-03-27 MED ORDER — DEXAMETHASONE SODIUM PHOSPHATE 10 MG/ML IJ SOLN
INTRAMUSCULAR | Status: AC
  Filled 2016-03-27: qty 1

## 2016-03-27 MED ORDER — COLD PACK MISC ONCOLOGY
1.0000 | Freq: Once | Status: DC | PRN
  Filled 2016-03-27: qty 1

## 2016-03-27 NOTE — Progress Notes (Signed)
Per Dr. Jana Hakim, okay to tx with platelets 89, carboplatin dose to be reduced.

## 2016-03-27 NOTE — Progress Notes (Signed)
  Echocardiogram 2D Echocardiogram has been performed.  Alexandra Mathis 03/27/2016, 4:22 PM

## 2016-03-27 NOTE — Patient Instructions (Signed)
[facility] Discharge Instructions for Patients Receiving Chemotherapy  Today you received the following chemotherapy agents Gemzar, Carboplatin.   To help prevent nausea and vomiting after your treatment, we encourage you to take your nausea medication as prescribed.    If you develop nausea and vomiting that is not controlled by your nausea medication, call the clinic.   BELOW ARE SYMPTOMS THAT SHOULD BE REPORTED IMMEDIATELY:  *FEVER GREATER THAN 100.5 F  *CHILLS WITH OR WITHOUT FEVER  NAUSEA AND VOMITING THAT IS NOT CONTROLLED WITH YOUR NAUSEA MEDICATION  *UNUSUAL SHORTNESS OF BREATH  *UNUSUAL BRUISING OR BLEEDING  TENDERNESS IN MOUTH AND THROAT WITH OR WITHOUT PRESENCE OF ULCERS  *URINARY PROBLEMS  *BOWEL PROBLEMS  UNUSUAL RASH Items with * indicate a potential emergency and should be followed up as soon as possible.  Feel free to call the clinic you have any questions or concerns. The clinic phone number is [phone].  Please show the CHEMO ALERT CARD at check-in to the Emergency Department and triage nurse.   

## 2016-03-27 NOTE — Progress Notes (Signed)
Alexandra Mathis is arrived as perhis arival she went to the tolerating she wasproposed thatshe didn't elaborate [place]  Telephone:([phone]) [phone] Fax:(336) [phone]     ID: HEIDEE AUDI DOB: [birth year]  MR#: [id number]  ZTI#:[id number]  Patient Care Team: Darrol Jump, PA-C as PCP - General (Family Medicine) Fanny Skates, MD as Consulting Physician (General Surgery) Chauncey Cruel, MD as Consulting Physician (Oncology) Kyung Rudd, MD as Consulting Physician (Radiation Oncology) Larey Dresser, MD as Consulting Physician (Cardiology) Irene Limbo, MD as Consulting Physician (Plastic Surgery) Megan Salon, MD as Consulting Physician (Gynecology) OTHER MD:  CHIEF COMPLAINT: Triple positive breast cancer  CURRENT TREATMENT: trastuzumab; awaiting definitive surgery   BREAST CANCER HISTORY: From the original intake note:  "Alexandra Mathis" had screening mammography in June 2017 showing left breast upper outer quadrant calcifications. Accordingly she was referred for left diagnostic mammography and ultrasonography, performed 11/27/2015. The breast density was category C. Compression views of the left breast found pleomorphic calcifications in the upper outer quadrant measuring up to 4.0 cm. There was an area of associated architectural distortion in the upper outer quadrant as well. On physical exam there was an irregular thickening in the left breast at the 2:00 position anteriorly. Ultrasonography confirmed a hypoechoic irregular mass measuring 1.6 cm in the 2:00 position 2 cm from the nipple. An intramammary lymph node was noted with benign appearing. In the left axilla there was a lymph node with focally thickened cortex.  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 Biopsy of the left axillary lymph node was negative.  Her subsequent history is as detailed below  INTERVAL HISTORY: Alexandra Mathis returns today for follow-up of her estrogen receptor positive breast cancer accompanied by her husband. Today is day 1 cycle 5 of 5 planned doses of carboplatin and gemcitabine, given weekly, with continuing trastuzumab and pertuzumab every 3 weeks.   She had an echocardiogram earlier today, which shows a well-preserved ejection fraction.  REVIEW OF SYSTEMS: Alexandra Mathis felt grade for the first couple of days after chemotherapy last time, then became nauseated. She took Compazine and Zofran for 3 days and then she became constipated. She then took laxatives and had diarrhea. This was 6 liquid bowel movements which left her very tired. She hydrated herself, slept a lot over the weekend, and by Sunday last week she felt great again and was ready to get back to work on Monday, which she did without problems. Basically this is the same pattern she has had except that it was a little bit more prolonged. She also had some soreness in the back of her throat and she brought me a couple of photographs from last week which we will try to scan into her record. Incidentally she has begun to lose her eyebrows. Her peripheral neuropathy is no better and no worse than before Aside from these issues a detailed review of systems today was stable.  PAST MEDICAL HISTORY: Past Medical History:  Diagnosis Date  . Breast cancer ([place])   . Breast cancer of upper-outer quadrant of left female breast ([place]) 12/05/2015  . Chest pain   . Family history of premature CAD   . Fibroid   . HLD (hyperlipidemia)  PAST SURGICAL HISTORY: Past Surgical History:  Procedure Laterality Date  . ANTERIOR CRUCIATE LIGAMENT REPAIR     right knee  . CESAREAN SECTION     x 4, last was cesarean hysterectomy  . cesarean  section/hysterectomy     done with last cesarean section  . GANGLION CYST EXCISION    . LAPAROSCOPIC ENDOMETRIOSIS FULGURATION    . PORTACATH PLACEMENT Right 12/25/2015   Procedure: INSERTION PORT-A-CATH WITH [place];  Surgeon: Fanny Skates, MD;  Location: [facility];  Service: General;  Laterality: Right;  . TONSILLECTOMY    . WISDOM TOOTH EXTRACTION      FAMILY HISTORY Family History  Problem Relation Age of Onset  . Other Mother     benign meningioma dx 45 s/p surgery; hx of hysterectomy for hemorrhaging  . Hypothyroidism Mother   . CAD Mother   . Colon cancer Father 61    w/ mets to lungs and liver; + chemo; d. 27  . Gout Father   . Arthritis Father   . Ovarian cancer Sister     early 18s; s/p USO  . Breast cancer Sister 51    s/p mastectomy and aromatase inhibitor  . Other Paternal Aunt 59    Primary peritoneal cancer; s/p surgery  . Cancer Paternal Uncle   . Ovarian cancer Sister 34    "pre-cancer"; s/p BSO  . Cervical cancer Sister   . Basal cell carcinoma Sister   . Colon polyps Sister     unspecified number  . Breast cancer Cousin 102    s/p mastectomy  . Ovarian cancer Cousin 85    s/p BSO  . Leukemia Cousin 35  . Cancer Cousin     dx carcinoid bowel tumor  . Other Cousin     reportedly negative "BRCA1/2 testing"  . Breast cancer Cousin 58    reportedly negative GT is 2014-2015  . CAD Maternal Grandmother     d. 90s  . Heart attack Maternal Grandfather 51  . Heart Problems Paternal Grandmother   . Heart Problems Paternal Grandfather   . CAD Brother     triple vessel CAD, d. 88  . Other Son     enlarged prostate w/ surveillance  . Thyroid cancer Maternal Uncle 71    unspecified type  . CAD Maternal Uncle     d. 51  . Other Cousin 48    dx benign "skin polyp"  . Breast cancer Other     paternal great aunt (PGM's sister)  . Breast cancer Other     paternal great aunt (PGM's sister)    GYNECOLOGIC HISTORY:  No LMP recorded. Patient is  postmenopausal. Menarche age 31, first live birth age 30. The patient is GX P4. She stopped having periods in 1989. She did not use hormone replacement. She used oral contraceptives less than one year.  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 Her husband [name] is retired from a [employer]. Son [name] lives in [place] and works as a Chief Strategy Officer. Son [name] lives in [place] and is with the [employer]. Son [name] this and cocoa [place] where he works as an Chief Financial Officer. Son [name] lives in [place] were he works as an Chief Financial Officer. The patient has 4 grandchildren including a 38-year-old they have custody of and lives with them. The patient attends our [employer]: [facility]   HEALTH MAINTENANCE: Social History  Substance Use Topics  .  Smoking status: Former Smoker    Years: 0.50    Types: Cigarettes    Quit date: 05/25/1974  . Smokeless tobacco: Never Used     Comment: was only a social/weekend smoker; smoked for 6 mos  . Alcohol use 0.5 oz/week    1 Standard drinks or equivalent per week     Comment: maybe 2-3 drinks per month     Colonoscopy:  PAP:  Bone density:   Allergies  Allergen Reactions  . Morphine And Related   . Vicodin [Hydrocodone-Acetaminophen]     No current facility-administered medications for this visit.    Current Outpatient Prescriptions  Medication Sig Dispense Refill  . acetaminophen (TYLENOL) 325 MG tablet Take 2 tablets (650 mg total) by mouth every 4 (four) hours as needed for mild pain (or Fever >/= 101). (Patient not taking: Reported on 02/18/2016)    . acetaminophen-codeine (TYLENOL #3) 300-30 MG tablet Take 1-2 tablets by mouth every 4 (four) hours as needed for moderate pain. 40 tablet 0  . aspirin 81 MG tablet Take 81 mg by mouth daily.    Marland Kitchen atorvastatin (LIPITOR) 40 MG tablet Take 40 mg by mouth daily.  5  . Cetirizine HCl 10 MG CAPS Take 10 mg by mouth.     . Cholecalciferol 1000 units capsule Take 2,000 Units by mouth daily.     . ciprofloxacin (CIPRO) 500 MG tablet Take 1 tablet (500 mg total) by mouth 2 (two) times daily. 10 tablet 0  . etodolac (LODINE) 400 MG tablet Take 400 mg by mouth 2 (two) times daily as needed for mild pain.   2  . FIBER COMPLETE PO Take by mouth daily.      Marland Kitchen LORazepam (ATIVAN) 0.5 MG tablet Take 1 tablet (0.5 mg total) by mouth every 8 (eight) hours as needed for anxiety. 30 tablet 0  . methylPREDNISolone (MEDROL DOSEPAK) 4 MG TBPK tablet Take as directed (Patient not taking: Reported on 02/18/2016) 21 tablet 0  . metroNIDAZOLE (METROGEL) 1 % gel Apply topically daily. 45 g 0  . Multiple Vitamin (MULTIVITAMIN) tablet Take 1 tablet by mouth daily.      . nitroGLYCERIN (NITROLINGUAL) 0.4 MG/SPRAY spray     . omeprazole (PRILOSEC) 20 MG capsule Take 20 mg by mouth daily.    . ondansetron (ZOFRAN) 8 MG tablet     . Probiotic Product (ALIGN PO) Take by mouth.    . prochlorperazine (COMPAZINE) 10 MG tablet     . Vitamins C E (CRANBERRY CONCENTRATE PO) Take by mouth daily.       Facility-Administered Medications Ordered in Other Visits  Medication Dose Route Frequency Provider Last Rate Last Dose  . sodium chloride flush (NS) 0.9 % injection 10 mL  10 mL Intracatheter PRN Chauncey Cruel, MD   10 mL at 12/27/15 1630    OBJECTIVE: Middle-aged white woman In no acute distress Vitals:   03/27/16 0954  BP: 114/71  Pulse: 84  Resp: 18  Temp: 98.2 F (36.8 C)     Body mass index is 25.65 kg/m.    ECOG FS:1 - Symptomatic but completely ambulatory  Sclerae unicteric, EOMs intact Oropharynx clear and moist No cervical or supraclavicular adenopathy Lungs no rales or rhonchi Heart regular rate and rhythm Abd soft, nontender, positive bowel sounds MSK no focal spinal tenderness, no upper extremity lymphedema Neuro: nonfocal, well oriented, appropriate affect Breasts: The right breast is unremarkable. I do not palpate  any suspicious mass in the left breast.  There are no skin or nipple changes of concern. Left axilla is benign.  LAB RESULTS:  CMP     Component Value Date/Time   NA 138 03/27/2016 0907   K 3.9 03/27/2016 0907   CO2 27 03/27/2016 0907   GLUCOSE 94 03/27/2016 0907   BUN 10.6 03/27/2016 0907   CREATININE 0.6 03/27/2016 0907   CALCIUM 8.9 03/27/2016 0907   PROT 7.0 03/27/2016 0907   ALBUMIN 3.8 03/27/2016 0907   AST 26 03/27/2016 0907   ALT 54 03/27/2016 0907   ALKPHOS 141 03/27/2016 0907   BILITOT 0.31 03/27/2016 0907    INo results found for: SPEP, UPEP  Lab Results  Component Value Date   WBC 6.6 03/27/2016   NEUTROABS 5.0 03/27/2016   HGB 10.5 (L) 03/27/2016   HCT 31.1 (L) 03/27/2016   MCV 91.0 03/27/2016   PLT 89 (L) 03/27/2016      Chemistry      Component Value Date/Time   NA 138 03/27/2016 0907   K 3.9 03/27/2016 0907   CO2 27 03/27/2016 0907   BUN 10.6 03/27/2016 0907   CREATININE 0.6 03/27/2016 0907      Component Value Date/Time   CALCIUM 8.9 03/27/2016 0907   ALKPHOS 141 03/27/2016 0907   AST 26 03/27/2016 0907   ALT 54 03/27/2016 0907   BILITOT 0.31 03/27/2016 0907       No results found for: LABCA2  No components found for: VVKPQ244  No results for input(s): INR in the last 168 hours.  Urinalysis No results found for: COLORURINE, APPEARANCEUR, LABSPEC, PHURINE, GLUCOSEU, HGBUR, BILIRUBINUR, KETONESUR, PROTEINUR, UROBILINOGEN, NITRITE, LEUKOCYTESUR   STUDIES: US Transvaginal Non-ob  Result Date: 03/26/2016 CLINICAL DATA: History of ovarian and breast cancer Ultrasound was provided for use by the ordering physician, and a technical charge was applied by the performing facility.  No radiologist interpretation/professional services rendered.     ELIGIBLE FOR AVAILABLE RESEARCH PROTOCOL: no  ASSESSMENT: 60 y.o. [place], [place] woman status post left breast upper outer quadrant biopsy 12/02/2015 for a clinical T1c pN0, stage IA  invasive  ductal carcinoma, grade 3, estrogen and progesterone receptor positive, with an MIB-1 of 15%, and HER-2 amplification  (a) biopsy of a 0.6 cm lower outer quadrant right breast mass scheduled for 12/26/2015  (1) neoadjuvant chemotherapy consisting of weekly paclitaxel 12 with trastuzumab and pertuzumab every 21 days 4 started 12/27/2015  (a) paclitaxel discontinued after 7 doses because of peripheral neuropathy  (b) received carboplatin/gemcitabine weekly 5 beginning 02/21/2016, last dose 03/27/2016  (c) cycle 3 of carboplatin/Gemzar delayed one week because of thrombocytopenia; carbo dose reduced 10%  (d) cycle 5 carboplatin dose reduced an additional 10% and gemcitabine reduced to 800 mg/m due to thrombocytopenia  (2) trastuzumab to be continued to total of one year (through July 2018)  (a) echo 03/27/2016 showed an ejection fraction in the 60-65 % range.  (3) definitive surgery scheduled for 04/21/2016: considering bilateral mastectomies  (4) adjuvant radiation to follow as appropriate  (5) anti-estrogens to follow at the completion of local treatment  (a) patient is considering bilateral salpingo-oophorectomy  (6) genetics testing 02/09/2016 through the 44-gene [facility] Custom Panel performed by [employer] [facility] [place] [place]) found no deleterious mutations in APC, ATM, AXIN2, BARD1, BMPR1A, BRCA1, BRCA2, BRIP1, CDH1, CDKN2A, CHEK2, DICER1, EPCAM, GREM1, KIT, MEN1, MLH1, MSH2, MSH6, MUTYH, NBN, NF1, NF2, PALB2, PDGFRA, PMS2, POLD1, POLE, PTEN, RAD50, RAD51C, RAD51D, SDHA, SDHB, SDHC, SDHD, SMAD4, SMARCA4, SMARE1, STK11, TP53, TSC1, TSC2, and VHL  (  a)   One variant of uncertain significance (VUS) was found in "c.1691C>G (p.Ala564Gly)" in one copy of the DICER1 gene   PLAN: Alexandra Mathis is completing her neoadjuvant chemotherapy today. Generally she has done well with her treatments, though she has had problems with diarrhea and constipation and we had to change her regimen because of  neuropathy, which persists, but is not progressive.  Normally at this point we would obtain an MRI to assess response. However she is planning on bilateral mastectomies and so she can proceed directly to surgery without further radiologic evaluation.  She will continue trastuzumab alone, given every 3 weeks, through June of next year. She would prefer to receive these treatments on Fridays to minimize time off work. That is being arranged.  She will see me in 6 weeks, by which time we should have the final results from her surgery. At this point I'm  hopeful that she will have had a complete pathologic response.  She knows to call for any problems that may develop before her next visit here.    :Chauncey Cruel, MD   03/29/2016 3:20 PM Medical Oncology and Hematology [facility] [place], [place] [address] Tel. [phone]    Fax. [phone] 100 cGy.

## 2016-03-28 LAB — ECHOCARDIOGRAM COMPLETE
HEIGHTINCHES: 67 in
WEIGHTICAEL: 2620.8 [oz_av]

## 2016-03-29 ENCOUNTER — Emergency Department (HOSPITAL_COMMUNITY)
Admission: EM | Admit: 2016-03-29 | Discharge: 2016-03-29 | Disposition: A | Discharge status: Home / Self Care | Payer: BC Managed Care – PPO | Attending: Emergency Medicine | Admitting: Emergency Medicine

## 2016-03-29 ENCOUNTER — Encounter (HOSPITAL_COMMUNITY): Payer: Self-pay | Admitting: Emergency Medicine

## 2016-03-29 DIAGNOSIS — Z853 Personal history of malignant neoplasm of breast: Secondary | ICD-10-CM | POA: Insufficient documentation

## 2016-03-29 DIAGNOSIS — Z87891 Personal history of nicotine dependence: Secondary | ICD-10-CM | POA: Diagnosis not present

## 2016-03-29 DIAGNOSIS — Z7982 Long term (current) use of aspirin: Secondary | ICD-10-CM | POA: Diagnosis not present

## 2016-03-29 DIAGNOSIS — R112 Nausea with vomiting, unspecified: Secondary | ICD-10-CM

## 2016-03-29 LAB — BASIC METABOLIC PANEL
ANION GAP: 7 (ref 5–15)
BUN: 12 mg/dL (ref 6–20)
CO2: 25 mmol/L (ref 22–32)
Calcium: 7.8 mg/dL — ABNORMAL LOW (ref 8.9–10.3)
Chloride: 103 mmol/L (ref 101–111)
Creatinine, Ser: 0.49 mg/dL (ref 0.44–1.00)
GFR calc Af Amer: >60 mL/min (ref >60)
GLUCOSE: 76 mg/dL (ref 65–99)
POTASSIUM: 3.2 mmol/L — AB (ref 3.5–5.1)
SODIUM: 135 mmol/L (ref 135–145)

## 2016-03-29 LAB — CBC WITH DIFFERENTIAL/PLATELET
BASOS PCT: 0 %
Basophils Absolute: 0 10*3/uL (ref 0.0–0.1)
EOS ABS: 0 10*3/uL (ref 0.0–0.7)
Eosinophils Relative: 0 %
HCT: 27 % — ABNORMAL LOW (ref 36.0–46.0)
Hemoglobin: 9.3 g/dL — ABNORMAL LOW (ref 12.0–15.0)
LYMPHS PCT: 3 %
Lymphs Abs: 0.8 10*3/uL (ref 0.7–4.0)
MCH: 31.5 pg (ref 26.0–34.0)
MCHC: 34.4 g/dL (ref 30.0–36.0)
MCV: 91.5 fL (ref 78.0–100.0)
Monocytes Absolute: 0.3 10*3/uL (ref 0.1–1.0)
Monocytes Relative: 1 %
NEUTROS PCT: 96 %
Neutro Abs: 25.3 10*3/uL — ABNORMAL HIGH (ref 1.7–7.7)
PLATELETS: 80 10*3/uL — AB (ref 150–400)
RBC: 2.95 MIL/uL — ABNORMAL LOW (ref 3.87–5.11)
RDW: 16.4 % — ABNORMAL HIGH (ref 11.5–15.5)
WBC MORPHOLOGY: INCREASED
WBC: 26.4 10*3/uL — ABNORMAL HIGH (ref 4.0–10.5)

## 2016-03-29 MED ORDER — HEPARIN SOD (PORK) LOCK FLUSH 100 UNIT/ML IV SOLN
500.0000 [IU] | Freq: Once | INTRAVENOUS | Status: AC
  Administered 2016-03-29: 500 [IU]
  Filled 2016-03-29: qty 5

## 2016-03-29 MED ORDER — PROMETHAZINE HCL 25 MG RE SUPP: 25.0000 mg | Freq: Four times a day (QID) | RECTAL | 0 refills | Status: DC | PRN

## 2016-03-29 MED ORDER — ACETAMINOPHEN 325 MG PO TABS
650.0000 mg | ORAL_TABLET | Freq: Once | ORAL | Status: AC
  Administered 2016-03-29: 650 mg via ORAL
  Filled 2016-03-29: qty 2

## 2016-03-29 MED ORDER — SODIUM CHLORIDE 0.9 % IV BOLUS (SEPSIS)
1000.0000 mL | Freq: Once | INTRAVENOUS | Status: AC
  Administered 2016-03-29: 1000 mL via INTRAVENOUS

## 2016-03-29 MED ORDER — POTASSIUM CHLORIDE CRYS ER 20 MEQ PO TBCR
40.0000 meq | EXTENDED_RELEASE_TABLET | Freq: Once | ORAL | Status: AC
  Administered 2016-03-29: 40 meq via ORAL
  Filled 2016-03-29: qty 2

## 2016-03-29 MED ORDER — PROMETHAZINE HCL 25 MG/ML IJ SOLN
25.0000 mg | Freq: Once | INTRAMUSCULAR | Status: AC
  Administered 2016-03-29: 25 mg via INTRAVENOUS
  Filled 2016-03-29: qty 1
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
## 2016-03-29 NOTE — ED Triage Notes (Signed)
Pt referred to ED by oncologist for acute GI sx x 48 hours. Pt is 48 hours post last treatment. Pt stated that she is also exposed to "stomach bug" from grandchild's daycare. Pt reports recent dx of  low platelet count and dehydration. This is her  first GI episode after 14 weeks of chemo. C/o NVD. Diarrhea responded to immodium . Pt is weak,but ambulatory.  Husband with pt

## 2016-03-29 NOTE — ED Triage Notes (Signed)
Pt has port . Took to compazine , unable to keep it down

## 2016-03-29 NOTE — ED Notes (Signed)
Pt able to tolerate PO fluilds and medication without vomiting.

## 2016-03-29 NOTE — Discharge Instructions (Signed)
Please take the Phenergan suppositories as directed for relief of your vomiting. Also, please continue to monitor your temperature and take OTC Tylenol for any fevers. Please follow up with Dr. Jana Hakim this week for reevaluation.

## 2016-03-29 NOTE — ED Notes (Signed)
PA made aware of temp

## 2016-03-29 NOTE — ED Provider Notes (Signed)
[facility] DEPT Provider Note   CSN: [id number] Arrival date & time: 03/29/16  1257     History   Chief Complaint Chief Complaint  Patient presents with  . Nausea     x 3 days  . Emesis  . Abdominal Pain    HPI Alexandra Mathis is a 60 y.o. female.  Patient is 60 yo F with PMH of stage 1 breast cancer, no chronic abdominal problems, presenting with chief complaint of persistent nausea and vomiting starting on Friday. She received her last dose of chemotherapy Friday, as well as Neulasta patch. She has experienced similar symptoms of N/V after chemotherapy, needing IV rehydration at the [facility]. Given that the [facility] was closed today, her oncologist, Dr. Lurline Del, recommended she come into ED. She received an infusion of Zofran on Friday, tried PO Comapazine yesterday (which normally resolves nausea), but was unable to keep it down. She has tolerated some PO fluids, but states she feels dehydrated. She also had an episode of diarrhea yesterday, which resolved after taking Imodium. She denies fevers, chills, focal abdominal pain, hematemesis, blood in stools, or dysuria. She takes care of her 62 year-old granddaughter, and states she  "may have caught a virus" from her granddaughter's day care.      Past Medical History:  Diagnosis Date  . Breast cancer ([place])   . Breast cancer of upper-outer quadrant of left female breast ([place]) 12/05/2015  . Chest pain   . Family history of premature CAD   . Fibroid   . HLD (hyperlipidemia)     Patient Active Problem List   Diagnosis Date Noted  . Genetic testing 02/02/2016  . Port catheter in place 01/10/2016  . Family history of breast cancer in female 01/10/2016  . Family history of ovarian cancer 01/10/2016  . Family history of colon cancer 01/10/2016  . Breast cancer of upper-outer quadrant of left female breast ([place]) 12/05/2015    Past Surgical History:  Procedure Laterality Date  . ANTERIOR  CRUCIATE LIGAMENT REPAIR     right knee  . CESAREAN SECTION     x 4, last was cesarean hysterectomy  . cesarean section/hysterectomy     done with last cesarean section  . GANGLION CYST EXCISION    . LAPAROSCOPIC ENDOMETRIOSIS FULGURATION    . PORTACATH PLACEMENT Right 12/25/2015   Procedure: INSERTION PORT-A-CATH WITH [place];  Surgeon: Fanny Skates, MD;  Location: [facility];  Service: General;  Laterality: Right;  . TONSILLECTOMY    . WISDOM TOOTH EXTRACTION      OB History    Gravida Para Term Preterm AB Living   _0 SAB TAB Ectopic Multiple Live Births   3               Home Medications    Prior to Admission medications   Medication Sig Start Date End Date Taking? Authorizing Provider  acetaminophen (TYLENOL) 325 MG tablet Take 2 tablets (650 mg total) by mouth every 4 (four) hours as needed for mild pain (or Fever >/= 101). Patient not taking: Reported on 02/18/2016 12/25/15   Earnstine Regal, PA-C  acetaminophen-codeine (TYLENOL #3) 300-30 MG tablet Take 1-2 tablets by mouth every 4 (four) hours as needed for moderate pain. 12/25/15   Earnstine Regal, PA-C  aspirin 81 MG tablet Take 81 mg by mouth daily.    Historical Provider, MD  atorvastatin (LIPITOR) 40 MG tablet Take 40 mg by  mouth daily. 10/11/15   Historical Provider, MD  Cetirizine HCl 10 MG CAPS Take 10 mg by mouth.    Historical Provider, MD  Cholecalciferol 1000 units capsule Take 2,000 Units by mouth daily.     Historical Provider, MD  ciprofloxacin (CIPRO) 500 MG tablet Take 1 tablet (500 mg total) by mouth 2 (two) times daily. 03/19/16   Chauncey Cruel, MD  etodolac (LODINE) 400 MG tablet Take 400 mg by mouth 2 (two) times daily as needed for mild pain.  11/20/15   Historical Provider, MD  FIBER COMPLETE PO Take by mouth daily.      Historical Provider, MD  LORazepam (ATIVAN) 0.5 MG tablet Take 1 tablet (0.5 mg total) by mouth every 8 (eight) hours as needed for anxiety. 01/03/16   Chauncey Cruel, MD    methylPREDNISolone (MEDROL DOSEPAK) 4 MG TBPK tablet Take as directed Patient not taking: Reported on 02/18/2016 01/05/16   Chauncey Cruel, MD  metroNIDAZOLE (METROGEL) 1 % gel Apply topically daily. 01/31/16   Chauncey Cruel, MD  Multiple Vitamin (MULTIVITAMIN) tablet Take 1 tablet by mouth daily.      Historical Provider, MD  nitroGLYCERIN (NITROLINGUAL) 0.4 MG/SPRAY spray  01/01/16   Historical Provider, MD  omeprazole (PRILOSEC) 20 MG capsule Take 20 mg by mouth daily.    Historical Provider, MD  ondansetron (ZOFRAN) 8 MG tablet  12/20/15   Historical Provider, MD  Probiotic Product (ALIGN PO) Take by mouth.    Historical Provider, MD  prochlorperazine (COMPAZINE) 10 MG tablet  12/20/15   Historical Provider, MD  Vitamins C E (CRANBERRY CONCENTRATE PO) Take by mouth daily.      Historical Provider, MD    Family History Family History  Problem Relation Age of Onset  . Other Mother     benign meningioma dx 29 s/p surgery; hx of hysterectomy for hemorrhaging  . Hypothyroidism Mother   . CAD Mother   . Colon cancer Father 71    w/ mets to lungs and liver; + chemo; d. 67  . Gout Father   . Arthritis Father   . Ovarian cancer Sister     early 28s; s/p USO  . Breast cancer Sister 12    s/p mastectomy and aromatase inhibitor  . Other Paternal Aunt 77    Primary peritoneal cancer; s/p surgery  . Cancer Paternal Uncle   . Ovarian cancer Sister 37    "pre-cancer"; s/p BSO  . Cervical cancer Sister   . Basal cell carcinoma Sister   . Colon polyps Sister     unspecified number  . Breast cancer Cousin 47    s/p mastectomy  . Ovarian cancer Cousin 70    s/p BSO  . Leukemia Cousin 19  . Cancer Cousin     dx carcinoid bowel tumor  . Other Cousin     reportedly negative "BRCA1/2 testing"  . Breast cancer Cousin 69    reportedly negative GT is 2014-2015  . CAD Maternal Grandmother     d. 90s  . Heart attack Maternal Grandfather 51  . Heart Problems Paternal Grandmother   . Heart  Problems Paternal Grandfather   . CAD Brother     triple vessel CAD, d. 88  . Other Son     enlarged prostate w/ surveillance  . Thyroid cancer Maternal Uncle 71    unspecified type  . CAD Maternal Uncle     d. 37  . Other Cousin 11    dx  benign "skin polyp"  . Breast cancer Other     paternal great aunt (PGM's sister)  . Breast cancer Other     paternal great aunt (PGM's sister)    Social History Social History  Substance Use Topics  . Smoking status: Former Smoker    Years: 0.50    Types: Cigarettes    Quit date: 05/25/1974  . Smokeless tobacco: Never Used     Comment: was only a social/weekend smoker; smoked for 6 mos  . Alcohol use 0.5 oz/week    1 Standard drinks or equivalent per week     Comment: maybe 2-3 drinks per month     Allergies   Morphine and related and Vicodin [hydrocodone-acetaminophen]   Review of Systems Review of Systems  Constitutional: Negative for chills and fever.  HENT: Negative for trouble swallowing.   Respiratory: Negative for cough and shortness of breath.   Cardiovascular: Negative for chest pain, palpitations and leg swelling.  Gastrointestinal: Positive for abdominal pain, diarrhea (since resolved), nausea and vomiting. Negative for blood in stool.  Genitourinary: Negative for dysuria, flank pain and hematuria.  Musculoskeletal: Negative for back pain, gait problem and neck pain.  Skin: Negative for color change and rash.  Neurological: Positive for weakness. Negative for dizziness, seizures, syncope, numbness and headaches.  Hematological: Negative for adenopathy.     Physical Exam Updated Vital Signs BP 101/61 (BP Location: Right Arm)   Pulse 79   Temp 100.3 F (37.9 C) (Oral)   Resp 20   Ht _0  (1.702 m)   Wt 73.5 kg   SpO2 96%   BMI 25.37 kg/m   Physical Exam  Constitutional:  Frail, pleasant female, appears uncomfortable but in no acute distress.  HENT:  Head: Normocephalic and atraumatic.  Mouth/Throat:  Oropharynx is clear and moist.  Eyes: Conjunctivae are normal.  Neck: Normal range of motion. Neck supple.  Cardiovascular: Normal rate, regular rhythm, normal heart sounds and intact distal pulses.   Pulmonary/Chest: Effort normal and breath sounds normal. No respiratory distress.  Abdominal: Soft. She exhibits no distension. There is tenderness (mild diffuse abdominal TTP). There is no guarding.  Hyperactive bowel sounds present.  Musculoskeletal: Normal range of motion. She exhibits no edema or tenderness.  Lymphadenopathy:    She has no cervical adenopathy.  Neurological: She is alert.  Skin: Skin is warm and dry.  Psychiatric: She has a normal mood and affect.  Nursing note and vitals reviewed.    ED Treatments / Results  Labs (all labs ordered are listed, but only abnormal results are displayed) Labs Reviewed  CBC WITH DIFFERENTIAL/PLATELET - Abnormal; Notable for the following:       Result Value   WBC 26.4 (*)    RBC 2.95 (*)    Hemoglobin 9.3 (*)    HCT 27.0 (*)    RDW 16.4 (*)    Platelets 80 (*)    Neutro Abs 25.3 (*)    All other components within normal limits  BASIC METABOLIC PANEL - Abnormal; Notable for the following:    Potassium 3.2 (*)    Calcium 7.8 (*)    All other components within normal limits    EKG  EKG Interpretation None       Radiology No results found.  Procedures Procedures (including critical care time)  Medications Ordered in ED Medications  sodium chloride 0.9 % bolus 1,000 mL (0 mLs Intravenous Stopped 03/29/16 1718)  promethazine (PHENERGAN) injection 25 mg (25 mg Intravenous Given 03/29/16  1448)  potassium chloride SA (K-DUR,KLOR-CON) CR tablet 40 mEq (40 mEq Oral Given 03/29/16 1652)  acetaminophen (TYLENOL) tablet 650 mg (650 mg Oral Given 03/29/16 1652)  heparin lock flush 100 unit/mL (500 Units Intracatheter Given 03/29/16 1742)     Initial Impression / Assessment and Plan / ED Course  I have reviewed the triage vital  signs and the nursing notes.  Pertinent labs & imaging results that were available during my care of the patient were reviewed by me and considered in my medical decision making (see chart for details).  Clinical Course    Patient is 60 yo F with PMH of stage 1 breast cancer, followed closely by Dr. Lurline Del, presenting with nausea and vomiting after receiving last dose of chemotherapy on Friday 11/3. Patient states she has needed IV rehydration after chemotherapy before, and was sent to ED by Dr. Jana Hakim for fluids and antiemetics. Initial vitals reassuring with temp 98.9 and HR 85. She was given 1 L NS and IV Phenergan. On reassessment, patient states she feels better and nausea resolved. CBC noted to have WBC 26.4, Hgb 9.3, and platelet count of 80. Also, noted to have K 3.2, likely due to episode of diarrhea. Personally called Dr. Jana Hakim to discuss lab values, who stated patient at baseline with leukocytosis due to Neulasta, and ok for d/c home if she feels better, with recommendation of prescription Phenergan suppositories. Given 40 meq PO Klor-Con to replete potassium and PO challenge, which she tolerated without vomiting. On reassessment, nursing noted rectal temperature of 100.3, and Tylenol ordered. Patient states she does not feel like she has a fever, and wants to go home as she is the primary caretaker of her granddaughter. Advised to continue IVF with repeat temperature prior to d/c. Temperature again noted to be 100.3. Patient again stated she wanted to go home. Discussed findings with attending physician, Dr. Davonna Belling, who stated patient is stable for d/c home with close f/u and strict return precautions. I had a long discussion with patient and her husband about closely monitoring her vitals and temperature, take OTC Tylenol as needed, and return to ED for any worsening fever, intractable vomiting, or any other concerning symptoms. Given prescription for Phnergan suppositories  and advised to f/u with Dr. Jana Hakim for reevaluation.  Vitals:   03/29/16 1459 03/29/16 1556 03/29/16 1608 03/29/16 1720  BP: 119/72 112/69  101/61  Pulse: 85 86  79  Resp: _0 Temp: 98.9 F (37.2 C) 99.6 F (37.6 C) 100.3 F (37.9 C) 100.3 F (37.9 C)  TempSrc: Oral Oral Rectal Oral  SpO2: 97% 96%  96%  Weight:  73.5 kg    Height:  _1  (1.702 m)       Final Clinical Impressions(s) / ED Diagnoses   Final diagnoses:  Non-intractable vomiting with nausea, unspecified vomiting type    New Prescriptions Discharge Medication List as of 03/29/2016  5:29 PM    START taking these medications   Details  promethazine (PHENERGAN) 25 MG suppository Place 1 suppository (25 mg total) rectally every 6 (six) hours as needed for nausea or vomiting., Starting Sun 03/29/2016, Print         Finnean Cerami F [place], PA 03/30/16 2222    Davonna Belling, MD 04/02/16 1525

## 2016-03-30 ENCOUNTER — Other Ambulatory Visit: Payer: Self-pay

## 2016-03-30 ENCOUNTER — Other Ambulatory Visit: Payer: Self-pay | Admitting: Nurse Practitioner

## 2016-03-30 ENCOUNTER — Telehealth: Payer: Self-pay

## 2016-03-30 ENCOUNTER — Encounter: Payer: Self-pay | Admitting: Obstetrics & Gynecology

## 2016-03-30 ENCOUNTER — Ambulatory Visit: Payer: BC Managed Care – PPO | Admitting: Nurse Practitioner

## 2016-03-30 DIAGNOSIS — C50412 Malignant neoplasm of upper-outer quadrant of left female breast: Secondary | ICD-10-CM

## 2016-03-30 NOTE — Telephone Encounter (Signed)
Pt in ED 11/5 with intractable nausea and vomiting.  Per Dr. Jana Hakim, I called pt this am to check on her.  Pt reports she is not feeling any better.  She is able to keep fluids down, post nasal drainage, non-productive cough, weak, urine dark yellow.  I attempted to bring patient in today for fluids but patient was unable to get here.    Pt called in pm requesting to be seen.  "feels like it is settling in my chest".  Appt made for labs and [facility].  Lab orders entered.

## 2016-03-30 NOTE — Telephone Encounter (Signed)
Left message for pt to come in tomorrow morning and obtain CXR prior to having labs and being seen in [facility].

## 2016-03-31 ENCOUNTER — Ambulatory Visit (HOSPITAL_BASED_OUTPATIENT_CLINIC_OR_DEPARTMENT_OTHER): Payer: BC Managed Care – PPO | Admitting: Nurse Practitioner

## 2016-03-31 ENCOUNTER — Other Ambulatory Visit (HOSPITAL_BASED_OUTPATIENT_CLINIC_OR_DEPARTMENT_OTHER): Payer: BC Managed Care – PPO

## 2016-03-31 ENCOUNTER — Encounter: Payer: Self-pay | Admitting: Nurse Practitioner

## 2016-03-31 ENCOUNTER — Ambulatory Visit (HOSPITAL_COMMUNITY)
Admission: RE | Admit: 2016-03-31 | Discharge: 2016-03-31 | Disposition: A | Discharge status: Home / Self Care | Payer: BC Managed Care – PPO | Source: Ambulatory Visit | Attending: Nurse Practitioner | Admitting: Nurse Practitioner

## 2016-03-31 VITALS — BP 119/70 | HR 85 | Temp 98.2°F | Resp 18 | Ht 67.0 in | Wt 165.0 lb

## 2016-03-31 DIAGNOSIS — C50412 Malignant neoplasm of upper-outer quadrant of left female breast: Secondary | ICD-10-CM | POA: Diagnosis not present

## 2016-03-31 DIAGNOSIS — J40 Bronchitis, not specified as acute or chronic: Secondary | ICD-10-CM

## 2016-03-31 LAB — CBC WITH DIFFERENTIAL/PLATELET
BASO%: 0.1 % (ref 0.0–2.0)
Basophils Absolute: 0 10*3/uL (ref 0.0–0.1)
EOS%: 0.1 % (ref 0.0–7.0)
Eosinophils Absolute: 0 10*3/uL (ref 0.0–0.5)
HCT: 29.2 % — ABNORMAL LOW (ref 34.8–46.6)
HEMOGLOBIN: 10 g/dL — AB (ref 11.6–15.9)
LYMPH#: 1.8 10*3/uL (ref 0.9–3.3)
LYMPH%: 6.3 % — ABNORMAL LOW (ref 14.0–49.7)
MCH: 31.2 pg (ref 25.1–34.0)
MCHC: 34.2 g/dL (ref 31.5–36.0)
MCV: 91 fL (ref 79.5–101.0)
MONO#: 0.5 10*3/uL (ref 0.1–0.9)
MONO%: 1.8 % (ref 0.0–14.0)
NEUT#: 25.7 10*3/uL — ABNORMAL HIGH (ref 1.5–6.5)
NEUT%: 91.7 % — ABNORMAL HIGH (ref 38.4–76.8)
NRBC: 0 % (ref 0–0)
Platelets: 112 10*3/uL — ABNORMAL LOW (ref 145–400)
RBC: 3.21 10*6/uL — AB (ref 3.70–5.45)
RDW: 16.4 % — AB (ref 11.2–14.5)
WBC: 28.1 10*3/uL — ABNORMAL HIGH (ref 3.9–10.3)

## 2016-03-31 LAB — MAGNESIUM: Magnesium: 1.5 mg/dl (ref 1.5–2.5)

## 2016-03-31 LAB — COMPREHENSIVE METABOLIC PANEL
ALBUMIN: 3.5 g/dL (ref 3.5–5.0)
ALK PHOS: 167 U/L — AB (ref 40–150)
ALT: 34 U/L (ref 0–55)
AST: 24 U/L (ref 5–34)
Anion Gap: 6 mEq/L (ref 3–11)
BUN: 11 mg/dL (ref 7.0–26.0)
CO2: 29 meq/L (ref 22–29)
Calcium: 8.7 mg/dL (ref 8.4–10.4)
Chloride: 104 mEq/L (ref 98–109)
Creatinine: 0.7 mg/dL (ref 0.6–1.1)
EGFR: >90 mL/min/{1.73_m2} (ref >90)
GLUCOSE: 95 mg/dL (ref 70–140)
POTASSIUM: 4.3 meq/L (ref 3.5–5.1)
SODIUM: 139 meq/L (ref 136–145)
Total Bilirubin: 0.33 mg/dL (ref 0.20–1.20)
Total Protein: 6.5 g/dL (ref 6.4–8.3)

## 2016-03-31 MED ORDER — ALBUTEROL SULFATE HFA 108 (90 BASE) MCG/ACT IN AERS: 1.0000 | INHALATION_SPRAY | Freq: Four times a day (QID) | RESPIRATORY_TRACT | 2 refills | Status: DC | PRN

## 2016-03-31 MED ORDER — LEVOFLOXACIN 500 MG PO TABS: 500.0000 mg | ORAL_TABLET | Freq: Every day | ORAL | 0 refills | Status: DC

## 2016-03-31 NOTE — Assessment & Plan Note (Signed)
Patient received her final cycle 3, day 8.  Carboplatin/gemcitabine neoadjuvant chemotherapy on 03/27/2016.  She is scheduled for a double mastectomy per Dr. Dalbert Batman on 04/21/2016.  She also continue to receive Herceptin on an every three-week basis as well.  Patient is scheduled to return on 04/10/2016 for labs and her next cycle of Herceptin.

## 2016-03-31 NOTE — Assessment & Plan Note (Signed)
Patient states that she has had nasal congestion and sinus drainage for the past several days.  She presents to the [facility] today with complaint of congested, chronic cough as well.  She states that the cough is nonproductive.  She states she has had a fever to maximum 100.3 within the past few days; but has been afebrile for the past 24 hours.  Patient states that both her husband and her other family members that live in her home, have all had colds and a congested cough as well.  Patient states she had a flu shot last week as well.  Also, patient states that she went to the emergency department over the weekend for nausea, vomiting, and dehydration.  She received IV fluid rehydration while at the emergency department; and has had no further GI symptoms whatsoever.  She has been able to drink fluids with no issues.  Exam today reveals patient has a congested cough but no acute respiratory distress.  Bilateral breath sounds essentially clear; with just a trace of wheeze bilaterally.  Chest x-ray obtained today revealed no acute findings.  Vital signs were stable and patient was afebrile today with O2 sat 100% on room air.  Patient most likely has a secondary infection resulting in bronchitis.  Patient will be prescribed Levaquin antibiotics and an albuterol inhaler to try at home.  She was also encouraged to go directly to the emergency department if she develops any worsening symptoms whatsoever.

## 2016-03-31 NOTE — Progress Notes (Signed)
SYMPTOM MANAGEMENT CLINIC    Chief Complaint: Bronchitis  HPI:  Alexandra Mathis 60 y.o. female diagnosed with breast cancer.  Patient just completed carboplatin/gemcitabine chemotherapy regimen.  Currently undergoing Herceptin infusions.  Is scheduled for double mastectomy on 04/21/2016.    No history exists.    Review of Systems  HENT: Positive for congestion and sore throat.   Respiratory: Positive for cough and wheezing.   All other systems reviewed and are negative.   Past Medical History:  Diagnosis Date  . Breast cancer ([place])   . Breast cancer of upper-outer quadrant of left female breast ([place]) 12/05/2015  . Chest pain   . Family history of premature CAD   . Fibroid   . HLD (hyperlipidemia)     Past Surgical History:  Procedure Laterality Date  . ANTERIOR CRUCIATE LIGAMENT REPAIR     right knee  . CESAREAN SECTION     x 4, last was cesarean hysterectomy  . cesarean section/hysterectomy     done with last cesarean section  . GANGLION CYST EXCISION    . LAPAROSCOPIC ENDOMETRIOSIS FULGURATION    . PORTACATH PLACEMENT Right 12/25/2015   Procedure: INSERTION PORT-A-CATH WITH [place];  Surgeon: Fanny Skates, MD;  Location: [facility];  Service: General;  Laterality: Right;  . TONSILLECTOMY    . WISDOM TOOTH EXTRACTION      has Breast cancer of upper-outer quadrant of left female breast ([place]); Port catheter in place; Family history of breast cancer in female; Family history of ovarian cancer; Family history of colon cancer; Genetic testing; and Bronchitis on her problem list.    is allergic to morphine and related and vicodin [hydrocodone-acetaminophen].    Medication List       Accurate as of 03/31/16  4:58 PM. Always use your most recent med list.          acetaminophen 325 MG tablet Commonly known as:  TYLENOL Take 2 tablets (650 mg total) by mouth every 4 (four) hours as needed for mild pain (or Fever >/= 101).   acetaminophen-codeine 300-30 MG  tablet Commonly known as:  TYLENOL #3 Take 1-2 tablets by mouth every 4 (four) hours as needed for moderate pain.   albuterol 108 (90 Base) MCG/ACT inhaler Commonly known as:  PROVENTIL HFA;VENTOLIN HFA Inhale 1-2 puffs into the lungs every 6 (six) hours as needed for wheezing or shortness of breath (wheeze).   ALIGN PO Take by mouth.   aspirin 81 MG tablet Take 81 mg by mouth daily.   atorvastatin 40 MG tablet Commonly known as:  LIPITOR Take 40 mg by mouth daily.   Cetirizine HCl 10 MG Caps Take 10 mg by mouth.   Cholecalciferol 1000 units capsule Take 2,000 Units by mouth daily.   ciprofloxacin 500 MG tablet Commonly known as:  CIPRO Take 1 tablet (500 mg total) by mouth 2 (two) times daily.   CRANBERRY CONCENTRATE PO Take by mouth daily.   etodolac 400 MG tablet Commonly known as:  LODINE Take 400 mg by mouth 2 (two) times daily as needed for mild pain.   FIBER COMPLETE PO Take by mouth daily.   levofloxacin 500 MG tablet Commonly known as:  LEVAQUIN Take 1 tablet (500 mg total) by mouth daily.   LORazepam 0.5 MG tablet Commonly known as:  ATIVAN Take 1 tablet (0.5 mg total) by mouth every 8 (eight) hours as needed for anxiety.   methylPREDNISolone 4 MG Tbpk tablet Commonly known as:  MEDROL DOSEPAK Take as  directed   metroNIDAZOLE 1 % gel Commonly known as:  METROGEL Apply topically daily.   multivitamin tablet Take 1 tablet by mouth daily.   nitroGLYCERIN 0.4 MG/SPRAY spray Commonly known as:  NITROLINGUAL   omeprazole 20 MG capsule Commonly known as:  PRILOSEC Take 20 mg by mouth daily.   ondansetron 8 MG tablet Commonly known as:  ZOFRAN   prochlorperazine 10 MG tablet Commonly known as:  COMPAZINE   promethazine 25 MG suppository Commonly known as:  PHENERGAN Place 1 suppository (25 mg total) rectally every 6 (six) hours as needed for nausea or vomiting.        PHYSICAL EXAMINATION  Oncology Vitals 03/31/2016 03/29/2016  Height  170 cm -  Weight 74.844 kg -  Weight (lbs) 165 lbs -  BMI (kg/m2) 25.84 kg/m2 -  Temp 98.2 100.3  Pulse 85 79  Resp 18 20  SpO2 100 96  BSA (m2) 1.88 m2 -   BP Readings from Last 2 Encounters:  03/31/16 119/70  03/29/16 101/61    Physical Exam  Constitutional: She is oriented to person, place, and time and well-developed, well-nourished, and in no distress.  HENT:  Head: Normocephalic and atraumatic.  Mouth/Throat: Oropharynx is clear and moist.  Mild nasal congestion; no facial tenderness with palpation.  Posterior oropharynx with mild erythema but no exudate.  Eyes: Conjunctivae and EOM are normal. Pupils are equal, round, and reactive to light. Right eye exhibits no discharge. Left eye exhibits no discharge. No scleral icterus.  Neck: Normal range of motion. Neck supple. No JVD present. No tracheal deviation present. No thyromegaly present.  Cardiovascular: Normal rate, regular rhythm, normal heart sounds and intact distal pulses.   Pulmonary/Chest: Effort normal. No respiratory distress. She has wheezes. She has no rales. She exhibits no tenderness.  Mild, congested cough with occasional wheezes only.  Abdominal: Soft. Bowel sounds are normal. She exhibits no distension and no mass. There is no tenderness. There is no rebound and no guarding.  Musculoskeletal: Normal range of motion. She exhibits no edema or tenderness.  Lymphadenopathy:    She has no cervical adenopathy.  Neurological: She is alert and oriented to person, place, and time. Gait normal.  Skin: Skin is warm and dry. No rash noted. No erythema. No pallor.  Psychiatric: Affect normal.  Nursing note and vitals reviewed.   LABORATORY DATA:. Appointment on 03/31/2016  Component Date Value Ref Range Status  . WBC 03/31/2016 28.1* 3.9 - 10.3 10e3/uL Final  . NEUT# 03/31/2016 25.7* 1.5 - 6.5 10e3/uL Final  . HGB 03/31/2016 10.0* 11.6 - 15.9 g/dL Final  . HCT 03/31/2016 29.2* 34.8 - 46.6 % Final  . Platelets  03/31/2016 112* 145 - 400 10e3/uL Final  . MCV 03/31/2016 91.0  79.5 - 101.0 fL Final  . MCH 03/31/2016 31.2  25.1 - 34.0 pg Final  . MCHC 03/31/2016 34.2  31.5 - 36.0 g/dL Final  . RBC 03/31/2016 3.21* 3.70 - 5.45 10e6/uL Final  . RDW 03/31/2016 16.4* 11.2 - 14.5 % Final  . lymph# 03/31/2016 1.8  0.9 - 3.3 10e3/uL Final  . MONO# 03/31/2016 0.5  0.1 - 0.9 10e3/uL Final  . Eosinophils Absolute 03/31/2016 0.0  0.0 - 0.5 10e3/uL Final  . Basophils Absolute 03/31/2016 0.0  0.0 - 0.1 10e3/uL Final  . NEUT% 03/31/2016 91.7* 38.4 - 76.8 % Final  . LYMPH% 03/31/2016 6.3* 14.0 - 49.7 % Final  . MONO% 03/31/2016 1.8  0.0 - 14.0 % Final  . EOS% 03/31/2016  0.1  0.0 - 7.0 % Final  . BASO% 03/31/2016 0.1  0.0 - 2.0 % Final  . nRBC 03/31/2016 0  0 - 0 % Final  . Sodium 03/31/2016 139  136 - 145 mEq/L Final  . Potassium 03/31/2016 4.3  3.5 - 5.1 mEq/L Final  . Chloride 03/31/2016 104  98 - 109 mEq/L Final  . CO2 03/31/2016 29  22 - 29 mEq/L Final  . Glucose 03/31/2016 95  70 - 140 mg/dl Final  . BUN 03/31/2016 11.0  7.0 - 26.0 mg/dL Final  . Creatinine 03/31/2016 0.7  0.6 - 1.1 mg/dL Final  . Total Bilirubin 03/31/2016 0.33  0.20 - 1.20 mg/dL Final  . Alkaline Phosphatase 03/31/2016 167* 40 - 150 U/L Final  . AST 03/31/2016 24  5 - 34 U/L Final  . ALT 03/31/2016 34  0 - 55 U/L Final  . Total Protein 03/31/2016 6.5  6.4 - 8.3 g/dL Final  . Albumin 03/31/2016 3.5  3.5 - 5.0 g/dL Final  . Calcium 03/31/2016 8.7  8.4 - 10.4 mg/dL Final  . Anion Gap 03/31/2016 6  3 - 11 mEq/L Final  . EGFR 03/31/2016 >90  >90 ml/min/1.73 m2 Final  . Magnesium 03/31/2016 1.5  1.5 - 2.5 mg/dl Final  Admission on 03/29/2016, Discharged on 03/29/2016  Component Date Value Ref Range Status  . WBC 03/29/2016 26.4* 4.0 - 10.5 K/uL Final  . RBC 03/29/2016 2.95* 3.87 - 5.11 MIL/uL Final  . Hemoglobin 03/29/2016 9.3* 12.0 - 15.0 g/dL Final  . HCT 03/29/2016 27.0* 36.0 - 46.0 % Final  . MCV 03/29/2016 91.5  78.0 - 100.0 fL  Final  . MCH 03/29/2016 31.5  26.0 - 34.0 pg Final  . MCHC 03/29/2016 34.4  30.0 - 36.0 g/dL Final  . RDW 03/29/2016 16.4* 11.5 - 15.5 % Final  . Platelets 03/29/2016 80* 150 - 400 K/uL Final   Comment: RESULT REPEATED AND VERIFIED SPECIMEN CHECKED FOR CLOTS   . Neutrophils Relative % 03/29/2016 96  % Final  . Lymphocytes Relative 03/29/2016 3  % Final  . Monocytes Relative 03/29/2016 1  % Final  . Eosinophils Relative 03/29/2016 0  % Final  . Basophils Relative 03/29/2016 0  % Final  . Neutro Abs 03/29/2016 25.3* 1.7 - 7.7 K/uL Final  . Lymphs Abs 03/29/2016 0.8  0.7 - 4.0 K/uL Final  . Monocytes Absolute 03/29/2016 0.3  0.1 - 1.0 K/uL Final  . Eosinophils Absolute 03/29/2016 0.0  0.0 - 0.7 K/uL Final  . Basophils Absolute 03/29/2016 0.0  0.0 - 0.1 K/uL Final  . WBC Morphology 03/29/2016 INCREASED BANDS (>20% BANDS)   Final  . Smear Review 03/29/2016 PLATELET COUNT CONFIRMED BY SMEAR   Final  . Sodium 03/29/2016 135  135 - 145 mmol/L Final  . Potassium 03/29/2016 3.2* 3.5 - 5.1 mmol/L Final  . Chloride 03/29/2016 103  101 - 111 mmol/L Final  . CO2 03/29/2016 25  22 - 32 mmol/L Final  . Glucose, Bld 03/29/2016 76  65 - 99 mg/dL Final  . BUN 03/29/2016 12  6 - 20 mg/dL Final  . Creatinine, Ser 03/29/2016 0.49  0.44 - 1.00 mg/dL Final  . Calcium 03/29/2016 7.8* 8.9 - 10.3 mg/dL Final  . GFR calc non Af Amer 03/29/2016 >60  >60 mL/min Final  . GFR calc Af Amer 03/29/2016 >60  >60 mL/min Final   Comment: (NOTE) The eGFR has been calculated using the CKD EPI equation. This calculation has not been  validated in all clinical situations. eGFR's persistently <60 mL/min signify possible Chronic Kidney Disease.   . Anion gap 03/29/2016 7  5 - 15 Final    RADIOGRAPHIC STUDIES: Dg Chest 2 View  Result Date: 03/31/2016 CLINICAL DATA:  Persistent intractable nausea and vomiting. Patient reports nonproductive cough, dark urine. History of breast malignancy. EXAM: CHEST  2 VIEW COMPARISON:   Portable chest x-ray of December 25, 2015 FINDINGS: The lungs are adequately inflated. There is no focal infiltrate. There is no pleural effusion. No pulmonary parenchymal nodules or masses are observed. The heart and pulmonary vascularity are normal. The mediastinum is normal in width. The Port-A-Cath tip projects over the midportion of the SVC. There is no acute bony abnormality. The bowel gas pattern in the upper abdomen is unremarkable. IMPRESSION: There is no active cardiopulmonary disease. Electronically Signed   By: David  Martinique M.D.   On: 03/31/2016 10:17    ASSESSMENT/PLAN:    Bronchitis Patient states that she has had nasal congestion and sinus drainage for the past several days.  She presents to the [facility] today with complaint of congested, chronic cough as well.  She states that the cough is nonproductive.  She states she has had a fever to maximum 100.3 within the past few days; but has been afebrile for the past 24 hours.  Patient states that both her husband and her other family members that live in her home, have all had colds and a congested cough as well.  Patient states she had a flu shot last week as well.  Also, patient states that she went to the emergency department over the weekend for nausea, vomiting, and dehydration.  She received IV fluid rehydration while at the emergency department; and has had no further GI symptoms whatsoever.  She has been able to drink fluids with no issues.  Exam today reveals patient has a congested cough but no acute respiratory distress.  Bilateral breath sounds essentially clear; with just a trace of wheeze bilaterally.  Chest x-ray obtained today revealed no acute findings.  Vital signs were stable and patient was afebrile today with O2 sat 100% on room air.  Patient most likely has a secondary infection resulting in bronchitis.  Patient will be prescribed Levaquin antibiotics and an albuterol inhaler to try at home.  She was also  encouraged to go directly to the emergency department if she develops any worsening symptoms whatsoever.  Breast cancer of upper-outer quadrant of left female breast [facility]) Patient received her final cycle 3, day 8.  Carboplatin/gemcitabine neoadjuvant chemotherapy on 03/27/2016.  She is scheduled for a double mastectomy per Dr. Dalbert Batman on 04/21/2016.  She also continue to receive Herceptin on an every three-week basis as well.  Patient is scheduled to return on 04/10/2016 for labs and her next cycle of Herceptin.   Patient stated understanding of all instructions; and was in agreement with this plan of care. The patient knows to call the clinic with any problems, questions or concerns.   Total time spent with patient was 25 minutes;  with greater than 75 percent of that time spent in face to face counseling regarding patient's symptoms,  and coordination of care and follow up.  Disclaimer:This dictation was prepared with Dragon/digital dictation along with [employer]. Any transcriptional errors that result from this process are unintentional.  Drue Second, NP 03/31/2016

## 2016-04-01 ENCOUNTER — Ambulatory Visit (HOSPITAL_COMMUNITY): Payer: BC Managed Care – PPO | Admitting: Internal Medicine

## 2016-04-01 ENCOUNTER — Telehealth: Payer: Self-pay | Admitting: *Deleted

## 2016-04-01 NOTE — Telephone Encounter (Signed)
Received VM from patient inquiring about the use of a nasal spray for nasal congestion.  Pt was seen in [facility] ysterday, 03/31/16 for URI s/s.  Disciussed with Selena Lesser, NP in [facility] and it is ok for patient to try either saline nasal spray or Flonase for relief of nasal congestion.  TCT -no answer but was able to leave message on her identified VM with above information.

## 2016-04-02 ENCOUNTER — Telehealth: Payer: Self-pay | Admitting: *Deleted

## 2016-04-02 NOTE — Telephone Encounter (Signed)
TCT patient to follow up on [facility] visit on 03/31/16.Marland Kitchen Spoke with patient. She sates she is feeling better-being treated with levaquin and albuterol inhaler for URI.  She is still coughing but the cough is looser and productive for clear secretions.  She is not as tired, only naps for a short time in the afternoon. She has used the flonase for nasal congestion and found that this has helped quite a bit and is able to breathe ans sleep at night.  Drinking good amount of fluids and denies fever/chills.  She states her husband has upper respiratory infection now and has seen his PCP.  No other concerns or questions. Appreciative of call back.

## 2016-04-03 ENCOUNTER — Ambulatory Visit: Payer: BC Managed Care – PPO

## 2016-04-08 ENCOUNTER — Ambulatory Visit (HOSPITAL_COMMUNITY)
Admission: RE | Admit: 2016-04-08 | Discharge: 2016-04-08 | Disposition: A | Discharge status: Home / Self Care | Payer: BC Managed Care – PPO | Source: Ambulatory Visit | Attending: Internal Medicine | Admitting: Internal Medicine

## 2016-04-08 VITALS — BP 102/64 | HR 109 | Wt 165.1 lb

## 2016-04-08 DIAGNOSIS — Z7982 Long term (current) use of aspirin: Secondary | ICD-10-CM | POA: Insufficient documentation

## 2016-04-08 DIAGNOSIS — Z79899 Other long term (current) drug therapy: Secondary | ICD-10-CM | POA: Insufficient documentation

## 2016-04-08 DIAGNOSIS — C50912 Malignant neoplasm of unspecified site of left female breast: Secondary | ICD-10-CM | POA: Insufficient documentation

## 2016-04-08 DIAGNOSIS — Z808 Family history of malignant neoplasm of other organs or systems: Secondary | ICD-10-CM | POA: Diagnosis not present

## 2016-04-08 DIAGNOSIS — R9431 Abnormal electrocardiogram [ECG] [EKG]: Secondary | ICD-10-CM | POA: Insufficient documentation

## 2016-04-08 DIAGNOSIS — Z8249 Family history of ischemic heart disease and other diseases of the circulatory system: Secondary | ICD-10-CM | POA: Diagnosis not present

## 2016-04-08 DIAGNOSIS — C50412 Malignant neoplasm of upper-outer quadrant of left female breast: Secondary | ICD-10-CM

## 2016-04-08 DIAGNOSIS — Z87891 Personal history of nicotine dependence: Secondary | ICD-10-CM | POA: Insufficient documentation

## 2016-04-08 DIAGNOSIS — Z170 Estrogen receptor positive status [ER+]: Secondary | ICD-10-CM | POA: Diagnosis not present

## 2016-04-08 NOTE — Addendum Note (Signed)
Encounter addended by: Kennieth Rad, RN on: 04/08/2016  4:35 PM<BR>    Actions taken: Order list changed, Diagnosis association updated, Sign clinical note

## 2016-04-08 NOTE — Patient Instructions (Signed)
Echo in 3 months with Follow up

## 2016-04-08 NOTE — Progress Notes (Signed)
CARDIO-ONCOLOGY CLINIC CONSULT NOTE  Referring Physician: Magrinat Cardiologist: Fletcher Anon   HPI:  Alexandra Mathis is a 60 y/o female with HL, strong family h/o CAD and left breast CA referred by Dr. Jana Hakim for enrollment into the [facility].   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 Biopsy of the left axillary lymph node was negative.  Breast CA: clinical T1c pN0, stage IA  invasive ductal carcinoma, grade 3, estrogen and progesterone receptor positive, with an MIB-1 of 15%, and HER-2 amplification              (1) neoadjuvant chemotherapy consisting of weekly paclitaxel 12 with trastuzumab and pertuzumab every 21 days 4 started 12/27/2015             (a) paclitaxel discontinued after 7 doses because of peripheral neuropathy             (b) received carboplatin/gemcitabine weekly 5 beginning 02/21/2016, last dose 03/27/2016             (c) cycle 3 of carboplatin/Gemzar delayed one week because of thrombocytopenia; carbo dose reduced 10%             (d) cycle 5 carboplatin dose reduced an additional 10% and gemcitabine reduced to 800 mg/m due to thrombocytopenia  (2) trastuzumab to be continued to total of one year (through July 2018)             (a) echo 03/27/2016 showed an ejection fraction in the 60-65 % range.   Presents for initial visit. Denies h/o known CAD but does have strong FHx CAD. Saw Dr. Fletcher Anon in past. Had regular stress testing which were normal. Was exercising regularly up to 14 weeks ago when she got diagnosed with breast CA. Last week developed N/V and weakness. Also had fever. Seen in oncology clinic last week with URI symptoms and fever to  100.3. WBC 28k. CXR ok. Started on Levaquin. Now feeling much better but still coughing. Has finished chemo now getting Herceptin every 3 weeks. Finishes Herceptin 8/18. Has bilateral mastectomy in 2 weeks.   Echo 03/27/16: EF 60-65% GLS -20%   Review of Systems: [y] = yes, '[ ]'  = no   General: Weight gain '[ ]' ; Weight loss '[ ]' ; Anorexia '[ ]' ; Fatigue [ y]; Fever '[ ]' ; Chills '[ ]' ; Weakness '[ ]'   Cardiac: Chest pain/pressure '[ ]' ; Resting SOB '[ ]' ; Exertional SOB '[ ]' ; Orthopnea '[ ]' ; Pedal Edema '[ ]' ; Palpitations '[ ]' ; Syncope '[ ]' ; Presyncope '[ ]' ; Paroxysmal nocturnal dyspnea'[ ]'   Pulmonary: Cough Blue.Reese ]; Wheezing'[ ]' ; Hemoptysis'[ ]' ; Sputum '[ ]' ; Snoring '[ ]'   GI: Vomiting'[ ]' ; Dysphagia'[ ]' ; Melena'[ ]' ; Hematochezia '[ ]' ; Heartburn'[ ]' ; Abdominal pain '[ ]' ; Constipation '[ ]' ; Diarrhea '[ ]' ; BRBPR '[ ]'   GU: Hematuria'[ ]' ; Dysuria '[ ]' ; Nocturia'[ ]'   Vascular: Pain in legs with walking '[ ]' ; Pain in feet with lying flat '[ ]' ; Non-healing sores '[ ]' ; Stroke '[ ]' ; TIA '[ ]' ; Slurred speech '[ ]' ;  Neuro: Headaches'[ ]' ; Vertigo'[ ]' ; Seizures'[ ]' ; Paresthesias'[ ]' ;Blurred vision '[ ]' ; Diplopia '[ ]' ; Vision changes '[ ]'   Ortho/Skin: Arthritis '[ ]' ; Joint pain '[ ]' ; Muscle pain '[ ]' ; Joint swelling '[ ]' ;  Back Pain '[ ]' ; Rash '[ ]'   Psych: Depression'[ ]' ; Anxiety'[ ]'   Heme: Bleeding problems '[ ]' ; Clotting disorders '[ ]' ; Anemia '[ ]'   Endocrine: Diabetes '[ ]' ; Thyroid dysfunction'[ ]'    Past Medical History:  Diagnosis Date  . Breast cancer ([place])   . Breast cancer of upper-outer quadrant of left female breast ([place]) 12/05/2015  . Chest pain   . Family history of premature CAD   . Fibroid   . HLD (hyperlipidemia)     Current Outpatient Prescriptions  Medication Sig Dispense Refill  . acetaminophen (TYLENOL) 325 MG tablet Take 2 tablets (650 mg total) by mouth every 4 (four) hours as needed for mild pain (or Fever >/= 101).    Marland Kitchen acetaminophen-codeine (TYLENOL #3) 300-30 MG tablet Take 1-2 tablets by mouth every 4 (four) hours as needed for moderate  pain. 40 tablet 0  . albuterol (PROVENTIL HFA;VENTOLIN HFA) 108 (90 Base) MCG/ACT inhaler Inhale 1-2 puffs into the lungs every 6 (six) hours as needed for wheezing or shortness of breath (wheeze). 1 Inhaler 2  . aspirin 81 MG tablet Take 81 mg by mouth daily.    Marland Kitchen atorvastatin (LIPITOR) 40 MG tablet Take 40 mg by mouth daily.  5  . Cetirizine HCl 10 MG CAPS Take 10 mg by mouth.    . Cholecalciferol 1000 units capsule Take 2,000 Units by mouth daily.     . ciprofloxacin (CIPRO) 500 MG tablet Take 1 tablet (500 mg total) by mouth 2 (two) times daily. 10 tablet 0  . etodolac (LODINE) 400 MG tablet Take 400 mg by mouth 2 (two) times daily as needed for mild pain.   2  . FIBER COMPLETE PO Take by mouth daily.      Marland Kitchen levofloxacin (LEVAQUIN) 500 MG tablet Take 1 tablet (500 mg total) by mouth daily. 10 tablet 0  . LORazepam (ATIVAN) 0.5 MG tablet Take 1 tablet (0.5 mg total) by mouth every 8 (eight) hours as needed for anxiety. 30 tablet 0  . metroNIDAZOLE (METROGEL) 1 % gel Apply topically daily. 45 g 0  . Multiple Vitamin (MULTIVITAMIN) tablet Take 1 tablet by mouth daily.      . nitroGLYCERIN (NITROLINGUAL) 0.4 MG/SPRAY spray     . ondansetron (ZOFRAN) 8 MG tablet     . Probiotic Product (ALIGN PO) Take by mouth.    . promethazine (PHENERGAN) 25 MG suppository Place 1 suppository (25 mg total) rectally every 6 (six) hours as needed for nausea or vomiting. 12 each 0  . Vitamins C E (CRANBERRY CONCENTRATE PO) Take by mouth daily.       No current facility-administered medications for this encounter.     Allergies  Allergen Reactions  . Morphine And Related   . Vicodin [Hydrocodone-Acetaminophen]       Social History   Social History  . Marital status: Married    Spouse name: N/A  . Number of children: 4  . Years of education: N/A   Occupational History  . teacher    Social History Main Topics  . Smoking status: Former Smoker    Years: 0.50    Types: Cigarettes    Quit date:  05/25/1974  . Smokeless tobacco: Never Used     Comment: was only a social/weekend smoker; smoked for 6 mos  . Alcohol use 0.5 oz/week    1 Standard drinks or equivalent per week     Comment: maybe 2-3 drinks per month  . Drug use: No  Comment: last used 25 yrs ago  . Sexual activity: Yes    Birth control/ protection: Surgical, Post-menopausal   Other Topics Concern  . Not on file   Social History Narrative  . No narrative on file      Family History  Problem Relation Age of Onset  . Other Mother     benign meningioma dx 36 s/p surgery; hx of hysterectomy for hemorrhaging  . Hypothyroidism Mother   . CAD Mother   . Colon cancer Father 46    w/ mets to lungs and liver; + chemo; d. 71  . Gout Father   . Arthritis Father   . Ovarian cancer Sister     early 54s; s/p USO  . Breast cancer Sister 85    s/p mastectomy and aromatase inhibitor  . Other Paternal Aunt 59    Primary peritoneal cancer; s/p surgery  . Cancer Paternal Uncle   . Ovarian cancer Sister 48    "pre-cancer"; s/p BSO  . Cervical cancer Sister   . Basal cell carcinoma Sister   . Colon polyps Sister     unspecified number  . Breast cancer Cousin 82    s/p mastectomy  . Ovarian cancer Cousin 38    s/p BSO  . Leukemia Cousin 19  . Cancer Cousin     dx carcinoid bowel tumor  . Other Cousin     reportedly negative "BRCA1/2 testing"  . Breast cancer Cousin 94    reportedly negative GT is 2014-2015  . CAD Maternal Grandmother     d. 90s  . Heart attack Maternal Grandfather 51  . Heart Problems Paternal Grandmother   . Heart Problems Paternal Grandfather   . CAD Brother     triple vessel CAD, d. 52  . Other Son     enlarged prostate w/ surveillance  . Thyroid cancer Maternal Uncle 71    unspecified type  . CAD Maternal Uncle     d. 94  . Other Cousin 48    dx benign "skin polyp"  . Breast cancer Other     paternal great aunt (PGM's sister)  . Breast cancer Other     paternal great aunt (PGM's  sister)    Vitals:   04/08/16 1526  BP: 102/64  Pulse: (!) 109  SpO2: 99%  Weight: 165 lb 1.9 oz (74.9 kg)    PHYSICAL EXAM: General:  Ok appearing. Wearing a mask. Hacking cough  HEENT: normal Neck: supple. no JVD. Carotids 2+ bilat; no bruits. No lymphadenopathy or thryomegaly appreciated. Cor: PMI nondisplaced. Tachy regular.. No rubs, gallops or murmurs. Lungs: clear Abdomen: soft, nontender, nondistended. No hepatosplenomegaly. No bruits or masses. Good bowel sounds. Extremities: no cyanosis, clubbing, rash, edema Neuro: alert & oriented x 3, cranial nerves grossly intact. moves all 4 extremities w/o difficulty. Affect pleasant.  ECG: NSR 97 non-specific T wave flattening  ASSESSMENT & PLAN: 1. Left breast CA   --clinical T1c pN0, stage IA  invasive ductal carcinoma, grade 3, estrogen and progesterone receptor positive, with an MIB-1 of 15%, and HER-2 amplification   --echo 11/17 EF 60-65%   --Explained incidence of Herceptin cardiotoxicity and role of Cardio-oncology clinic at length. Echo images reviewed personally. All parameters stable. Reviewed signs and symptoms of HF to look for. Continue Herceptin. Follow-up with echo in 3 months.  Jaide Hillenburg,MD 4:20 PM

## 2016-04-09 ENCOUNTER — Telehealth: Payer: Self-pay | Admitting: Emergency Medicine

## 2016-04-09 NOTE — Telephone Encounter (Signed)
Patient left voicemail stating that she has recently been treated for an URI and is improving, but is requesting a stronger decongestant. Patient states she is on her "4th bottle of Mucinex" States " I need something to dry up my nasal drainage".   Returned patient's call; left message for patient to cal this office for further.

## 2016-04-10 ENCOUNTER — Telehealth: Payer: Self-pay | Admitting: *Deleted

## 2016-04-10 ENCOUNTER — Other Ambulatory Visit (HOSPITAL_BASED_OUTPATIENT_CLINIC_OR_DEPARTMENT_OTHER): Payer: BC Managed Care – PPO

## 2016-04-10 ENCOUNTER — Ambulatory Visit: Payer: BC Managed Care – PPO

## 2016-04-10 ENCOUNTER — Ambulatory Visit (HOSPITAL_BASED_OUTPATIENT_CLINIC_OR_DEPARTMENT_OTHER): Payer: BC Managed Care – PPO

## 2016-04-10 VITALS — BP 115/74 | HR 90 | Temp 98.4°F | Resp 18

## 2016-04-10 DIAGNOSIS — C50412 Malignant neoplasm of upper-outer quadrant of left female breast: Secondary | ICD-10-CM

## 2016-04-10 DIAGNOSIS — Z5112 Encounter for antineoplastic immunotherapy: Secondary | ICD-10-CM

## 2016-04-10 DIAGNOSIS — C773 Secondary and unspecified malignant neoplasm of axilla and upper limb lymph nodes: Secondary | ICD-10-CM | POA: Diagnosis not present

## 2016-04-10 DIAGNOSIS — Z170 Estrogen receptor positive status [ER+]: Principal | ICD-10-CM

## 2016-04-10 LAB — COMPREHENSIVE METABOLIC PANEL
ALT: 23 U/L (ref 0–55)
ANION GAP: 9 meq/L (ref 3–11)
AST: 19 U/L (ref 5–34)
Albumin: 3.7 g/dL (ref 3.5–5.0)
Alkaline Phosphatase: 122 U/L (ref 40–150)
BILIRUBIN TOTAL: 0.5 mg/dL (ref 0.20–1.20)
BUN: 13.6 mg/dL (ref 7.0–26.0)
CHLORIDE: 105 meq/L (ref 98–109)
CO2: 26 meq/L (ref 22–29)
Calcium: 9.6 mg/dL (ref 8.4–10.4)
Creatinine: 0.7 mg/dL (ref 0.6–1.1)
GLUCOSE: 90 mg/dL (ref 70–140)
Potassium: 4.1 mEq/L (ref 3.5–5.1)
SODIUM: 140 meq/L (ref 136–145)
TOTAL PROTEIN: 7.2 g/dL (ref 6.4–8.3)

## 2016-04-10 LAB — CBC WITH DIFFERENTIAL/PLATELET
BASO%: 0.5 % (ref 0.0–2.0)
Basophils Absolute: 0 10*3/uL (ref 0.0–0.1)
EOS%: 0.8 % (ref 0.0–7.0)
Eosinophils Absolute: 0 10*3/uL (ref 0.0–0.5)
HCT: 30.9 % — ABNORMAL LOW (ref 34.8–46.6)
HGB: 10.3 g/dL — ABNORMAL LOW (ref 11.6–15.9)
LYMPH%: 26.9 % (ref 14.0–49.7)
MCH: 31.3 pg (ref 25.1–34.0)
MCHC: 33.4 g/dL (ref 31.5–36.0)
MCV: 93.8 fL (ref 79.5–101.0)
MONO#: 0.6 10*3/uL (ref 0.1–0.9)
MONO%: 13 % (ref 0.0–14.0)
NEUT%: 58.8 % (ref 38.4–76.8)
NEUTROS ABS: 2.6 10*3/uL (ref 1.5–6.5)
Platelets: 235 10*3/uL (ref 145–400)
RBC: 3.29 10*6/uL — AB (ref 3.70–5.45)
RDW: 19.1 % — ABNORMAL HIGH (ref 11.2–14.5)
WBC: 4.4 10*3/uL (ref 3.9–10.3)
lymph#: 1.2 10*3/uL (ref 0.9–3.3)

## 2016-04-10 MED ORDER — DIPHENHYDRAMINE HCL 25 MG PO CAPS: 25.0000 mg | ORAL_CAPSULE | Freq: Once | ORAL | Status: DC

## 2016-04-10 MED ORDER — HEPARIN SOD (PORK) LOCK FLUSH 100 UNIT/ML IV SOLN
500.0000 [IU] | Freq: Once | INTRAVENOUS | Status: AC | PRN
  Administered 2016-04-10: 500 [IU]
  Filled 2016-04-10: qty 5

## 2016-04-10 MED ORDER — ACETAMINOPHEN 325 MG PO TABS
650.0000 mg | ORAL_TABLET | Freq: Once | ORAL | Status: AC
  Administered 2016-04-10: 650 mg via ORAL

## 2016-04-10 MED ORDER — SODIUM CHLORIDE 0.9% FLUSH
10.0000 mL | INTRAVENOUS | Status: DC | PRN
  Administered 2016-04-10: 10 mL
  Filled 2016-04-10: qty 10

## 2016-04-10 MED ORDER — ACETAMINOPHEN 325 MG PO TABS
ORAL_TABLET | ORAL | Status: AC
  Filled 2016-04-10: qty 2

## 2016-04-10 MED ORDER — SODIUM CHLORIDE 0.9 % IV SOLN
Freq: Once | INTRAVENOUS | Status: AC
  Administered 2016-04-10: 08:00:00 via INTRAVENOUS

## 2016-04-10 MED ORDER — SODIUM CHLORIDE 0.9 % IV SOLN
6.0000 mg/kg | Freq: Once | INTRAVENOUS | Status: AC
  Administered 2016-04-10: 441 mg via INTRAVENOUS
  Filled 2016-04-10: qty 21

## 2016-04-10 NOTE — Patient Instructions (Signed)
[facility] Discharge Instructions for Patients Receiving Chemotherapy  Today you received the following chemotherapy agents:  Herceptin  To help prevent nausea and vomiting after your treatment, we encourage you to take your nausea medication as prescribed.   If you develop nausea and vomiting that is not controlled by your nausea medication, call the clinic.   BELOW ARE SYMPTOMS THAT SHOULD BE REPORTED IMMEDIATELY:  *FEVER GREATER THAN 100.5 F  *CHILLS WITH OR WITHOUT FEVER  NAUSEA AND VOMITING THAT IS NOT CONTROLLED WITH YOUR NAUSEA MEDICATION  *UNUSUAL SHORTNESS OF BREATH  *UNUSUAL BRUISING OR BLEEDING  TENDERNESS IN MOUTH AND THROAT WITH OR WITHOUT PRESENCE OF ULCERS  *URINARY PROBLEMS  *BOWEL PROBLEMS  UNUSUAL RASH Items with * indicate a potential emergency and should be followed up as soon as possible.  Feel free to call the clinic you have any questions or concerns. The clinic phone number is [phone].  Please show the CHEMO ALERT CARD at check-in to the Emergency Department and triage nurse.   

## 2016-04-10 NOTE — Telephone Encounter (Signed)
"  I need to know if there is a stronger decongestant that can be prescribed.  Using Mucinex liquid.  Need to get better before surgery in the next two weeks.  I'm probably not drinking enough.  My chest is clear, it's sinuses and cough from post nasal drip." Advised mucinex works better when full glass of water drank when medicine used and throughout the day.  Notified collaborative of this call.

## 2016-04-15 ENCOUNTER — Encounter (HOSPITAL_COMMUNITY)
Admission: RE | Admit: 2016-04-15 | Discharge: 2016-04-15 | Disposition: A | Discharge status: Home / Self Care | Payer: BC Managed Care – PPO | Source: Ambulatory Visit | Attending: General Surgery | Admitting: General Surgery

## 2016-04-15 ENCOUNTER — Encounter (HOSPITAL_COMMUNITY): Payer: Self-pay

## 2016-04-15 DIAGNOSIS — Z01818 Encounter for other preprocedural examination: Secondary | ICD-10-CM | POA: Insufficient documentation

## 2016-04-15 DIAGNOSIS — C50912 Malignant neoplasm of unspecified site of left female breast: Secondary | ICD-10-CM | POA: Diagnosis not present

## 2016-04-15 HISTORY — DX: Encounter for antineoplastic chemotherapy: Z51.11

## 2016-04-15 HISTORY — DX: Unspecified osteoarthritis, unspecified site: M19.90

## 2016-04-15 HISTORY — DX: Other specified postprocedural states: Z98.890

## 2016-04-15 HISTORY — DX: Other specified postprocedural states: R11.2

## 2016-04-15 HISTORY — DX: Pneumonia, unspecified organism: J18.9

## 2016-04-15 LAB — CBC WITH DIFFERENTIAL/PLATELET
Basophils Absolute: 0 10*3/uL (ref 0.0–0.1)
Basophils Relative: 1 %
EOS ABS: 0 10*3/uL (ref 0.0–0.7)
EOS PCT: 1 %
HCT: 31.8 % — ABNORMAL LOW (ref 36.0–46.0)
Hemoglobin: 10.6 g/dL — ABNORMAL LOW (ref 12.0–15.0)
LYMPHS ABS: 1.2 10*3/uL (ref 0.7–4.0)
LYMPHS PCT: 27 %
MCH: 31.4 pg (ref 26.0–34.0)
MCHC: 33.3 g/dL (ref 30.0–36.0)
MCV: 94.1 fL (ref 78.0–100.0)
MONO ABS: 0.7 10*3/uL (ref 0.1–1.0)
MONOS PCT: 14 %
Neutro Abs: 2.6 10*3/uL (ref 1.7–7.7)
Neutrophils Relative %: 57 %
PLATELETS: 333 10*3/uL (ref 150–400)
RBC: 3.38 MIL/uL — ABNORMAL LOW (ref 3.87–5.11)
RDW: 17.6 % — ABNORMAL HIGH (ref 11.5–15.5)
WBC: 4.6 10*3/uL (ref 4.0–10.5)

## 2016-04-15 LAB — COMPREHENSIVE METABOLIC PANEL
ALT: 26 U/L (ref 14–54)
ANION GAP: 6 (ref 5–15)
AST: 26 U/L (ref 15–41)
Albumin: 3.9 g/dL (ref 3.5–5.0)
Alkaline Phosphatase: 82 U/L (ref 38–126)
BUN: 10 mg/dL (ref 6–20)
CALCIUM: 9.5 mg/dL (ref 8.9–10.3)
CHLORIDE: 103 mmol/L (ref 101–111)
CO2: 31 mmol/L (ref 22–32)
CREATININE: 0.66 mg/dL (ref 0.44–1.00)
Glucose, Bld: 89 mg/dL (ref 65–99)
Potassium: 3.9 mmol/L (ref 3.5–5.1)
SODIUM: 140 mmol/L (ref 135–145)
Total Bilirubin: 0.6 mg/dL (ref 0.3–1.2)
Total Protein: 7 g/dL (ref 6.5–8.1)

## 2016-04-15 NOTE — Pre-Procedure Instructions (Signed)
    Alexandra Mathis  04/15/2016      [facility] [place] [address] [place] [address] Phone: [phone] Fax: [phone]  [id number] [place], [place] [address] [place] [address] Phone: [phone] Fax: [phone]    Your procedure is scheduled on tues. Nov. 28  Report to [facility] Admitting at 5:30A.M.  Call this number if you have problems the morning of surgery:  [phone]   Remember:  Do not eat food or drink liquids after midnight.  Take these medicines the morning of surgery with A SIP OF WATER: tylenol if needed, albuterol if needed-bring to hospital, nitroglycerine if needed,              Stop aspirin, advil, motrin, ibuprofen, aleve, BC Powders, Goody's, vitamins/herbal medicines.   Do not wear jewelry, make-up or nail polish.  Do not wear lotions, powders, or perfumes, or deoderant.  Do not shave 48 hours prior to surgery.  Men may shave face and neck.  Do not bring valuables to the hospital.  [facility] is not responsible for any belongings or valuables.  Contacts, dentures or bridgework may not be worn into surgery.  Leave your suitcase in the car.  After surgery it may be brought to your room.  For patients admitted to the hospital, discharge time will be determined by your treatment team.  Patients discharged the day of surgery will not be allowed to drive home.    Special instructions:  Review preparing for surgery  Please read over the following fact sheets that you were given. Pain Booklet and Coughing and Deep Breathing

## 2016-04-15 NOTE — Progress Notes (Signed)
PCP:Dr. Julieanne Manson @ [facility]  Cardiologist: Dr. Haroldine Laws Oncologist: Dr. Jana Hakim : pt. Taking Herceptin every 3 weeks, last dose 04/10/16.

## 2016-04-19 NOTE — H&P (Signed)
Alexandra Mathis Location: [facility] Patient #: [id number] DOB: [birth year] Married / Language: English / Race: White Female       History of Present Illness The patient is a 60 year old female who presents with breast cancer. This is a very pleasant 60 year old female who returns during neoadjuvant chemotherapy for discussion of future definitive surgery.  Her primary care provider is Benjamine Mola, [place] and [facility]. Her oncologist is Dr. Jana Hakim. Her radiation oncologist is Dr. Lisbeth Renshaw.  She was originally seen on December 11, 2015. She was asymptomatic. Screening mammogram showed a 4 cm area of microcalcifications in the upper outer left breast and the ultrasound showed a 1.8 cm solid mass central to this. This was felt to be a fairly large area considering the size of her breast and almost filled the upper outer quadrant. 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 A biopsy of left axillary lymph node was negative and was thought to be of borderline abnormality from ultrasound standpoint. The right breast looked normal. She developed a hematoma which is basically resolved. The patient did not want any further biopsies left breast and wanted a mastectomy.  Subsequent MRI showed a large area of extensive non-mass enhancement and centrally in the left breast pending at least 6 cm with concern for involvement of the left nipple. There was also a 6 mm enhancing mass in the lower outer quadrant of the right breast which was biopsied and showed benign fibrocystic changes.  Port-A-Cath was inserted on August 2. She has seen Dr. to mop up. She has been undergoing chemotherapy. She is slowly losing her hair but otherwise feels well. She has had genetic counseling and genetic testing and that is negative.  Past history is significant for 4 C-sections and abdominal hysterectomy without BSO.  Hyperlipidemia. Family history reveals 2 sisters and one cousin had ovarian cancer. One sister was diagnosed with breast cancer at age 53. She is a survivor. 2 paternal first cousins had breast cancer. Father had colon cancer. She is married and lives with her husband and [place]. Therefore children. Denies tobacco since college. 3 I'll call beverages a week. Teaches high school biology.  She has not completed her preoperative chemotherapy but thinks that will end on October 27. She's had to take some delays because of neuropathy.  She wants to plan her definitive surgery. She wants to have bilateral mastectomies. I told her that is very reasonable. She is aware that there is no survival advantage to prophylactic right mastectomy but will decrease recurrence and she does have a significant family history.  She will complete her chemotherapy on October 27 or there abouts She will schedule end-of- treatment breast MRI the first week in November  she will return to see me the first week in November after the MRI for final planning She will see Dr. Iran Planas on October 14 for a final preop discussion  We are tentatively going to schedule her for left skin sparing mastectomy, left axillary sentinel node biopsy, right prophylactic skin sparing mastectomy, immediate reconstruction. I discussed the techniques of mastectomy. Techniques of sentinel lymph node biopsy.  We will leave the port in since she will need further Herceptin-based chemotherapy. We'll remove the port next year when she completes that.  She understands all these issues. All of her questions are answered.   Allergies  Morphine Sulfate *ANALGESICS - OPIOID*  Medication History Aspirin (81MG  Tablet DR, Oral)  Active. Lipitor (20MG  Tablet, Oral) Active. Fiber Complete (Oral) Active. Metoprolol Tartrate (25MG  Tablet, Oral) Active. PriLOSEC (20MG  Capsule DR, Oral) Active. Multiple Vitamin (1 (one)  Oral) Active. Vitamin C (500MG  Tablet, Oral) Active. ZyrTEC Allergy (10MG  Tablet, Oral) Active. Nitroglycerin (0.4MG  Tab Sublingual, Sublingual) Active. Ondansetron HCl (8MG  Tablet, Oral) Active. Probiotic Product (Oral) Active. Medications Reconciled  Vitals  Weight: 162 lb Height: 67in Body Surface Area: 1.85 m Body Mass Index: 25.37 kg/m  Temp.: 98.71F(Temporal)  Pulse: 76 (Regular)  BP: 126/78 (Sitting, Left Arm, Standard)    Physical Exam  General Mental Status-Alert. General Appearance-Not in acute distress. Build & Nutrition-Well nourished. Posture-Normal posture. Gait-Normal.  Head and Neck Head-normocephalic, atraumatic with no lesions or palpable masses. Trachea-midline. Thyroid Gland Characteristics - normal size and consistency and no palpable nodules. Note: Slowly developing alopecia   Chest and Lung Exam Chest and lung exam reveals -on auscultation, normal breath sounds, no adventitious sounds and normal vocal resonance. Note: Port in right infraclavicular area well healed. Nontender.   Breast Note: Breasts are medium size. In the left breast upper outer quadrant there may be a vague area of increased density but no discrete mass. In the right breast in the lower outer quadrant there is a tiny hematoma from her previous MRI guided biopsy. There are some tiny palpable lymph nodes in the left axilla but these did not feel pathological. No palpable mass in the right axilla.   Cardiovascular Cardiovascular examination reveals -normal heart sounds, regular rate and rhythm with no murmurs and femoral artery auscultation bilaterally reveals normal pulses, no bruits, no thrills.  Neurologic Neurologic evaluation reveals -alert and oriented x 3 with no impairment of recent or remote memory, normal attention span and ability to concentrate, normal sensation and normal coordination.  Musculoskeletal Normal Exam -  Bilateral-Upper Extremity Strength Normal and Lower Extremity Strength Normal.    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  You seem to be doing well with your preoperative chemotherapy. Tentatively, your preoperative chemotherapy will be completed on or about October 27. you will need to have a bilateral breast MRI after that is done You will need to see Dr. Dalbert Batman shortly after the MRI is done for a final preoperative discussion  We will plan to schedule your definitive surgery in late November. We will go ahead with that scheduling now to be sure that we have time set aside.  Keep your appointment with Dr. Iran Planas on October 14 for preoperative counseling Be sure to get the MRI before you see Dr. Dalbert Batman in [place]  you state that you have decided to have bilateral mastectomies, and that is appropriate considering the extent of disease and your family history You are not a good candidate for nipple sparing mastectomy since the tumor appears to involve the nipple on your MRI As we discussed, we are going to tentatively schedule you for bilateral skin sparing mastectomy and left axillary sentinel lymph node biopsy with immediate reconstruction. We will try to do this in late November so you have recovered from the chemotherapy and so that we have had plenty of time to review the MRI. The Port-A-Cath will be left in so that you can complete your Herceptin chemotherapy. We will remove the Port-A-Cath next year.   BREAST MASS, RIGHT (N63) Impression: Biopsy shows benign fibrocystic change FAMILY HISTORY OF BREAST CANCER (Z80.3) FAMILY HISTORY OF OVARIAN CANCER (Z80.41) Impression: 2 sisters and  one cousin BREAST HEMATOMA (N64.89) HYPERLIPIDEMIA, MILD (E78.5) HISTORY OF 3 CESAREAN SECTIONS (Z87.59) HISTORY OF ABDOMINAL HYSTERECTOMY (Z90.710) Impression: She still has her ovaries. She is  discussing removal of her ovaries with her gynecologist, Dr. Sabra Heck.  [name]. Dalbert Batman, M.D., [facility] General and Minimally invasive Surgery Breast and Colorectal Surgery Office:   [phone] Pager:   [phone]

## 2016-04-20 ENCOUNTER — Telehealth: Payer: Self-pay | Admitting: Genetic Counselor

## 2016-04-20 NOTE — H&P (Signed)
Subjective:     Patient ID: Alexandra Mathis is a 60 y.o. female.  HPI  Plan for bilateral breast reconstruction immediate.   Presented following screening MMG showing left UOQ calcifications. Compression views of the left breast found pleomorphic calcifications measuring up to 4.0 cm. US demonstrated mass measuring 1.6 cm in the 2:00 position 2 cm from the nipple. An intramammary lymph node was noted with benign appearing. In the left axilla there was a lymph node with focally thickened cortex. Biopsy showed  IDC, ER/PR+, Her 2 +. Biopsy of the left axillary lymph node was negative. MRI demonstrated NME centrally in the left breast spans at least 6 cm with concern for involvement of the left nipple as well. There was a 6 mm enhancing mass in the lower outer quadrant of the right breast. MRI guided biopsy of this was fibrocystic changes.  Completed  neoadjuvant chemotherapy 11.3.17, course marked by onset neuropathy. No post treatment MRI obtained.  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   Current B cup, would prefer C as this was her size prior to childbirth.      Objective:   Physical Exam  Cardiovascular: Normal rate.  normal heart sounds Pulmonary/Chest: Effort normal. Clear to auscultation Abdominal: Soft.  Small amount redundant tissue without panniculus  Lymphadenopathy:    She has no axillary adenopathy.   Grade 1 ptosis   SN to nipple R 21.5 L 21.5 cm BW R 15 L 15 cm Nipple to IMF R 7 L 7 cm  Assessment:     Left breast ca UOQ Family history breast ca Neadjuvant chemotherapy    Plan:     Patient desires bilateral mastectomies. Plan for placement expanders at time mastectomies, possible acellular dermis. Reviewed use of ADM in breast reconstruction, cadaveric  source. Reviewed can be source of seroma or infection if it does not incorporate.  Reviewed incisions, drains, OR length, hospital stay and post procedure limitations, visits. Discussed process of expansion and implant based risks including rupture, MRI surveillance for silicone implants, infection requiring surgery or removal, contracture. Reviewed future surgery dependent on adjuvant treatments, though with chemotherapy up front, implant exchange likely 2-3 months post mastectomy. Additional risks including but not limited to bleeding, hematoma, seroma, damage to deeper structures, asymmetry, DVT/PE, cardiopulmonary complications reviewed.  Plan SSM; given her initial MRI findings, we do not recommend NSM . Reviewed any type mastectomy risks flap necrosis requiring additional surgery, will be asensate.  Reports she tolerated dilaudid and percocet in past. She has planned for 6 weeks off from work.   Irene Limbo, MD [facility] Plastic & Reconstructive Surgery [phone], pin [phone]

## 2016-04-20 NOTE — Telephone Encounter (Signed)
Alexandra Mathis called to get an emailed copy of her test result.  I have sent that to her, along with a copy of her pedigree.  Her sister will also be seen for genetic counseling soon.

## 2016-04-21 ENCOUNTER — Ambulatory Visit (HOSPITAL_COMMUNITY)
Admission: RE | Admit: 2016-04-21 | Discharge: 2016-04-21 | Disposition: A | Discharge status: Home / Self Care | Payer: BC Managed Care – PPO | Source: Ambulatory Visit | Attending: General Surgery | Admitting: General Surgery

## 2016-04-21 ENCOUNTER — Observation Stay (HOSPITAL_COMMUNITY)
Admission: RE | Admit: 2016-04-21 | Discharge: 2016-04-22 | Disposition: A | Discharge status: Home / Self Care | Payer: BC Managed Care – PPO | Source: Ambulatory Visit | Attending: Plastic Surgery | Admitting: Plastic Surgery

## 2016-04-21 ENCOUNTER — Ambulatory Visit (HOSPITAL_COMMUNITY): Payer: BC Managed Care – PPO | Admitting: Emergency Medicine

## 2016-04-21 ENCOUNTER — Encounter (HOSPITAL_COMMUNITY): Payer: Self-pay | Admitting: Certified Registered Nurse Anesthetist

## 2016-04-21 ENCOUNTER — Ambulatory Visit (HOSPITAL_COMMUNITY): Payer: BC Managed Care – PPO | Admitting: Certified Registered Nurse Anesthetist

## 2016-04-21 ENCOUNTER — Encounter (HOSPITAL_COMMUNITY)
Admission: RE | Disposition: A | Discharge status: Home / Self Care | Payer: Self-pay | Source: Ambulatory Visit | Attending: Plastic Surgery

## 2016-04-21 DIAGNOSIS — Z87891 Personal history of nicotine dependence: Secondary | ICD-10-CM | POA: Diagnosis not present

## 2016-04-21 DIAGNOSIS — Z8041 Family history of malignant neoplasm of ovary: Secondary | ICD-10-CM | POA: Insufficient documentation

## 2016-04-21 DIAGNOSIS — C50912 Malignant neoplasm of unspecified site of left female breast: Secondary | ICD-10-CM | POA: Diagnosis present

## 2016-04-21 DIAGNOSIS — E785 Hyperlipidemia, unspecified: Secondary | ICD-10-CM | POA: Diagnosis not present

## 2016-04-21 DIAGNOSIS — Z800 Family history of malignant neoplasm of digestive organs: Secondary | ICD-10-CM | POA: Diagnosis not present

## 2016-04-21 DIAGNOSIS — C50812 Malignant neoplasm of overlapping sites of left female breast: Secondary | ICD-10-CM

## 2016-04-21 DIAGNOSIS — Z803 Family history of malignant neoplasm of breast: Secondary | ICD-10-CM | POA: Insufficient documentation

## 2016-04-21 DIAGNOSIS — M1990 Unspecified osteoarthritis, unspecified site: Secondary | ICD-10-CM | POA: Diagnosis not present

## 2016-04-21 DIAGNOSIS — Z885 Allergy status to narcotic agent status: Secondary | ICD-10-CM | POA: Insufficient documentation

## 2016-04-21 DIAGNOSIS — Z9221 Personal history of antineoplastic chemotherapy: Secondary | ICD-10-CM | POA: Diagnosis not present

## 2016-04-21 DIAGNOSIS — Z7982 Long term (current) use of aspirin: Secondary | ICD-10-CM | POA: Insufficient documentation

## 2016-04-21 DIAGNOSIS — G629 Polyneuropathy, unspecified: Secondary | ICD-10-CM | POA: Diagnosis not present

## 2016-04-21 DIAGNOSIS — Z90710 Acquired absence of both cervix and uterus: Secondary | ICD-10-CM | POA: Insufficient documentation

## 2016-04-21 DIAGNOSIS — C50412 Malignant neoplasm of upper-outer quadrant of left female breast: Secondary | ICD-10-CM | POA: Diagnosis present

## 2016-04-21 HISTORY — PX: MASTECTOMY: SHX3

## 2016-04-21 HISTORY — PX: BREAST RECONSTRUCTION WITH PLACEMENT OF TISSUE EXPANDER AND FLEX HD (ACELLULAR HYDRATED DERMIS): SHX6295

## 2016-04-21 HISTORY — PX: NIPPLE SPARING MASTECTOMY/SENTINAL LYMPH NODE BIOPSY/RECONSTRUCTION/PLACEMENT OF TISSUE EXPANDER: SHX6484

## 2016-04-21 HISTORY — DX: Anemia, unspecified: D64.9

## 2016-04-21 SURGERY — NIPPLE SPARING MASTECTOMY WITH SENTINAL LYMPH NODE BIOPSY AND  RECONSTRUCTION WITH PLACEMENT OF TISSUE EXPANDER
Anesthesia: General | Site: Breast | Laterality: Bilateral

## 2016-04-21 MED ORDER — TECHNETIUM TC 99M SULFUR COLLOID FILTERED
1.0000 | Freq: Once | INTRAVENOUS | Status: AC | PRN
  Administered 2016-04-21: 1 via INTRADERMAL

## 2016-04-21 MED ORDER — SODIUM CHLORIDE 0.9 % IJ SOLN
INTRAMUSCULAR | Status: AC
  Filled 2016-04-21: qty 10

## 2016-04-21 MED ORDER — SUGAMMADEX SODIUM 200 MG/2ML IV SOLN
INTRAVENOUS | Status: DC | PRN
  Administered 2016-04-21: 200 mg via INTRAVENOUS

## 2016-04-21 MED ORDER — MIDAZOLAM HCL 5 MG/5ML IJ SOLN
INTRAMUSCULAR | Status: DC | PRN
  Administered 2016-04-21 (×2): 1 mg via INTRAVENOUS

## 2016-04-21 MED ORDER — ACETAMINOPHEN 500 MG PO TABS
1000.0000 mg | ORAL_TABLET | ORAL | Status: AC
  Administered 2016-04-21: 1000 mg via ORAL
  Filled 2016-04-21: qty 2

## 2016-04-21 MED ORDER — ENOXAPARIN SODIUM 40 MG/0.4ML ~~LOC~~ SOLN
40.0000 mg | SUBCUTANEOUS | Status: DC
  Administered 2016-04-22: 40 mg via SUBCUTANEOUS
  Filled 2016-04-21: qty 0.4

## 2016-04-21 MED ORDER — KCL IN DEXTROSE-NACL 20-5-0.45 MEQ/L-%-% IV SOLN
INTRAVENOUS | Status: DC
  Administered 2016-04-21 – 2016-04-22 (×2): via INTRAVENOUS
  Filled 2016-04-21 (×2): qty 1000

## 2016-04-21 MED ORDER — ONDANSETRON 4 MG PO TBDP: 4.0000 mg | ORAL_TABLET | Freq: Four times a day (QID) | ORAL | Status: DC | PRN

## 2016-04-21 MED ORDER — ACETAMINOPHEN 325 MG PO TABS: 650.0000 mg | ORAL_TABLET | ORAL | Status: DC | PRN

## 2016-04-21 MED ORDER — PROPOFOL 10 MG/ML IV BOLUS
INTRAVENOUS | Status: AC
  Filled 2016-04-21: qty 20

## 2016-04-21 MED ORDER — GABAPENTIN 300 MG PO CAPS
300.0000 mg | ORAL_CAPSULE | ORAL | Status: AC
  Administered 2016-04-21: 300 mg via ORAL
  Filled 2016-04-21: qty 1

## 2016-04-21 MED ORDER — PROPOFOL 10 MG/ML IV BOLUS
INTRAVENOUS | Status: DC | PRN
  Administered 2016-04-21: 130 mg via INTRAVENOUS

## 2016-04-21 MED ORDER — CHLORHEXIDINE GLUCONATE CLOTH 2 % EX PADS: 6.0000 | MEDICATED_PAD | Freq: Once | CUTANEOUS | Status: DC

## 2016-04-21 MED ORDER — ONDANSETRON HCL 4 MG/2ML IJ SOLN: 4.0000 mg | Freq: Four times a day (QID) | INTRAMUSCULAR | Status: DC | PRN

## 2016-04-21 MED ORDER — ATORVASTATIN CALCIUM 40 MG PO TABS
40.0000 mg | ORAL_TABLET | Freq: Every day | ORAL | Status: DC
  Administered 2016-04-21: 40 mg via ORAL
  Filled 2016-04-21: qty 1

## 2016-04-21 MED ORDER — MIDAZOLAM HCL 2 MG/2ML IJ SOLN
INTRAMUSCULAR | Status: AC
  Filled 2016-04-21: qty 2

## 2016-04-21 MED ORDER — LIDOCAINE HCL (CARDIAC) 20 MG/ML IV SOLN
INTRAVENOUS | Status: DC | PRN
  Administered 2016-04-21: 60 mg via INTRAVENOUS

## 2016-04-21 MED ORDER — CEFAZOLIN SODIUM-DEXTROSE 2-4 GM/100ML-% IV SOLN
2.0000 g | INTRAVENOUS | Status: AC
  Administered 2016-04-21: 2 g via INTRAVENOUS
  Filled 2016-04-21: qty 100

## 2016-04-21 MED ORDER — SODIUM CHLORIDE 0.9 % IV SOLN
INTRAVENOUS | Status: DC | PRN
  Administered 2016-04-21: 1000 mL

## 2016-04-21 MED ORDER — CELECOXIB 200 MG PO CAPS
400.0000 mg | ORAL_CAPSULE | ORAL | Status: AC
Start: 2016-04-21 — End: 2016-04-21
  Administered 2016-04-21: 400 mg via ORAL
  Filled 2016-04-21: qty 2

## 2016-04-21 MED ORDER — METHYLENE BLUE 0.5 % INJ SOLN
INTRAVENOUS | Status: AC
  Filled 2016-04-21: qty 10

## 2016-04-21 MED ORDER — DEXAMETHASONE SODIUM PHOSPHATE 4 MG/ML IJ SOLN
INTRAMUSCULAR | Status: DC | PRN
  Administered 2016-04-21: 8 mg via INTRAVENOUS

## 2016-04-21 MED ORDER — NITROGLYCERIN 0.4 MG/SPRAY TL SOLN
1.0000 | Status: DC | PRN
  Filled 2016-04-21: qty 4.9

## 2016-04-21 MED ORDER — 0.9 % SODIUM CHLORIDE (POUR BTL) OPTIME
TOPICAL | Status: DC | PRN
  Administered 2016-04-21 (×3): 1000 mL

## 2016-04-21 MED ORDER — LACTATED RINGERS IV SOLN
INTRAVENOUS | Status: DC | PRN
  Administered 2016-04-21 (×3): via INTRAVENOUS

## 2016-04-21 MED ORDER — SCOPOLAMINE 1 MG/3DAYS TD PT72
MEDICATED_PATCH | TRANSDERMAL | Status: DC | PRN
  Administered 2016-04-21: 1.5 mg via TRANSDERMAL

## 2016-04-21 MED ORDER — ROCURONIUM BROMIDE 100 MG/10ML IV SOLN
INTRAVENOUS | Status: DC | PRN
  Administered 2016-04-21: 50 mg via INTRAVENOUS
  Administered 2016-04-21: 10 mg via INTRAVENOUS
  Administered 2016-04-21: 30 mg via INTRAVENOUS
  Administered 2016-04-21: 20 mg via INTRAVENOUS

## 2016-04-21 MED ORDER — FENTANYL CITRATE (PF) 100 MCG/2ML IJ SOLN
INTRAMUSCULAR | Status: AC
  Filled 2016-04-21: qty 4

## 2016-04-21 MED ORDER — ALBUTEROL SULFATE (2.5 MG/3ML) 0.083% IN NEBU: 2.5000 mg | INHALATION_SOLUTION | Freq: Four times a day (QID) | RESPIRATORY_TRACT | Status: DC | PRN

## 2016-04-21 MED ORDER — PHENYLEPHRINE HCL 10 MG/ML IJ SOLN
INTRAVENOUS | Status: DC | PRN
  Administered 2016-04-21: 10 ug/min via INTRAVENOUS

## 2016-04-21 MED ORDER — ONDANSETRON HCL 4 MG/2ML IJ SOLN
INTRAMUSCULAR | Status: DC | PRN
  Administered 2016-04-21: 4 mg via INTRAVENOUS

## 2016-04-21 MED ORDER — CEFAZOLIN SODIUM-DEXTROSE 2-4 GM/100ML-% IV SOLN
2.0000 g | Freq: Three times a day (TID) | INTRAVENOUS | Status: DC
  Administered 2016-04-21 – 2016-04-22 (×3): 2 g via INTRAVENOUS
  Filled 2016-04-21 (×4): qty 100

## 2016-04-21 MED ORDER — ALBUTEROL SULFATE HFA 108 (90 BASE) MCG/ACT IN AERS: 1.0000 | INHALATION_SPRAY | Freq: Four times a day (QID) | RESPIRATORY_TRACT | Status: DC | PRN

## 2016-04-21 MED ORDER — KETOROLAC TROMETHAMINE 30 MG/ML IJ SOLN
30.0000 mg | Freq: Three times a day (TID) | INTRAMUSCULAR | Status: AC
  Administered 2016-04-21 – 2016-04-22 (×3): 30 mg via INTRAVENOUS
  Filled 2016-04-21 (×3): qty 1

## 2016-04-21 MED ORDER — BUPIVACAINE HCL (PF) 0.5 % IJ SOLN
INTRAMUSCULAR | Status: DC | PRN
  Administered 2016-04-21: 30 mL

## 2016-04-21 MED ORDER — SODIUM CHLORIDE 0.9 % IV SOLN
INTRAVENOUS | Status: DC
  Filled 2016-04-21: qty 1

## 2016-04-21 MED ORDER — HYDROMORPHONE HCL 2 MG/ML IJ SOLN
0.5000 mg | INTRAMUSCULAR | Status: DC | PRN
  Administered 2016-04-22: 0.5 mg via INTRAVENOUS
  Filled 2016-04-21: qty 1

## 2016-04-21 MED ORDER — FENTANYL CITRATE (PF) 100 MCG/2ML IJ SOLN
INTRAMUSCULAR | Status: AC
  Filled 2016-04-21: qty 2

## 2016-04-21 MED ORDER — OXYCODONE HCL 5 MG PO TABS: 5.0000 mg | ORAL_TABLET | ORAL | Status: DC | PRN

## 2016-04-21 MED ORDER — FENTANYL CITRATE (PF) 100 MCG/2ML IJ SOLN
INTRAMUSCULAR | Status: DC | PRN
  Administered 2016-04-21: 100 ug via INTRAVENOUS
  Administered 2016-04-21 (×3): 50 ug via INTRAVENOUS

## 2016-04-21 MED ORDER — METHYLENE BLUE 0.5 % INJ SOLN
INTRAVENOUS | Status: DC | PRN
  Administered 2016-04-21: 5 mL via INTRAMUSCULAR

## 2016-04-21 MED ORDER — LORATADINE 10 MG PO TABS
10.0000 mg | ORAL_TABLET | Freq: Every day | ORAL | Status: DC
  Administered 2016-04-21: 10 mg via ORAL
  Filled 2016-04-21: qty 1

## 2016-04-21 MED ORDER — POVIDONE-IODINE 10 % EX SOLN
CUTANEOUS | Status: DC | PRN
  Administered 2016-04-21: 1 via TOPICAL

## 2016-04-21 MED ORDER — METHOCARBAMOL 500 MG PO TABS
500.0000 mg | ORAL_TABLET | Freq: Three times a day (TID) | ORAL | Status: DC | PRN
  Administered 2016-04-21: 500 mg via ORAL
  Filled 2016-04-21: qty 1

## 2016-04-21 MED ORDER — SCOPOLAMINE 1 MG/3DAYS TD PT72
MEDICATED_PATCH | TRANSDERMAL | Status: AC
  Filled 2016-04-21: qty 1

## 2016-04-21 SURGICAL SUPPLY — 73 items
ADH SKN CLS APL DERMABOND .7 (GAUZE/BANDAGES/DRESSINGS) ×2
ALLODERM 4X16 TRU-THICK (Tissue) ×4 IMPLANT
ALLODERM RTU 4X16 TRU-THICK (Tissue) ×2 IMPLANT
APPLIER CLIP 9.375 MED OPEN (MISCELLANEOUS) ×3
APR CLP MED 9.3 20 MLT OPN (MISCELLANEOUS) ×1
BAG DECANTER FOR FLEXI CONT (MISCELLANEOUS) ×3 IMPLANT
CANISTER SUCTION 2500CC (MISCELLANEOUS) ×4 IMPLANT
CHLORAPREP W/TINT 26ML (MISCELLANEOUS) ×4 IMPLANT
CLIP APPLIE 9.375 MED OPEN (MISCELLANEOUS) ×1 IMPLANT
CONT SPEC 4OZ CLIKSEAL STRL BL (MISCELLANEOUS) ×1 IMPLANT
COVER PROBE W GEL 5X96 (DRAPES) ×3 IMPLANT
COVER SURGICAL LIGHT HANDLE (MISCELLANEOUS) ×4 IMPLANT
DERMABOND ADVANCED (GAUZE/BANDAGES/DRESSINGS) ×4
DERMABOND ADVANCED .7 DNX12 (GAUZE/BANDAGES/DRESSINGS) ×3 IMPLANT
DRAIN CHANNEL 15F RND FF W/TCR (WOUND CARE) IMPLANT
DRAIN CHANNEL 19F RND (DRAIN) IMPLANT
DRAPE ORTHO SPLIT 77X108 STRL (DRAPES) ×6
DRAPE PROXIMA HALF (DRAPES) ×6 IMPLANT
DRAPE SURG ORHT 6 SPLT 77X108 (DRAPES) ×2 IMPLANT
DRAPE WARM FLUID 44X44 (DRAPE) ×3 IMPLANT
ELECT BLADE 4.0 EZ CLEAN MEGAD (MISCELLANEOUS) ×3
ELECT CAUTERY BLADE 6.4 (BLADE) ×4 IMPLANT
ELECT COATED BLADE 2.86 ST (ELECTRODE) ×3 IMPLANT
ELECT REM PT RETURN 9FT ADLT (ELECTROSURGICAL) ×3
ELECTRODE BLDE 4.0 EZ CLN MEGD (MISCELLANEOUS) ×2 IMPLANT
ELECTRODE REM PT RTRN 9FT ADLT (ELECTROSURGICAL) ×2 IMPLANT
EVACUATOR SILICONE 100CC (DRAIN) ×4 IMPLANT
EXPANDER TISSUE MX 400CC (Breast) IMPLANT
GLOVE BIO SURGEON STRL SZ 6 (GLOVE) ×9 IMPLANT
GLOVE BIO SURGEON STRL SZ7 (GLOVE) ×2 IMPLANT
GLOVE BIO SURGEON STRL SZ8 (GLOVE) ×2 IMPLANT
GLOVE BIOGEL PI IND STRL 6.5 (GLOVE) IMPLANT
GLOVE BIOGEL PI IND STRL 8.5 (GLOVE) IMPLANT
GLOVE BIOGEL PI INDICATOR 6.5 (GLOVE) ×8
GLOVE BIOGEL PI INDICATOR 8.5 (GLOVE) ×2
GLOVE EUDERMIC 7 POWDERFREE (GLOVE) ×3 IMPLANT
GLOVE INDICATOR 7.0 STRL GRN (GLOVE) ×2 IMPLANT
GLOVE SURG SS PI 6.5 STRL IVOR (GLOVE) ×4 IMPLANT
GOWN STRL REUS W/ TWL LRG LVL3 (GOWN DISPOSABLE) ×4 IMPLANT
GOWN STRL REUS W/ TWL XL LVL3 (GOWN DISPOSABLE) ×1 IMPLANT
GOWN STRL REUS W/TWL LRG LVL3 (GOWN DISPOSABLE) ×12
GOWN STRL REUS W/TWL XL LVL3 (GOWN DISPOSABLE) ×6
KIT BASIN OR (CUSTOM PROCEDURE TRAY) ×4 IMPLANT
KIT MARKER MARGIN INK (KITS) ×3 IMPLANT
KIT ROOM TURNOVER OR (KITS) ×4 IMPLANT
NDL 18GX1X1/2 (RX/OR ONLY) (NEEDLE) IMPLANT
NDL HYPO 25GX1X1/2 BEV (NEEDLE) IMPLANT
NEEDLE 18GX1X1/2 (RX/OR ONLY) (NEEDLE) ×3 IMPLANT
NEEDLE HYPO 25GX1X1/2 BEV (NEEDLE) ×3 IMPLANT
NS IRRIG 1000ML POUR BTL (IV SOLUTION) ×11 IMPLANT
PACK GENERAL/GYN (CUSTOM PROCEDURE TRAY) ×4 IMPLANT
PAD ARMBOARD 7.5X6 YLW CONV (MISCELLANEOUS) ×4 IMPLANT
PIN SAFETY STERILE (MISCELLANEOUS) ×3 IMPLANT
SET ASEPTIC TRANSFER (MISCELLANEOUS) ×3 IMPLANT
SOLUTION BETADINE 4OZ (MISCELLANEOUS) ×5 IMPLANT
SPECIMEN JAR X LARGE (MISCELLANEOUS) ×5 IMPLANT
SPONGE LAP 18X18 X RAY DECT (DISPOSABLE) ×8 IMPLANT
SUT ETHILON 2 0 FS 18 (SUTURE) ×4 IMPLANT
SUT MNCRL AB 4-0 PS2 18 (SUTURE) ×4 IMPLANT
SUT SILK 2 0 FS (SUTURE) ×2 IMPLANT
SUT VIC AB 0 CT1 27 (SUTURE) ×12
SUT VIC AB 0 CT1 27XBRD ANBCTR (SUTURE) IMPLANT
SUT VIC AB 3-0 SH 18 (SUTURE) ×3 IMPLANT
SUT VIC AB 3-0 SH 27 (SUTURE) ×18
SUT VIC AB 3-0 SH 27X BRD (SUTURE) IMPLANT
SUT VIC AB 4-0 PS2 27 (SUTURE) ×4 IMPLANT
SYR BULB IRRIGATION 50ML (SYRINGE) ×3 IMPLANT
SYR CONTROL 10ML LL (SYRINGE) ×2 IMPLANT
TISSUE ALLDRM RTU 4X16 TRU-TH (Tissue) IMPLANT
TISSUE EXPANDER MX 400CC (Breast) ×6 IMPLANT
TOWEL OR 17X24 6PK STRL BLUE (TOWEL DISPOSABLE) ×4 IMPLANT
TOWEL OR 17X26 10 PK STRL BLUE (TOWEL DISPOSABLE) ×4 IMPLANT
TRAY FOLEY CATH 16FRSI W/METER (SET/KITS/TRAYS/PACK) ×2 IMPLANT

## 2016-04-21 NOTE — Anesthesia Preprocedure Evaluation (Signed)
Anesthesia Evaluation  Patient identified by MRN, date of birth, ID band Patient awake    History of Anesthesia Complications (+) PONV  Airway Mallampati: II   Neck ROM: Full    Dental   Pulmonary former smoker,    breath sounds clear to auscultation       Cardiovascular  Rhythm:Regular Rate:Normal     Neuro/Psych negative neurological ROS     GI/Hepatic negative GI ROS, Neg liver ROS,   Endo/Other  negative endocrine ROS  Renal/GU      Musculoskeletal  (+) Arthritis ,   Abdominal   Peds  Hematology negative hematology ROS (+)   Anesthesia Other Findings   Reproductive/Obstetrics                             Anesthesia Physical Anesthesia Plan  ASA: II  Anesthesia Plan: General   Post-op Pain Management: GA combined w/ Regional for post-op pain   Induction: Intravenous  Airway Management Planned: Oral ETT  Additional Equipment:   Intra-op Plan:   Post-operative Plan: Extubation in OR  Informed Consent: I have reviewed the patients History and Physical, chart, labs and discussed the procedure including the risks, benefits and alternatives for the proposed anesthesia with the patient or authorized representative who has indicated his/her understanding and acceptance.   Dental advisory given  Plan Discussed with:   Anesthesia Plan Comments:         Anesthesia Quick Evaluation

## 2016-04-21 NOTE — Transfer of Care (Signed)
Immediate Anesthesia Transfer of Care Note  Patient: Alexandra Mathis  Procedure(s) Performed: Procedure(s): LEFT SKIN SPARING MASTECTOMY WITH LEFT SENTINAL LYMPH NODE BIOPSY, RIGHT PROPHYLATIC SKIN SPARING MASTECTOMY (Bilateral) BREAST RECONSTRUCTION WITH PLACEMENT OF TISSUE EXPANDER AND POSSIBLE CORTIVA (Bilateral)  Patient Location: PACU  Anesthesia Type:General  Level of Consciousness: awake, alert , oriented and patient cooperative  Airway & Oxygen Therapy: Patient Spontanous Breathing and Patient connected to nasal cannula oxygen  Post-op Assessment: Report given to RN and Post -op Vital signs reviewed and stable  Post vital signs: Reviewed and stable  Last Vitals:  Vitals:   04/21/16 0651 04/21/16 1225  BP: 134/70   Pulse: 72   Resp: 16   Temp: 36.8 C (P) 36.6 C    Last Pain:  Vitals:   04/21/16 1225  PainSc: (P) 0-No pain      Patients Stated Pain Goal: 3 ([id number])  Complications: No apparent anesthesia complications  General/Block

## 2016-04-21 NOTE — Care Management Note (Signed)
Case Management Note  Patient Details  Name: Alexandra Mathis MRN: [id number] Date of Birth: 1955/06/19  Subjective/Objective:                    Action/Plan:   Expected Discharge Date:                  Expected Discharge Plan:  Home/Self Care  In-House Referral:     Discharge planning Services     Post Acute Care Choice:    Choice offered to:     DME Arranged:    DME Agency:     HH Arranged:    [place] Agency:     Status of Service:  Completed, signed off  If discussed at [employer] of Stay Meetings, dates discussed:    Additional Comments:  Symora, [place], RN 04/21/2016, 1:42 PM

## 2016-04-21 NOTE — Op Note (Signed)
Operative Note   DATE OF OPERATION: 11.28.17  LOCATION:  Main OR-observation  SURGICAL DIVISION: Plastic Surgery  PREOPERATIVE DIAGNOSES:  1.  Left breast cancer 2. Neoadjuvant chemotherapy  POSTOPERATIVE DIAGNOSES:  same  PROCEDURE:  1. Bilateral breast reconstruction with tissue expanders 2. Acellular dermis (Alloderm) for breast reconstruction 100 cm2  SURGEON: Irene Limbo MD MBA  ASSISTANT: none  ANESTHESIA:  General.   EBL: 150 ml for entire case  COMPLICATIONS: None immediate.   INDICATIONS FOR PROCEDURE:  The patient, Alexandra Mathis, is a 60 y.o. female born on [birth year], is here for immediate expander based reconstruction following bilateral skin sparing mastectomies.   FINDINGS: Natrelle 133MX-2 T 400 ml tissue expanders placed bilateral, initial fill volume 200 ml. RIGHT SN [id number] LEFT SN [id number]  DESCRIPTION OF PROCEDURE:  The patient was marked to mark sternal notch, chest midline, anterior axillary lines and inframammary folds. The patient was taken to the operating room. SCDs were placed and IV antibiotics were given. The patient's operative site was prepped and draped in a sterile fashion. A time out was performed and all information was confirmed to be correct. Following completion mastectomies, reconstruction began on right side. The inferior insertions of pectoralis major muscle were divided with sternal insertions left intact. Submuscular dissection completed toward clavicle. Acellular dermis was perforated and sewn to inferior border of pectoralis major with running 3-0 vicryl. A 19 Fr drain was placed and secured to skin with 2-0 nylon. The cavity was irrigated with solution containing Ancef, genatmicin, and bacitracin. Hemostasis was ensured. The tissue expander was prepared and placed in submuscular position. The expander was secured to chest wall with a 3-0 vicryl. The inferior border of the acellular dermis was inset to chest wall and inframammary fold  fascia in a 3 point suture fashion.Laterally the acellular dermis was draped over the lateral border of expander and secured to serratus fascia. 0- vicryl suture was used to advance the posterior axillary line anteriorly and secured to serratus and acellular dermis. The axillary soft tissue of mastectomy flap was advanced inferiorly and secured to pectoralis muscle with 0-vicryl. The incision was closed with 3-0 vicryl in fascial layer and 4-0 vicryl in dermis. Skin closure completed with 4-0 monocryl subcuticular and tissue adhesive. Over right breast, similarly the inferior pectoralis insertions were divided. The acellular dermis was prepared and secured to inferior border of pectoralis with 3-0 vicryl. 19 Fr drain placed in cavity prior to placement of expander. The inferior borders of acellular dermis secured to chest wall and inframammary fold fascia to rest the IMF. Laterally the acellular dermis was again wrapped over the lateral border of expander and secured to serratus fascia with 3-0 vicryl. Closure completed in similar fashion.The ports were accessed and filled to 200 ml bilaterally. Tissue adhesive applied. Dry dressing and breast binder applied.  The patient was allowed to wake from anesthesia, extubated and taken to the recovery room in satisfactory condition.   SPECIMENS:  none  DRAINS: 19 Fr JP in right and left breast reconstruction  Irene Limbo, MD [facility] Plastic & Reconstructive Surgery [phone], pin [phone]

## 2016-04-21 NOTE — Anesthesia Procedure Notes (Signed)
Procedure Name: Intubation Date/Time: 04/21/2016 7:55 AM Performed by: Oletta Lamas Pre-anesthesia Checklist: Patient identified, Emergency Drugs available, Suction available and Patient being monitored Patient Re-evaluated:Patient Re-evaluated prior to inductionOxygen Delivery Method: Circle System Utilized Preoxygenation: Pre-oxygenation with 100% oxygen Intubation Type: IV induction Ventilation: Mask ventilation without difficulty Laryngoscope Size: Mac and 3 Tube type: Oral Tube size: 7.5 mm Number of attempts: 2 Airway Equipment and Method: Stylet and Oral airway Placement Confirmation: ETT inserted through vocal cords under direct vision,  positive ETCO2 and breath sounds checked- equal and bilateral Secured at: 22 cm Tube secured with: Tape Dental Injury: Teeth and Oropharynx as per pre-operative assessment

## 2016-04-21 NOTE — Interval H&P Note (Signed)
History and Physical Interval Note:  04/21/2016 7:05 AM  Alexandra Mathis  has presented today for surgery, with the diagnosis of LEFT BREAST CANCER  The various methods of treatment have been discussed with the patient and family. After consideration of risks, benefits and other options for treatment, the patient has consented to  Procedure(s): LEFT SKIN SPARING MASTECTOMY WITH LEFT SENTINAL LYMPH NODE BIOPSY, RIGHT PROPHYLATIC SKIN SPARING MASTECTOMY (Bilateral) BREAST RECONSTRUCTION WITH PLACEMENT OF TISSUE EXPANDER AND POSSIBLE CORTIVA (Bilateral) as a surgical intervention .  The patient's history has been reviewed, patient examined, no change in status, stable for surgery.  I have reviewed the patient's chart and labs.  Questions were answered to the patient's satisfaction.     [name]

## 2016-04-21 NOTE — Interval H&P Note (Signed)
History and Physical Interval Note:  04/21/2016 6:50 AM  Alexandra Mathis  has presented today for surgery, with the diagnosis of LEFT BREAST CANCER  The various methods of treatment have been discussed with the patient and family. After consideration of risks, benefits and other options for treatment, the patient has consented to  Procedure(s): LEFT SKIN SPARING MASTECTOMY WITH LEFT SENTINAL LYMPH NODE BIOPSY, RIGHT PROPHYLATIC SKIN SPARING MASTECTOMY (Bilateral) BREAST RECONSTRUCTION WITH PLACEMENT OF TISSUE EXPANDER AND POSSIBLE CORTIVA (Bilateral) as a surgical intervention .  The patient's history has been reviewed, patient examined, no change in status, stable for surgery.  I have reviewed the patient's chart and labs.  Questions were answered to the patient's satisfaction.     [name]

## 2016-04-21 NOTE — Op Note (Signed)
Patient Name:           Alexandra Mathis   Date of Surgery:        04/21/2016  Pre op Diagnosis:      Invasive ductal carcinoma left breast, triple positive breast cancer                                       Status post neoadjuvant chemotherapy                                       Family history breast cancer first degree relatives  Post op Diagnosis:    Same  Procedure:                 Inject blue dye left breast                                      Left total mastectomy                                      Left deep axillary sentinel node biopsy                                      Right prophylactic total mastectomy  Surgeon:                     [name]. Dalbert Batman, M.D., FACS  Assistant:                     Irene Limbo, M.D.   Indication for Assistant: Complex exposure for skin sparing mastectomy and deep lymph node dissection.  Surgeon assistant indicated to of reduce incidence of intraoperative and postoperative complications.  Operative Indications:  This is a very pleasant 60 year old female who returns during neoadjuvant chemotherapy for discussion of future definitive surgery.     Her primary care provider is Benjamine Mola, [place] and [facility]. Her oncologist is Dr. Jana Hakim. Her radiation oncologist is Dr. Lisbeth Renshaw.     She was originally seen on December 11, 2015. She was asymptomatic. Screening mammogram showed a 4 cm area of microcalcifications in the upper outer left breast and the ultrasound showed a 1.8 cm solid mass central to this. This was felt to be a fairly large area considering the size of her breast and almost filled the upper outer quadrant. 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 A biopsy of left axillary lymph node was negative and was thought to be of borderline abnormality from ultrasound standpoint. The right breast looked normal.  The patient did not want any further biopsies  left breast and wanted a mastectomy.      Subsequent MRI showed a large area of extensive non-mass enhancement and centrally in the left breast pending at least 6 cm with concern for involvement of the left nipple. There was also a 6 mm enhancing mass in the lower outer quadrant of the right breast which was biopsied and showed benign fibrocystic changes.  Port-A-Cath was inserted on August 2. She has seen Dr. Iran Planas.. She has completed her neoadjuvant chemotherapy She has had genetic counseling and genetic testing and that is negative.      Past history is significant for 4 C-sections and abdominal hysterectomy without BSO. Hyperlipidemia.      Family history reveals 2 sisters and one cousin had ovarian cancer. One sister was diagnosed with breast cancer at age 34. She is a survivor. 2 paternal first cousins had breast cancer. Father had colon cancer.     She wants to have bilateral mastectomies. I told her that is very reasonable. She is aware that there is no survival advantage to prophylactic right mastectomy but will decrease recurrence a little and she does have a significant family history.    We are tentatively going to schedule her for left skin sparing mastectomy, left axillary sentinel node biopsy, right prophylactic skin sparing mastectomy, immediate reconstruction. I discussed the techniques of mastectomy. Techniques of sentinel lymph node biopsy.      We will leave the port in since she will need further Herceptin-based chemotherapy. We'll remove the port next year when she completes that.    Operative Findings:       The patient underwent bilateral pectoral blocks by Dr. Orene Desanctis  in the holding area.  She underwent injection of technetium 99 radionuclide into the left breast by the nuclear medicine technician in the holding area.  We performed bilateral skin sparing mastectomies with short transverse elliptical incisions.  There was no gross cancer visible or  palpable, but there seemed to be a little bit of chemotherapy change in the breast tissues.  I found 2 sentinel lymph nodes in the left axilla both of which were hot and blue and that was all that we found.  Procedure in Detail:          Following the induction of general endotracheal anesthesia a Foley catheter was placed.  Intravenous antibiotics were given.  Surgical timeout was performed.    Following alcohol prep I injected 5 mL of dilute methylene blue into the left breast subareolar area and massaged the breast for a few minutes.     The entire anterior  neck, chest, axilla and upper abdomen were prepped and draped in a sterile fashion.  Marking pen was used to mark the midline and the anatomical boundaries of the breast and the short transverse elliptical incisions.  The technique of the skin sparing mastectomy was identical on both sides so I will dictate the technique just one time.  Bilateral transverse elliptical incisions were made.  Skin flaps were raised medially to the parasternal area, superiorly to the infraclavicular area, laterally to the anterior border of latissimus dorsi muscle, and inferiorly to the inframammary crease.  The Port-A-Cath on the right infraclavicular area was preserved.  The lateral skin margin was marked with a silk suture on each side to orient the pathologist.  On each side I dissected the breast tissues off of the pectoralis major and pectoralis minor muscle, carefully preserving the pectoralis fascia as much as possible.  The breast specimens were sent separately.  In the left axilla I used the neoprobe and dissected out two(2)  obvious sentinel lymph nodes which were hot and blue.  After these were removed there was no more radioactivity or obvious lymph nodes to dissected and so we completed the sentinel node dissection.     The wounds were copiously irrigated with saline.  Hemostasis was excellent and achieved with  electrocautery and a few metal clips.  Estimated  blood loss was 100 mL at this point in the case.  Counts correct.  Complications none.     At this point in case Dr. Iran Planas assumed control of the operative procedure and will dictate her reconstructive procedure separately.     [name]. Dalbert Batman, M.D., FACS General and Minimally Invasive Surgery Breast and Colorectal Surgery  04/21/2016 10:08 AM

## 2016-04-21 NOTE — Anesthesia Procedure Notes (Signed)
Anesthesia Procedure Note     

## 2016-04-21 NOTE — Anesthesia Procedure Notes (Addendum)
Anesthesia Regional Block:  Pectoralis block  Pre-Anesthetic Checklist: ,, timeout performed, Correct Patient, Correct Site, Correct Laterality, Correct Procedure, Correct Position, site marked, Risks and benefits discussed,  Surgical consent,  Pre-op evaluation,  At surgeon's request and post-op pain management  Laterality: Left, Right and Upper  Prep: chloraprep       Needles:      Needle Length: 9cm 9 cm Needle Gauge: 21 and 21 G  Needle insertion depth: 6 cm   Additional Needles:  Procedures: ultrasound guided (picture in chart) Pectoralis block Narrative:  Start time: 04/21/2016 7:00 AM End time: 04/21/2016 7:28 AM Injection made incrementally with aspirations every 5 mL.  Performed by: With CRNAs  Anesthesiologist: Melea Prezioso  Additional Notes: Bilateral PECblock

## 2016-04-21 NOTE — Progress Notes (Signed)
1330 Received pt from PACU, A&O x4. Bil chest incision with skin glue dry and intact, w/ ABD dressing dry and intact. JP drains in place and keeping suction. Pt denies pain at this time.

## 2016-04-22 ENCOUNTER — Encounter (HOSPITAL_COMMUNITY): Payer: Self-pay | Admitting: General Surgery

## 2016-04-22 DIAGNOSIS — C50412 Malignant neoplasm of upper-outer quadrant of left female breast: Secondary | ICD-10-CM | POA: Diagnosis not present

## 2016-04-22 MED ORDER — SULFAMETHOXAZOLE-TRIMETHOPRIM 800-160 MG PO TABS: 1.0000 | ORAL_TABLET | Freq: Two times a day (BID) | ORAL | 0 refills | Status: DC

## 2016-04-22 MED ORDER — OXYCODONE HCL 5 MG PO TABS: 5.0000 mg | ORAL_TABLET | ORAL | 0 refills | Status: DC | PRN

## 2016-04-22 MED ORDER — METHOCARBAMOL 500 MG PO TABS: 500.0000 mg | ORAL_TABLET | Freq: Three times a day (TID) | ORAL | 0 refills | Status: DC | PRN

## 2016-04-22 NOTE — Progress Notes (Signed)
   04/22/16 0900  Clinical Encounter Type  Visited With Patient not available    Chaplain attempted to visit with pt re: HCPA and Ad, pt resting at this time. Will follow up.

## 2016-04-22 NOTE — Anesthesia Postprocedure Evaluation (Signed)
Anesthesia Post Note  Patient: Alexandra Mathis  Procedure(s) Performed: Procedure(s) (LRB): LEFT SKIN SPARING MASTECTOMY WITH LEFT SENTINAL LYMPH NODE BIOPSY, RIGHT PROPHYLATIC SKIN SPARING MASTECTOMY (Bilateral) BREAST RECONSTRUCTION WITH PLACEMENT OF TISSUE EXPANDER AND ALLODERM (Bilateral)  Patient location during evaluation: PACU Anesthesia Type: General and Regional Level of consciousness: awake and alert Pain management: pain level controlled Vital Signs Assessment: post-procedure vital signs reviewed and stable Respiratory status: spontaneous breathing, nonlabored ventilation, respiratory function stable and patient connected to nasal cannula oxygen Cardiovascular status: blood pressure returned to baseline and stable Postop Assessment: no signs of nausea or vomiting Anesthetic complications: no    Last Vitals:  Vitals:   04/22/16 0225 04/22/16 0540  BP: 108/62 104/63  Pulse: 79 78  Resp: 16 16  Temp: 36.7 C 36.8 C    Last Pain:  Vitals:   04/22/16 0527  TempSrc:   PainSc: 3                  Sweta Halseth,JAMES TERRILL

## 2016-04-22 NOTE — Progress Notes (Signed)
1 Day Post-Op  Subjective: Doing well. Stable and alert. Pain control seems more than adequate.  Block is wearing off. Tolerating some diet without nausea Ambulating to bathroom and voiding without difficulty She is anticipating discharge home today  Objective: Vital signs in last 24 hours: Temp:  [97.5 F (36.4 C)-98.5 F (36.9 C)] 98.3 F (36.8 C) (11/29 0540) Pulse Rate:  [72-87] 78 (11/29 0540) Resp:  [15-17] 16 (11/29 0540) BP: (104-134)/(62-84) 104/63 (11/29 0540) SpO2:  [98 %-100 %] 99 % (11/29 0540) Weight:  [78.5 kg (173 lb 1 oz)] 78.5 kg (173 lb 1 oz) (11/28 1332) Last BM Date: 04/20/16  Intake/Output from previous day: 11/28 0701 - 11/29 0700 In: 4012.5 [P.O.:600; I.V.:3112.5; IV Piggyback:300] Out: 645 [Urine:85; Drains:410; Blood:150] Intake/Output this shift: Total I/O In: [id number] [P.O.:240; I.V.:1010; IV Piggyback:200] Out: 230 [Drains:230]  General appearance: Alert.  Very pleasant.  Lots of questions.  No distress. Resp: clear to auscultation bilaterally Chest wall: , Port site right infraclavicular area looks fine.  Bilateral mastectomy skin flaps are viable.  No skin necrosis.  No hematoma.  Bilateral drainage serosanguineous.  Lab Results:  No results found for this or any previous visit (from the past 24 hour(s)).   Studies/Results: No results found.  Marland Kitchen atorvastatin  40 mg Oral Daily  .  ceFAZolin (ANCEF) IV  2 g Intravenous Q8H  . enoxaparin (LOVENOX) injection  40 mg Subcutaneous Q24H  . loratadine  10 mg Oral Daily     Assessment/Plan: s/p Procedure(s): LEFT SKIN SPARING MASTECTOMY WITH LEFT SENTINAL LYMPH NODE BIOPSY, RIGHT PROPHYLATIC SKIN SPARING MASTECTOMY BREAST RECONSTRUCTION WITH PLACEMENT OF TISSUE EXPANDER AND ALLODERM  POD #1.  Bilateral total mastectomy, left axillary sentinel node biopsy, bilateral tissue expander based reconstruction Doing well Wound care, drain care, and discharge per Dr. Rebekah Chesterfield. Anticipate discharge  today  I told her I would call the pathology report to her on Thursday or Friday She has a scheduled appointment to see me in 2-3 weeks She plans to resume Herceptin monotherapy later on in December.  @PROBHOSP @  LOS: 0 days    Evaan Tidwell M 04/22/2016  . .prob

## 2016-04-22 NOTE — Discharge Summary (Signed)
Physician Discharge Summary  Patient ID: Alexandra Mathis MRN: [id number] DOB/AGE: 61-Sep-1957 61 y.o.  Admit date: 04/21/2016 Discharge date: 04/22/2016  Admission Diagnoses: Left breast cancer  Discharge Diagnoses:  Active Problems:   Breast cancer, left breast [facility])   Discharged Condition: stable  Hospital Course: Patient did well postoperatively and tolerated diet, ambulatory with minimal assist. She required IV pain medication as nerve block wore off but tolerated oral pain medication.  Treatments: surgery: bilateral skin sparing mastectomies, tissue expander acellular dermis reconstruction, left sentinel node  Discharge Exam: Blood pressure 104/63, pulse 78, temperature 98.3 F (36.8 C), resp. rate 16, weight 78.5 kg (173 lb 1 oz), SpO2 99 %. Incision/Wound: chest flat soft, drains serosanguinous incisions dry  Disposition: 01-Home or Self Care  Discharge Instructions    Call MD for:  redness, tenderness, or signs of infection (pain, swelling, bleeding, redness, odor or green/yellow discharge around incision site)    Complete by:  As directed    Call MD for:  temperature >100.5    Complete by:  As directed    Discharge instructions    Complete by:  As directed    Ok to remove dressings and shower am 11.30.17. Soap and water ok, pat incisions dry. No creams or ointments over incisions. Do not let drains dangle in shower, attach to lanyard or similar.Strip and record drains twice daily and bring log to clinic visit.  Breast binder or soft compression bra all other times.  Ok to raise arms above shoulders for bathing and dressing.  No house yard work or exercise until cleared by MD.   Recommend ibuprofen 400 mg with meals. Recommend Miralax as needed for constipation.  Ok to use Tylenol#3 instead of oxycodone if effective.   Driving Restrictions    Complete by:  As directed    No driving for 2 weeks at minimum, no driving if taking narcotics   Lifting restrictions     Complete by:  As directed    No lifting > 5 lbs   Resume previous diet    Complete by:  As directed        Medication List    STOP taking these medications   aspirin 81 MG tablet     TAKE these medications   acetaminophen 325 MG tablet Commonly known as:  TYLENOL Take 2 tablets (650 mg total) by mouth every 4 (four) hours as needed for mild pain (or Fever >/= 101).   acetaminophen-codeine 300-30 MG tablet Commonly known as:  TYLENOL #3 Take 1-2 tablets by mouth every 4 (four) hours as needed for moderate pain.   albuterol 108 (90 Base) MCG/ACT inhaler Commonly known as:  PROVENTIL HFA;VENTOLIN HFA Inhale 1-2 puffs into the lungs every 6 (six) hours as needed for wheezing or shortness of breath (wheeze).   ALIGN PO Take 1 tablet by mouth daily.   ascorbic acid 250 MG Chew Commonly known as:  VITAMIN C Chew 500 mg by mouth daily. In the winter months   atorvastatin 40 MG tablet Commonly known as:  LIPITOR Take 40 mg by mouth daily.   Cetirizine HCl 10 MG Caps Take 10 mg by mouth daily as needed. allergies   Cholecalciferol 1000 units capsule Take 2,000 Units by mouth daily.   CRANBERRY CONCENTRATE PO Take 1 tablet by mouth daily.   etodolac 400 MG tablet Commonly known as:  LODINE Take 400 mg by mouth 2 (two) times daily as needed for mild pain.   FIBER COMPLETE PO Take  5 tablets by mouth daily.   methocarbamol 500 MG tablet Commonly known as:  ROBAXIN Take 1 tablet (500 mg total) by mouth every 8 (eight) hours as needed for muscle spasms.   multivitamin tablet Take 1 tablet by mouth daily.   nitroGLYCERIN 0.4 MG/SPRAY spray Commonly known as:  NITROLINGUAL Place 1 spray under the tongue every 5 (five) minutes x 3 doses as needed.   oxyCODONE 5 MG immediate release tablet Commonly known as:  Oxy IR/ROXICODONE Take 1-2 tablets (5-10 mg total) by mouth every 4 (four) hours as needed for moderate pain.   sodium chloride 0.65 % Soln nasal spray Commonly  known as:  OCEAN Place 1 spray into both nostrils as needed for congestion.   sulfamethoxazole-trimethoprim 800-160 MG tablet Commonly known as:  BACTRIM DS,SEPTRA DS Take 1 tablet by mouth 2 (two) times daily.      Follow-up Information    Rodriguez Aguinaldo, MD Follow up in 1 week(s).   Specialty:  Plastic Surgery Why:  as scheduled Contact information: [address] [place] [address] [id number]        [name], MD. Schedule an appointment as soon as possible for a visit in 2 week(s).   Specialty:  General Surgery Contact information: [address] [place] [address] [phone]           Signed: Irene Limbo 04/22/2016, 7:25 AM

## 2016-04-22 NOTE — Progress Notes (Signed)
Discharge home. Home discharge instruction given, no questions verbalized. 

## 2016-05-01 ENCOUNTER — Encounter: Payer: Self-pay | Admitting: *Deleted

## 2016-05-01 ENCOUNTER — Other Ambulatory Visit (HOSPITAL_BASED_OUTPATIENT_CLINIC_OR_DEPARTMENT_OTHER): Payer: BC Managed Care – PPO

## 2016-05-01 ENCOUNTER — Ambulatory Visit: Payer: BC Managed Care – PPO

## 2016-05-01 ENCOUNTER — Ambulatory Visit (HOSPITAL_BASED_OUTPATIENT_CLINIC_OR_DEPARTMENT_OTHER): Payer: BC Managed Care – PPO

## 2016-05-01 ENCOUNTER — Ambulatory Visit (HOSPITAL_BASED_OUTPATIENT_CLINIC_OR_DEPARTMENT_OTHER): Payer: BC Managed Care – PPO | Admitting: Oncology

## 2016-05-01 VITALS — BP 129/71 | HR 73 | Temp 97.8°F | Resp 18 | Ht 67.0 in | Wt 173.5 lb

## 2016-05-01 DIAGNOSIS — Z170 Estrogen receptor positive status [ER+]: Principal | ICD-10-CM

## 2016-05-01 DIAGNOSIS — C50412 Malignant neoplasm of upper-outer quadrant of left female breast: Secondary | ICD-10-CM

## 2016-05-01 DIAGNOSIS — Z5112 Encounter for antineoplastic immunotherapy: Secondary | ICD-10-CM

## 2016-05-01 DIAGNOSIS — C773 Secondary and unspecified malignant neoplasm of axilla and upper limb lymph nodes: Secondary | ICD-10-CM

## 2016-05-01 DIAGNOSIS — Z95828 Presence of other vascular implants and grafts: Secondary | ICD-10-CM

## 2016-05-01 LAB — COMPREHENSIVE METABOLIC PANEL WITH GFR
ALT: 48 U/L (ref 0–55)
AST: 41 U/L — ABNORMAL HIGH (ref 5–34)
Albumin: 3.1 g/dL — ABNORMAL LOW (ref 3.5–5.0)
Alkaline Phosphatase: 68 U/L (ref 40–150)
Anion Gap: 7 meq/L (ref 3–11)
BUN: 10.4 mg/dL (ref 7.0–26.0)
CO2: 27 meq/L (ref 22–29)
Calcium: 8.6 mg/dL (ref 8.4–10.4)
Chloride: 108 meq/L (ref 98–109)
Creatinine: 0.7 mg/dL (ref 0.6–1.1)
EGFR: >90 ml/min/1.73 m2
Glucose: 87 mg/dL (ref 70–140)
Potassium: 3.9 meq/L (ref 3.5–5.1)
Sodium: 141 meq/L (ref 136–145)
Total Bilirubin: <0.22 mg/dL (ref 0.20–1.20)
Total Protein: 6.3 g/dL — ABNORMAL LOW (ref 6.4–8.3)

## 2016-05-01 LAB — CBC WITH DIFFERENTIAL/PLATELET
BASO%: 0.8 % (ref 0.0–2.0)
Basophils Absolute: 0 10*3/uL (ref 0.0–0.1)
EOS ABS: 0.1 10*3/uL (ref 0.0–0.5)
EOS%: 2.3 % (ref 0.0–7.0)
HEMATOCRIT: 30.6 % — AB (ref 34.8–46.6)
HGB: 10.2 g/dL — ABNORMAL LOW (ref 11.6–15.9)
LYMPH#: 1.1 10*3/uL (ref 0.9–3.3)
LYMPH%: 20.7 % (ref 14.0–49.7)
MCH: 31.2 pg (ref 25.1–34.0)
MCHC: 33.2 g/dL (ref 31.5–36.0)
MCV: 94 fL (ref 79.5–101.0)
MONO#: 0.5 10*3/uL (ref 0.1–0.9)
MONO%: 10 % (ref 0.0–14.0)
NEUT%: 66.2 % (ref 38.4–76.8)
NEUTROS ABS: 3.4 10*3/uL (ref 1.5–6.5)
PLATELETS: 187 10*3/uL (ref 145–400)
RBC: 3.26 10*6/uL — AB (ref 3.70–5.45)
RDW: 15.5 % — ABNORMAL HIGH (ref 11.2–14.5)
WBC: 5.1 10*3/uL (ref 3.9–10.3)

## 2016-05-01 MED ORDER — SODIUM CHLORIDE 0.9 % IV SOLN
6.0000 mg/kg | Freq: Once | INTRAVENOUS | Status: AC
  Administered 2016-05-01: 441 mg via INTRAVENOUS
  Filled 2016-05-01: qty 21

## 2016-05-01 MED ORDER — ACETAMINOPHEN 325 MG PO TABS
650.0000 mg | ORAL_TABLET | Freq: Once | ORAL | Status: AC
  Administered 2016-05-01: 650 mg via ORAL

## 2016-05-01 MED ORDER — SODIUM CHLORIDE 0.9% FLUSH
10.0000 mL | INTRAVENOUS | Status: DC | PRN
  Administered 2016-05-01: 10 mL
  Filled 2016-05-01: qty 10

## 2016-05-01 MED ORDER — ACETAMINOPHEN 325 MG PO TABS
ORAL_TABLET | ORAL | Status: AC
  Filled 2016-05-01: qty 2

## 2016-05-01 MED ORDER — SODIUM CHLORIDE 0.9 % IV SOLN
Freq: Once | INTRAVENOUS | Status: AC
  Administered 2016-05-01: 10:00:00 via INTRAVENOUS

## 2016-05-01 MED ORDER — DIPHENHYDRAMINE HCL 25 MG PO CAPS: 25.0000 mg | ORAL_CAPSULE | Freq: Once | ORAL | Status: DC

## 2016-05-01 MED ORDER — HEPARIN SOD (PORK) LOCK FLUSH 100 UNIT/ML IV SOLN
500.0000 [IU] | Freq: Once | INTRAVENOUS | Status: AC | PRN
  Administered 2016-05-01: 500 [IU]
  Filled 2016-05-01: qty 5

## 2016-05-01 MED ORDER — SODIUM CHLORIDE 0.9 % IJ SOLN
10.0000 mL | INTRAMUSCULAR | Status: DC | PRN
  Administered 2016-05-01: 10 mL via INTRAVENOUS
  Filled 2016-05-01: qty 10

## 2016-05-01 MED ORDER — DIPHENHYDRAMINE HCL 25 MG PO CAPS
ORAL_CAPSULE | ORAL | Status: AC
  Filled 2016-05-01: qty 2

## 2016-05-01 NOTE — Patient Instructions (Signed)

## 2016-05-01 NOTE — Progress Notes (Signed)
Inform patient of Pathology report,.    I believe Dr. Iran Planas discussed path report with her.  To be sure please call her today.  Cancer was only 1.1 cm.  Clean margins of resection. Nodes negative.  Contralateral (prophylactic) breast had no cancer. Basically very good news.  Let me know that you reached her.  Thanks, hmi

## 2016-05-01 NOTE — Progress Notes (Signed)
Alexandra Mathis is arrived as perhis arival she went to the tolerating she wasproposed thatshe didn't elaborate [place]  [phone] [phone]     ID: Alexandra Mathis DOB: December 31, 1955  MR#: [id number]  ZHY#:[id number]  Patient Care Team: Darrol Jump, PA-C as PCP - General (Family Medicine) Fanny Skates, MD as Consulting Physician (General Surgery) Chauncey Cruel, MD as Consulting Physician (Oncology) Kyung Rudd, MD as Consulting Physician (Radiation Oncology) Larey Dresser, MD as Consulting Physician (Cardiology) Irene Limbo, MD as Consulting Physician (Plastic Surgery) Megan Salon, MD as Consulting Physician (Gynecology) OTHER MD:  CHIEF COMPLAINT: Triple positive breast cancer  CURRENT TREATMENT: trastuzumab, anastrozole   BREAST CANCER HISTORY: From the original intake note:  "Alexandra Mathis" had screening mammography in June 2017 showing left breast upper outer quadrant calcifications. Accordingly she was referred for left diagnostic mammography and ultrasonography, performed 11/27/2015. The breast density was category C. Compression views of the left breast found pleomorphic calcifications in the upper outer quadrant measuring up to 4.0 cm. There was an area of associated architectural distortion in the upper outer quadrant as well. On physical exam there was an irregular thickening in the left breast at the 2:00 position anteriorly. Ultrasonography confirmed a hypoechoic irregular mass measuring 1.6 cm in the 2:00 position 2 cm from the nipple. An intramammary lymph node was noted with benign appearing. In the left axilla there was a lymph node with focally thickened cortex.  Biopsy of the left breast mass and left axillary lymph node 12/02/2015 found ([id number]), in the breast, invasive ductal carcinoma, grade 3, estrogen receptor 95% positive, progesterone receptor 15% positive, both with strong staining intensity, with an MIB-1 of 15%,  and HER-2 amplification, the signals ratio being 2.54 and the number per cell 6.10. Biopsy of the left axillary lymph node was negative.  Her subsequent history is as detailed below  INTERVAL HISTORY: Alexandra Mathis returns today for follow-up of her estrogen receptor positive breast cancer accompanied by her husband [name]. She continues on trastuzumab every 3 weeks with a dose due today. Her most recent echo, 03/27/2016, showed an ejection fraction in the 60-65% range  Since her last visit with me she underwent right prophylactic mastectomy and left skin sparing mastectomy with sentinel lymph node sampling, and immediate tissue expander placement on 04/21/2016. The pathology from this procedure ([id number]) showed, on the right, no evidence of malignancy. On the left there was a residual invasive ductal carcinoma measuring 1.1 cm, grade 2. Both sentinel lymph nodes were clear, and margins were ample. Repeat prognostic panel showed the tumor to be 95% estrogen receptor positive, 2% progesterone receptor positive, both with strong staining intensity, with repeat HER-2 again positive, the ratio being 2.97 and the number per cell 5.05.  REVIEW OF SYSTEMS: Alexandra Mathis did remarkably well with her surgery. She got on her feet as soon as the anesthesia wore off, and she started walking around and eating. She controls the pain with Tylenol No. 3 which she takes at bedtime chiefly because is difficult for her to sleep on her back. She has a very practical best to hold the reconstructed breasts in place--much better than the usual binder--and this allows for the drains, which are still in place. She had no problems with fever or bleeding. She is planning to get back to work the first week in January and hopefully have her definitive implants placed in February. A detailed review of systems today was otherwise stable  PAST MEDICAL HISTORY: Past  Medical History:  Diagnosis Date  . Anemia   . Arthritis    "mostly in my  hands; alot in my knees" (04/21/2016)  . Breast cancer of upper-outer quadrant of left female breast ([place]) 12/05/2015  . Chemotherapy management, encounter for 12/27/15 started  . Chest pain   . Family history of premature CAD   . Fibroid   . HLD (hyperlipidemia)   . Pneumonia 1996; 2006  . PONV (postoperative nausea and vomiting)     PAST SURGICAL HISTORY: Past Surgical History:  Procedure Laterality Date  . ABDOMINAL HYSTERECTOMY  1990   done with last cesarean section; "still have my ovaries"  . ANTERIOR CRUCIATE LIGAMENT REPAIR Right   . BREAST BIOPSY Left 2017  . BREAST BIOPSY Right 2017   "MRI guided; benign"  . BREAST RECONSTRUCTION WITH PLACEMENT OF TISSUE EXPANDER AND FLEX HD (ACELLULAR HYDRATED DERMIS) Bilateral 04/21/2016  . BREAST RECONSTRUCTION WITH PLACEMENT OF TISSUE EXPANDER AND FLEX HD (ACELLULAR HYDRATED DERMIS) Bilateral 04/21/2016   Procedure: BREAST RECONSTRUCTION WITH PLACEMENT OF TISSUE EXPANDER AND ALLODERM;  Surgeon: Irene Limbo, MD;  Location: [facility];  Service: Plastics;  Laterality: Bilateral;  . [place]; 1983; 1987; 1990  . CESAREAN SECTION     done with last cesarean section  . CYST EXCISION  2007   on abdomen- 2 cyst removed; "benign"  . DILATION AND CURETTAGE OF UTERUS  ~ 1980; 1989  . GANGLION CYST EXCISION Right ~ 1975  . LAPAROSCOPIC ENDOMETRIOSIS FULGURATION  1989   w/D&C  . MASTECTOMY Bilateral 04/21/2016   LEFT SKIN SPARING MASTECTOMY WITH LEFT SENTINAL LYMPH NODE BIOPSY, RIGHT PROPHYLATIC SKIN SPARING MASTECTOMY   . NIPPLE SPARING MASTECTOMY/SENTINAL LYMPH NODE BIOPSY/RECONSTRUCTION/PLACEMENT OF TISSUE EXPANDER Bilateral 04/21/2016   Procedure: LEFT SKIN SPARING MASTECTOMY WITH LEFT SENTINAL LYMPH NODE BIOPSY, RIGHT PROPHYLATIC SKIN SPARING MASTECTOMY;  Surgeon: Fanny Skates, MD;  Location: [facility];  Service: General;  Laterality: Bilateral;  . PORTACATH PLACEMENT Right 12/25/2015   Procedure: INSERTION PORT-A-CATH WITH [place];   Surgeon: Fanny Skates, MD;  Location: [facility];  Service: General;  Laterality: Right;  . TONSILLECTOMY    . WISDOM TOOTH EXTRACTION  1978    FAMILY HISTORY Family History  Problem Relation Age of Onset  . Other Mother     benign meningioma dx 66 s/p surgery; hx of hysterectomy for hemorrhaging  . Hypothyroidism Mother   . CAD Mother   . Colon cancer Father 5    w/ mets to lungs and liver; + chemo; d. 44  . Gout Father   . Arthritis Father   . Ovarian cancer Sister     early 85s; s/p USO  . Breast cancer Sister 39    s/p mastectomy and aromatase inhibitor  . Other Paternal Aunt 10    Primary peritoneal cancer; s/p surgery  . Cancer Paternal Uncle   . Ovarian cancer Sister 73    "pre-cancer"; s/p BSO  . Cervical cancer Sister   . Basal cell carcinoma Sister   . Colon polyps Sister     unspecified number  . Breast cancer Cousin 29    s/p mastectomy  . Ovarian cancer Cousin 12    s/p BSO  . Leukemia Cousin 44  . Cancer Cousin     dx carcinoid bowel tumor  . Other Cousin     reportedly negative "BRCA1/2 testing"  . Breast cancer Cousin 34    reportedly negative GT is 2014-2015  . CAD Maternal Grandmother  d. 90s  . Heart attack Maternal Grandfather 51  . Heart Problems Paternal Grandmother   . Heart Problems Paternal Grandfather   . CAD Brother     triple vessel CAD, d. 37  . Other Son     enlarged prostate w/ surveillance  . Thyroid cancer Maternal Uncle 71    unspecified type  . CAD Maternal Uncle     d. 28  . Other Cousin 48    dx benign "skin polyp"  . Breast cancer Other     paternal great aunt (PGM's sister)  . Breast cancer Other     paternal great aunt (PGM's sister)    GYNECOLOGIC HISTORY:  No LMP recorded. Patient has had a hysterectomy. Menarche age 23, first live birth age 89. The patient is GX P4. She stopped having periods in 1989. She did not use hormone replacement. She used oral contraceptives less than one year.  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 Her husband [name] is retired from a [employer]. Son [name] lives in [place] and works as a Chief Strategy Officer. Son [name] lives in [place] and is with the [employer]. Son [name] this and cocoa [place] where he works as an Chief Financial Officer. Son [name] lives in [place] were he works as an Chief Financial Officer. The patient has 4 grandchildren including a 69-year-old they have custody of and lives with them. The patient attends our [employer]: [facility] place   HEALTH MAINTENANCE: Social History  Substance Use Topics  . Smoking status: Former Smoker    Years: 0.25    Types: Cigarettes    Quit date: 05/25/1974  . Smokeless tobacco: Never Used     Comment: was only a social/weekend smoker; smoked for a couple months total  . Alcohol use 0.5 oz/week    1 Standard drinks or equivalent per week     Comment: 04/21/2016 "1 glass wine a few times/year"     Colonoscopy:  PAP:  Bone density:   Allergies  Allergen Reactions  . Morphine And Related Hives, Nausea And Vomiting and Rash  . Vicodin [Hydrocodone-Acetaminophen] Palpitations    Can tolerate tylenol #3    Current Outpatient Prescriptions  Medication Sig Dispense Refill  . acetaminophen-codeine (TYLENOL #3) 300-30 MG tablet Take 1-2 tablets by mouth every 4 (four) hours as needed for moderate pain. 40 tablet 0  . albuterol (PROVENTIL HFA;VENTOLIN HFA) 108 (90 Base) MCG/ACT inhaler Inhale 1-2 puffs into the lungs every 6 (six) hours as needed for wheezing or shortness of breath (wheeze). 1 Inhaler 2  . ascorbic acid (VITAMIN C) 250 MG CHEW Chew 500 mg by mouth daily. In the winter months    . atorvastatin (LIPITOR) 40 MG tablet Take 40 mg by mouth daily.  5  . Cetirizine HCl 10 MG CAPS Take 10 mg by mouth daily as needed. allergies    . Cholecalciferol 1000 units capsule Take 2,000 Units by mouth daily.     Marland Kitchen etodolac (LODINE) 400 MG tablet Take  400 mg by mouth 2 (two) times daily as needed for mild pain.   2  . FIBER COMPLETE PO Take 5 tablets by mouth daily.     . methocarbamol (ROBAXIN) 500 MG tablet Take 1 tablet (500 mg total) by mouth every 8 (eight) hours as needed for muscle spasms. 30 tablet 0  . Multiple Vitamin (MULTIVITAMIN) tablet Take 1 tablet by mouth daily.      Marland Kitchen  nitroGLYCERIN (NITROLINGUAL) 0.4 MG/SPRAY spray Place 1 spray under the tongue every 5 (five) minutes x 3 doses as needed.     Marland Kitchen oxyCODONE (OXY IR/ROXICODONE) 5 MG immediate release tablet Take 1-2 tablets (5-10 mg total) by mouth every 4 (four) hours as needed for moderate pain. 40 tablet 0  . Probiotic Product (ALIGN PO) Take 1 tablet by mouth daily.     . Vitamins C E (CRANBERRY CONCENTRATE PO) Take 1 tablet by mouth daily.      No current facility-administered medications for this visit.     OBJECTIVE: Middle-aged white woman Who appears well Vitals:   05/01/16 0903  BP: 129/71  Pulse: 73  Resp: 18  Temp: 97.8 F (36.6 C)     Body mass index is 27.17 kg/m.    ECOG FS:1 - Symptomatic but completely ambulatory  Sclerae unicteric, pupils round and equal Oropharynx clear and moist-- no thrush or other lesions No cervical or supraclavicular adenopathy Lungs no rales or rhonchi Heart regular rate and rhythm Abd soft, nontender, positive bowel sounds MSK no focal spinal tenderness, no upper extremity lymphedema Neuro: nonfocal, well oriented, appropriate affect Breasts: Status post bilateral mastectomies with bilateral implants in place. The initial cosmetic effect is symmetric. Drain still in place, with 15-20 mL of serosanguineous fluid in the bulb both axillae are benign.   LAB RESULTS:  CMP     Component Value Date/Time   NA 141 05/01/2016 0828   K 3.9 05/01/2016 0828   CL 103 04/15/2016 0942   CO2 27 05/01/2016 0828   GLUCOSE 87 05/01/2016 0828   BUN 10.4 05/01/2016 0828   CREATININE 0.7 05/01/2016 0828   CALCIUM 8.6 05/01/2016 0828     PROT 6.3 (L) 05/01/2016 0828   ALBUMIN 3.1 (L) 05/01/2016 0828   AST 41 (H) 05/01/2016 0828   ALT 48 05/01/2016 0828   ALKPHOS 68 05/01/2016 0828   BILITOT <0.22 05/01/2016 0828   GFRNONAA >60 04/15/2016 0942   GFRAA >60 04/15/2016 0942    INo results found for: SPEP, UPEP  Lab Results  Component Value Date   WBC 5.1 05/01/2016   NEUTROABS 3.4 05/01/2016   HGB 10.2 (L) 05/01/2016   HCT 30.6 (L) 05/01/2016   MCV 94.0 05/01/2016   PLT 187 05/01/2016      Chemistry      Component Value Date/Time   NA 141 05/01/2016 0828   K 3.9 05/01/2016 0828   CL 103 04/15/2016 0942   CO2 27 05/01/2016 0828   BUN 10.4 05/01/2016 0828   CREATININE 0.7 05/01/2016 0828      Component Value Date/Time   CALCIUM 8.6 05/01/2016 0828   ALKPHOS 68 05/01/2016 0828   AST 41 (H) 05/01/2016 0828   ALT 48 05/01/2016 0828   BILITOT <0.22 05/01/2016 0828       No results found for: LABCA2  No components found for: LABCA125  No results for input(s): INR in the last 168 hours.  Urinalysis No results found for: COLORURINE, APPEARANCEUR, LABSPEC, PHURINE, GLUCOSEU, HGBUR, BILIRUBINUR, KETONESUR, PROTEINUR, UROBILINOGEN, NITRITE, LEUKOCYTESUR   STUDIES: No results found.   ELIGIBLE FOR AVAILABLE RESEARCH PROTOCOL: no  ASSESSMENT: 60 y.o. [place]is [place], [place] woman status post left breast upper outer quadrant biopsy 12/02/2015 for a clinical T1c pN0, stage IA  invasive ductal carcinoma, grade 3, estrogen and progesterone receptor positive, with an MIB-1 of 15%, and HER-2 amplification  (a) biopsy of a 0.6 cm lower outer quadrant right breast mass 12/26/2015  (1) neoadjuvant  chemotherapy consisting of weekly paclitaxel 12 with trastuzumab and pertuzumab every 21 days 4 started 12/27/2015  (a) paclitaxel discontinued after 7 doses because of peripheral neuropathy  (b) received carboplatin/gemcitabine weekly 5 beginning 02/21/2016, last dose 03/27/2016  (c) cycle 3 of carboplatin/ delayed one  week because of thrombocytopenia; carbo dose reduced 10%  (d) cycle 5 carboplatin dose reduced an additional 10% and gemcitabine reduced to 800 mg/m due to thrombocytopenia  (2) trastuzumab to be continued to total of one year (through July 2018)  (a) echo 03/27/2016 showed an ejection fraction in the 60-65 % range.  (3) underwent bilateral skin sparing mastectomies with left sentinel lymph node sampling and immediate implant reconstruction 04/21/2016, showing  (a) on the right, no evidence of malignancy  (b) on the left, a residual pT1c pN0, stage IA  invasive ductal carcinoma, grade 2  (c) repeat prognostic panel confirms triple positive disease   (d) negative margins  (4) postmastectomy radiation not indicated   (5) anastrozole started 05/01/2016  (a) baseline bone density pending  (6) genetics testing 02/09/2016 through the 44-gene [facility] Custom Panel performed by [employer] [facility], [place]) found no deleterious mutations in APC, ATM, AXIN2, BARD1, BMPR1A, BRCA1, BRCA2, BRIP1, CDH1, CDKN2A, CHEK2, DICER1, EPCAM, GREM1, KIT, MEN1, MLH1, MSH2, MSH6, MUTYH, NBN, NF1, NF2, PALB2, PDGFRA, PMS2, POLD1, POLE, PTEN, RAD50, RAD51C, RAD51D, SDHA, SDHB, SDHC, SDHD, SMAD4, SMARCA4, SMARE1, STK11, TP53, TSC1, TSC2, and VHL  (a)   One variant of uncertain significance (VUS) was found in "c.1691C>G (p.Ala564Gly)" in one copy of the DICER1 gene   PLAN: Alexandra Mathis did remarkably well with her surgery and she got very good results. Not only did her tumor shrink to less than a half, but it also decreased in grade, which indicates that the more aggressive portion of the tumor should have been cleared by the chemotherapy.  The less aggressive portion of her tumor, which is estrogen receptor positive, will respond to anti-estrogens. Today we discussed at length the difference between tamoxifen and anastrozole._0 @   has completed her local treatment and is now ready to start  anti-estrogens.  We discussed the difference between tamoxifen and anastrozole in detail. She understands that anastrozole and the aromatase inhibitors in general work by blocking estrogen production. Accordingly vaginal dryness, decrease in bone density, and of course hot flashes can result. The aromatase inhibitors can also negatively affect the cholesterol profile, although that is a minor effect. One out of 5 women on aromatase inhibitors we will feel "old and achy". This arthralgia/myalgia syndrome, which resembles fibromyalgia clinically, does resolve with stopping the medications. Accordingly this is not a reason to not try an aromatase inhibitor but it is a frequent reason to stop it (in other words 20% of women will not be able to tolerate these medications).  Tamoxifen on the other hand does not block estrogen production. It does not "take away a woman's estrogen". It blocks the estrogen receptor in breast cells. Like anastrozole, it can also cause hot flashes. As opposed to anastrozole, tamoxifen has many estrogen-like effects. It is technically an estrogen receptor modulator. This means that in some tissues tamoxifen works like estrogen-- for example it helps strengthen the bones. It tends to improve the cholesterol profile. It can cause thickening of the endometrial lining, and even endometrial polyps or rarely cancer of the uterus.(The risk of uterine cancer due to tamoxifen is one additional cancer per thousand women year). It can cause vaginal wetness or stickiness. It can cause blood clots through  this estrogen-like effect--the risk of blood clots with tamoxifen is exactly the same as with birth control pills or hormone replacement.  Neither of these agents causes mood changes or weight gain, despite the popular belief that they can have these side effects. We have data from studies comparing either of these drugs with placebo, and in those cases the control group had the same amount of weight  gain and depression as the group that took the drug.  After much discussion and particularly given the severe history of heart disease in her family (which increases concerns regarding blood clots) Alexandra Mathis decided she would like to start anastrozole. She saw no reason to wait and so we are starting today. She will call me with any problems that may develop before her next visit here which will be the first week in March. She should have had her definitive implants placed by then.  She does need a baseline bone density which I have requested to be performed at [facility] sometime this month. She will also need a repeat echocardiogram early February. We are of course continuing the trastuzumab every 3 weeks until she completes a year on that medication  At this point I'm delighted that she has done so well. She will call with any problems that may develop before her next visit.    :Chauncey Cruel, MD   05/01/2016 9:56 AM Medical Oncology and Hematology [facility] [place], [place] [address] Tel. [phone]    Fax. [phone] 100 cGy.

## 2016-05-01 NOTE — Patient Instructions (Signed)
[facility] Discharge Instructions for Patients Receiving Chemotherapy  Today you received the following chemotherapy agents:  Herceptin  To help prevent nausea and vomiting after your treatment, we encourage you to take your nausea medication as prescribed.   If you develop nausea and vomiting that is not controlled by your nausea medication, call the clinic.   BELOW ARE SYMPTOMS THAT SHOULD BE REPORTED IMMEDIATELY:  *FEVER GREATER THAN 100.5 F  *CHILLS WITH OR WITHOUT FEVER  NAUSEA AND VOMITING THAT IS NOT CONTROLLED WITH YOUR NAUSEA MEDICATION  *UNUSUAL SHORTNESS OF BREATH  *UNUSUAL BRUISING OR BLEEDING  TENDERNESS IN MOUTH AND THROAT WITH OR WITHOUT PRESENCE OF ULCERS  *URINARY PROBLEMS  *BOWEL PROBLEMS  UNUSUAL RASH Items with * indicate a potential emergency and should be followed up as soon as possible.  Feel free to call the clinic you have any questions or concerns. The clinic phone number is [phone].  Please show the CHEMO ALERT CARD at check-in to the Emergency Department and triage nurse.   

## 2016-05-06 ENCOUNTER — Other Ambulatory Visit: Payer: Self-pay | Admitting: *Deleted

## 2016-05-06 MED ORDER — ANASTROZOLE 1 MG PO TABS: 1.0000 mg | ORAL_TABLET | Freq: Every day | ORAL | 3 refills | Status: DC

## 2016-05-22 ENCOUNTER — Ambulatory Visit (HOSPITAL_BASED_OUTPATIENT_CLINIC_OR_DEPARTMENT_OTHER): Payer: BC Managed Care – PPO

## 2016-05-22 ENCOUNTER — Other Ambulatory Visit (HOSPITAL_BASED_OUTPATIENT_CLINIC_OR_DEPARTMENT_OTHER): Payer: BC Managed Care – PPO

## 2016-05-22 ENCOUNTER — Other Ambulatory Visit: Payer: Self-pay | Admitting: Oncology

## 2016-05-22 ENCOUNTER — Ambulatory Visit: Payer: BC Managed Care – PPO

## 2016-05-22 VITALS — BP 125/68 | HR 81 | Temp 98.8°F | Resp 18

## 2016-05-22 DIAGNOSIS — C773 Secondary and unspecified malignant neoplasm of axilla and upper limb lymph nodes: Secondary | ICD-10-CM

## 2016-05-22 DIAGNOSIS — Z95828 Presence of other vascular implants and grafts: Secondary | ICD-10-CM

## 2016-05-22 DIAGNOSIS — Z170 Estrogen receptor positive status [ER+]: Secondary | ICD-10-CM

## 2016-05-22 DIAGNOSIS — Z5112 Encounter for antineoplastic immunotherapy: Secondary | ICD-10-CM

## 2016-05-22 DIAGNOSIS — C50412 Malignant neoplasm of upper-outer quadrant of left female breast: Secondary | ICD-10-CM

## 2016-05-22 LAB — CBC WITH DIFFERENTIAL/PLATELET
BASO%: 0.8 % (ref 0.0–2.0)
Basophils Absolute: 0 10*3/uL (ref 0.0–0.1)
EOS%: 2.5 % (ref 0.0–7.0)
Eosinophils Absolute: 0.1 10*3/uL (ref 0.0–0.5)
HEMATOCRIT: 34.2 % — AB (ref 34.8–46.6)
HEMOGLOBIN: 11.6 g/dL (ref 11.6–15.9)
LYMPH#: 1.4 10*3/uL (ref 0.9–3.3)
LYMPH%: 34.8 % (ref 14.0–49.7)
MCH: 30.7 pg (ref 25.1–34.0)
MCHC: 33.9 g/dL (ref 31.5–36.0)
MCV: 90.4 fL (ref 79.5–101.0)
MONO#: 0.4 10*3/uL (ref 0.1–0.9)
MONO%: 8.8 % (ref 0.0–14.0)
NEUT#: 2.2 10*3/uL (ref 1.5–6.5)
NEUT%: 53.1 % (ref 38.4–76.8)
Platelets: 188 10*3/uL (ref 145–400)
RBC: 3.79 10*6/uL (ref 3.70–5.45)
RDW: 14.2 % (ref 11.2–14.5)
WBC: 4.1 10*3/uL (ref 3.9–10.3)

## 2016-05-22 LAB — COMPREHENSIVE METABOLIC PANEL
ALT: 28 U/L (ref 0–55)
AST: 27 U/L (ref 5–34)
Albumin: 3.7 g/dL (ref 3.5–5.0)
Alkaline Phosphatase: 80 U/L (ref 40–150)
Anion Gap: 8 mEq/L (ref 3–11)
BUN: 17.5 mg/dL (ref 7.0–26.0)
CALCIUM: 9.4 mg/dL (ref 8.4–10.4)
CHLORIDE: 105 meq/L (ref 98–109)
CO2: 27 mEq/L (ref 22–29)
CREATININE: 0.7 mg/dL (ref 0.6–1.1)
EGFR: >90 mL/min/{1.73_m2} (ref >90)
GLUCOSE: 96 mg/dL (ref 70–140)
Potassium: 4.3 mEq/L (ref 3.5–5.1)
SODIUM: 141 meq/L (ref 136–145)
Total Bilirubin: 0.41 mg/dL (ref 0.20–1.20)
Total Protein: 7 g/dL (ref 6.4–8.3)

## 2016-05-22 MED ORDER — SODIUM CHLORIDE 0.9 % IV SOLN
Freq: Once | INTRAVENOUS | Status: AC
  Administered 2016-05-22: 09:00:00 via INTRAVENOUS

## 2016-05-22 MED ORDER — ACETAMINOPHEN 325 MG PO TABS
ORAL_TABLET | ORAL | Status: AC
  Filled 2016-05-22: qty 2

## 2016-05-22 MED ORDER — TRASTUZUMAB CHEMO 150 MG IV SOLR
6.0000 mg/kg | Freq: Once | INTRAVENOUS | Status: AC
  Administered 2016-05-22: 441 mg via INTRAVENOUS
  Filled 2016-05-22: qty 21

## 2016-05-22 MED ORDER — SODIUM CHLORIDE 0.9% FLUSH
10.0000 mL | INTRAVENOUS | Status: DC | PRN
  Administered 2016-05-22: 10 mL
  Filled 2016-05-22: qty 10

## 2016-05-22 MED ORDER — ACETAMINOPHEN 325 MG PO TABS
650.0000 mg | ORAL_TABLET | Freq: Once | ORAL | Status: AC
  Administered 2016-05-22: 650 mg via ORAL

## 2016-05-22 MED ORDER — SODIUM CHLORIDE 0.9 % IJ SOLN
10.0000 mL | INTRAMUSCULAR | Status: DC | PRN
  Administered 2016-05-22: 10 mL via INTRAVENOUS
  Filled 2016-05-22: qty 10

## 2016-05-22 MED ORDER — HEPARIN SOD (PORK) LOCK FLUSH 100 UNIT/ML IV SOLN
500.0000 [IU] | Freq: Once | INTRAVENOUS | Status: AC | PRN
  Administered 2016-05-22: 500 [IU]
  Filled 2016-05-22: qty 5

## 2016-05-22 NOTE — Patient Instructions (Signed)
[facility] Discharge Instructions for Patients Receiving Chemotherapy  Today you received the following chemotherapy agents: Herceptin   If you develop nausea and vomiting that is not controlled by your nausea medication, call the clinic.   BELOW ARE SYMPTOMS THAT SHOULD BE REPORTED IMMEDIATELY:  *FEVER GREATER THAN 100.5 F  *CHILLS WITH OR WITHOUT FEVER  NAUSEA AND VOMITING THAT IS NOT CONTROLLED WITH YOUR NAUSEA MEDICATION  *UNUSUAL SHORTNESS OF BREATH  *UNUSUAL BRUISING OR BLEEDING  TENDERNESS IN MOUTH AND THROAT WITH OR WITHOUT PRESENCE OF ULCERS  *URINARY PROBLEMS  *BOWEL PROBLEMS  UNUSUAL RASH Items with * indicate a potential emergency and should be followed up as soon as possible.  Feel free to call the clinic should you have any questions or concerns. The clinic phone number is [phone].  Please show the CHEMO ALERT CARD at check-in to the Emergency Department and triage nurse.   

## 2016-05-27 ENCOUNTER — Ambulatory Visit: Payer: BC Managed Care – PPO | Admitting: Physical Therapy

## 2016-06-01 ENCOUNTER — Ambulatory Visit: Payer: BC Managed Care – PPO | Admitting: Physical Therapy

## 2016-06-05 ENCOUNTER — Telehealth: Payer: Self-pay | Admitting: *Deleted

## 2016-06-05 NOTE — Telephone Encounter (Signed)
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 She can come on 06/15/16 morning, afternoon preferred, but will take anything.  Message sent to scheduler.

## 2016-06-09 ENCOUNTER — Ambulatory Visit: Payer: BC Managed Care – PPO | Admitting: Physical Therapy

## 2016-06-12 ENCOUNTER — Ambulatory Visit: Payer: BC Managed Care – PPO

## 2016-06-12 ENCOUNTER — Other Ambulatory Visit: Payer: BC Managed Care – PPO

## 2016-06-15 ENCOUNTER — Ambulatory Visit: Payer: BC Managed Care – PPO

## 2016-06-15 ENCOUNTER — Ambulatory Visit (HOSPITAL_BASED_OUTPATIENT_CLINIC_OR_DEPARTMENT_OTHER): Payer: BC Managed Care – PPO

## 2016-06-15 ENCOUNTER — Other Ambulatory Visit (HOSPITAL_BASED_OUTPATIENT_CLINIC_OR_DEPARTMENT_OTHER): Payer: BC Managed Care – PPO

## 2016-06-15 VITALS — BP 134/88 | HR 79 | Temp 98.1°F

## 2016-06-15 DIAGNOSIS — Z5112 Encounter for antineoplastic immunotherapy: Secondary | ICD-10-CM

## 2016-06-15 DIAGNOSIS — C50412 Malignant neoplasm of upper-outer quadrant of left female breast: Secondary | ICD-10-CM

## 2016-06-15 DIAGNOSIS — Z95828 Presence of other vascular implants and grafts: Secondary | ICD-10-CM

## 2016-06-15 DIAGNOSIS — Z170 Estrogen receptor positive status [ER+]: Secondary | ICD-10-CM

## 2016-06-15 DIAGNOSIS — C773 Secondary and unspecified malignant neoplasm of axilla and upper limb lymph nodes: Secondary | ICD-10-CM

## 2016-06-15 LAB — COMPREHENSIVE METABOLIC PANEL
ALT: 21 U/L (ref 0–55)
ANION GAP: 7 meq/L (ref 3–11)
AST: 24 U/L (ref 5–34)
Albumin: 3.9 g/dL (ref 3.5–5.0)
Alkaline Phosphatase: 79 U/L (ref 40–150)
BILIRUBIN TOTAL: 0.34 mg/dL (ref 0.20–1.20)
BUN: 14.2 mg/dL (ref 7.0–26.0)
CALCIUM: 9.9 mg/dL (ref 8.4–10.4)
CO2: 29 meq/L (ref 22–29)
CREATININE: 0.8 mg/dL (ref 0.6–1.1)
Chloride: 104 mEq/L (ref 98–109)
EGFR: 82 mL/min/{1.73_m2} — ABNORMAL LOW (ref >90)
Glucose: 95 mg/dl (ref 70–140)
Potassium: 4.3 mEq/L (ref 3.5–5.1)
Sodium: 140 mEq/L (ref 136–145)
TOTAL PROTEIN: 7.4 g/dL (ref 6.4–8.3)

## 2016-06-15 LAB — CBC WITH DIFFERENTIAL/PLATELET
BASO%: 0.8 % (ref 0.0–2.0)
Basophils Absolute: 0 10*3/uL (ref 0.0–0.1)
EOS%: 1.3 % (ref 0.0–7.0)
Eosinophils Absolute: 0 10*3/uL (ref 0.0–0.5)
HEMATOCRIT: 36.3 % (ref 34.8–46.6)
HEMOGLOBIN: 12.3 g/dL (ref 11.6–15.9)
LYMPH#: 1.5 10*3/uL (ref 0.9–3.3)
LYMPH%: 41.6 % (ref 14.0–49.7)
MCH: 29.6 pg (ref 25.1–34.0)
MCHC: 33.7 g/dL (ref 31.5–36.0)
MCV: 87.8 fL (ref 79.5–101.0)
MONO#: 0.3 10*3/uL (ref 0.1–0.9)
MONO%: 8.6 % (ref 0.0–14.0)
NEUT#: 1.8 10*3/uL (ref 1.5–6.5)
NEUT%: 47.7 % (ref 38.4–76.8)
Platelets: 192 10*3/uL (ref 145–400)
RBC: 4.14 10*6/uL (ref 3.70–5.45)
RDW: 13.8 % (ref 11.2–14.5)
WBC: 3.7 10*3/uL — ABNORMAL LOW (ref 3.9–10.3)

## 2016-06-15 MED ORDER — DIPHENHYDRAMINE HCL 25 MG PO CAPS: 25.0000 mg | ORAL_CAPSULE | Freq: Once | ORAL | Status: DC

## 2016-06-15 MED ORDER — ACETAMINOPHEN 325 MG PO TABS
ORAL_TABLET | ORAL | Status: AC
  Filled 2016-06-15: qty 2

## 2016-06-15 MED ORDER — DIPHENHYDRAMINE HCL 25 MG PO CAPS
ORAL_CAPSULE | ORAL | Status: AC
  Filled 2016-06-15: qty 1

## 2016-06-15 MED ORDER — SODIUM CHLORIDE 0.9 % IV SOLN
Freq: Once | INTRAVENOUS | Status: AC
  Administered 2016-06-15: 09:00:00 via INTRAVENOUS

## 2016-06-15 MED ORDER — SODIUM CHLORIDE 0.9 % IJ SOLN
10.0000 mL | INTRAMUSCULAR | Status: DC | PRN
  Administered 2016-06-15: 10 mL via INTRAVENOUS
  Filled 2016-06-15: qty 10

## 2016-06-15 MED ORDER — TRASTUZUMAB CHEMO 150 MG IV SOLR
6.0000 mg/kg | Freq: Once | INTRAVENOUS | Status: AC
  Administered 2016-06-15: 441 mg via INTRAVENOUS
  Filled 2016-06-15: qty 21

## 2016-06-15 MED ORDER — ACETAMINOPHEN 325 MG PO TABS
650.0000 mg | ORAL_TABLET | Freq: Once | ORAL | Status: AC
  Administered 2016-06-15: 650 mg via ORAL

## 2016-06-15 MED ORDER — SODIUM CHLORIDE 0.9% FLUSH
10.0000 mL | INTRAVENOUS | Status: DC | PRN
  Administered 2016-06-15: 10 mL
  Filled 2016-06-15: qty 10

## 2016-06-15 MED ORDER — HEPARIN SOD (PORK) LOCK FLUSH 100 UNIT/ML IV SOLN
500.0000 [IU] | Freq: Once | INTRAVENOUS | Status: AC | PRN
  Administered 2016-06-15: 500 [IU]
  Filled 2016-06-15: qty 5

## 2016-06-15 NOTE — Patient Instructions (Signed)

## 2016-06-15 NOTE — Patient Instructions (Signed)
[facility] Discharge Instructions for Patients Receiving Chemotherapy  Today you received the following chemotherapy agents: Herceptin   If you develop nausea and vomiting that is not controlled by your nausea medication, call the clinic.   BELOW ARE SYMPTOMS THAT SHOULD BE REPORTED IMMEDIATELY:  *FEVER GREATER THAN 100.5 F  *CHILLS WITH OR WITHOUT FEVER  NAUSEA AND VOMITING THAT IS NOT CONTROLLED WITH YOUR NAUSEA MEDICATION  *UNUSUAL SHORTNESS OF BREATH  *UNUSUAL BRUISING OR BLEEDING  TENDERNESS IN MOUTH AND THROAT WITH OR WITHOUT PRESENCE OF ULCERS  *URINARY PROBLEMS  *BOWEL PROBLEMS  UNUSUAL RASH Items with * indicate a potential emergency and should be followed up as soon as possible.  Feel free to call the clinic should you have any questions or concerns. The clinic phone number is [phone].  Please show the CHEMO ALERT CARD at check-in to the Emergency Department and triage nurse.   

## 2016-07-02 ENCOUNTER — Other Ambulatory Visit: Payer: Self-pay | Admitting: *Deleted

## 2016-07-03 ENCOUNTER — Ambulatory Visit (HOSPITAL_BASED_OUTPATIENT_CLINIC_OR_DEPARTMENT_OTHER): Payer: BC Managed Care – PPO

## 2016-07-03 ENCOUNTER — Other Ambulatory Visit (HOSPITAL_BASED_OUTPATIENT_CLINIC_OR_DEPARTMENT_OTHER): Payer: BC Managed Care – PPO

## 2016-07-03 ENCOUNTER — Ambulatory Visit: Payer: BC Managed Care – PPO

## 2016-07-03 ENCOUNTER — Telehealth: Payer: Self-pay | Admitting: Oncology

## 2016-07-03 VITALS — BP 119/77 | HR 83 | Temp 98.1°F | Resp 16

## 2016-07-03 DIAGNOSIS — C50412 Malignant neoplasm of upper-outer quadrant of left female breast: Secondary | ICD-10-CM

## 2016-07-03 DIAGNOSIS — Z170 Estrogen receptor positive status [ER+]: Secondary | ICD-10-CM

## 2016-07-03 DIAGNOSIS — Z5112 Encounter for antineoplastic immunotherapy: Secondary | ICD-10-CM

## 2016-07-03 DIAGNOSIS — C773 Secondary and unspecified malignant neoplasm of axilla and upper limb lymph nodes: Secondary | ICD-10-CM | POA: Diagnosis not present

## 2016-07-03 DIAGNOSIS — Z95828 Presence of other vascular implants and grafts: Secondary | ICD-10-CM

## 2016-07-03 LAB — COMPREHENSIVE METABOLIC PANEL
ALBUMIN: 4.1 g/dL (ref 3.5–5.0)
ALK PHOS: 72 U/L (ref 40–150)
ALT: 25 U/L (ref 0–55)
ANION GAP: 9 meq/L (ref 3–11)
AST: 29 U/L (ref 5–34)
BUN: 21.8 mg/dL (ref 7.0–26.0)
CALCIUM: 9.6 mg/dL (ref 8.4–10.4)
CO2: 27 mEq/L (ref 22–29)
Chloride: 102 mEq/L (ref 98–109)
Creatinine: 0.8 mg/dL (ref 0.6–1.1)
EGFR: 78 mL/min/{1.73_m2} — AB (ref >90)
Glucose: 93 mg/dl (ref 70–140)
Potassium: 4.2 mEq/L (ref 3.5–5.1)
Sodium: 138 mEq/L (ref 136–145)
TOTAL PROTEIN: 7.4 g/dL (ref 6.4–8.3)
Total Bilirubin: 0.48 mg/dL (ref 0.20–1.20)

## 2016-07-03 LAB — CBC WITH DIFFERENTIAL/PLATELET
BASO%: 0.5 % (ref 0.0–2.0)
Basophils Absolute: 0 10*3/uL (ref 0.0–0.1)
EOS ABS: 0 10*3/uL (ref 0.0–0.5)
EOS%: 1 % (ref 0.0–7.0)
HEMATOCRIT: 36.9 % (ref 34.8–46.6)
HEMOGLOBIN: 12.7 g/dL (ref 11.6–15.9)
LYMPH#: 1.4 10*3/uL (ref 0.9–3.3)
LYMPH%: 37.8 % (ref 14.0–49.7)
MCH: 29.4 pg (ref 25.1–34.0)
MCHC: 34.4 g/dL (ref 31.5–36.0)
MCV: 85.4 fL (ref 79.5–101.0)
MONO#: 0.4 10*3/uL (ref 0.1–0.9)
MONO%: 10.5 % (ref 0.0–14.0)
NEUT%: 50.2 % (ref 38.4–76.8)
NEUTROS ABS: 1.9 10*3/uL (ref 1.5–6.5)
PLATELETS: 166 10*3/uL (ref 145–400)
RBC: 4.32 10*6/uL (ref 3.70–5.45)
RDW: 12.9 % (ref 11.2–14.5)
WBC: 3.8 10*3/uL — AB (ref 3.9–10.3)

## 2016-07-03 MED ORDER — SODIUM CHLORIDE 0.9 % IV SOLN
6.0000 mg/kg | Freq: Once | INTRAVENOUS | Status: AC
  Administered 2016-07-03: 441 mg via INTRAVENOUS
  Filled 2016-07-03: qty 21

## 2016-07-03 MED ORDER — SODIUM CHLORIDE 0.9 % IJ SOLN
10.0000 mL | INTRAMUSCULAR | Status: DC | PRN
  Administered 2016-07-03: 10 mL via INTRAVENOUS
  Filled 2016-07-03: qty 10

## 2016-07-03 MED ORDER — HEPARIN SOD (PORK) LOCK FLUSH 100 UNIT/ML IV SOLN
500.0000 [IU] | Freq: Once | INTRAVENOUS | Status: AC | PRN
  Administered 2016-07-03: 500 [IU]
  Filled 2016-07-03: qty 5

## 2016-07-03 MED ORDER — ACETAMINOPHEN 325 MG PO TABS
650.0000 mg | ORAL_TABLET | Freq: Once | ORAL | Status: AC
  Administered 2016-07-03: 650 mg via ORAL

## 2016-07-03 MED ORDER — ACETAMINOPHEN 325 MG PO TABS
ORAL_TABLET | ORAL | Status: AC
  Filled 2016-07-03: qty 2

## 2016-07-03 MED ORDER — SODIUM CHLORIDE 0.9% FLUSH
10.0000 mL | INTRAVENOUS | Status: DC | PRN
  Administered 2016-07-03: 10 mL
  Filled 2016-07-03: qty 10

## 2016-07-03 MED ORDER — DIPHENHYDRAMINE HCL 25 MG PO CAPS: 25.0000 mg | ORAL_CAPSULE | Freq: Once | ORAL | Status: DC

## 2016-07-03 MED ORDER — SODIUM CHLORIDE 0.9 % IV SOLN
Freq: Once | INTRAVENOUS | Status: AC
  Administered 2016-07-03: 09:00:00 via INTRAVENOUS

## 2016-07-03 MED ORDER — DIPHENHYDRAMINE HCL 25 MG PO CAPS
ORAL_CAPSULE | ORAL | Status: AC
  Filled 2016-07-03: qty 1

## 2016-07-03 NOTE — Patient Instructions (Signed)
[facility] Discharge Instructions for Patients Receiving Chemotherapy  Today you received the following chemotherapy agents herceptin   To help prevent nausea and vomiting after your treatment, we encourage you to take your nausea medication as directed   If you develop nausea and vomiting that is not controlled by your nausea medication, call the clinic.   BELOW ARE SYMPTOMS THAT SHOULD BE REPORTED IMMEDIATELY:  *FEVER GREATER THAN 100.5 F  *CHILLS WITH OR WITHOUT FEVER  NAUSEA AND VOMITING THAT IS NOT CONTROLLED WITH YOUR NAUSEA MEDICATION  *UNUSUAL SHORTNESS OF BREATH  *UNUSUAL BRUISING OR BLEEDING  TENDERNESS IN MOUTH AND THROAT WITH OR WITHOUT PRESENCE OF ULCERS  *URINARY PROBLEMS  *BOWEL PROBLEMS  UNUSUAL RASH Items with * indicate a potential emergency and should be followed up as soon as possible.  Feel free to call the clinic you have any questions or concerns. The clinic phone number is [phone].  

## 2016-07-03 NOTE — Progress Notes (Signed)
Ok to treat using ECHO from 11/3 per Val, Therapist, sports.  Pt to be scheduled for ECHO.

## 2016-07-03 NOTE — Telephone Encounter (Signed)
lvm to inform pt of 2/14 Echo appt at [facility] at 9 am per LOS

## 2016-07-08 ENCOUNTER — Other Ambulatory Visit (HOSPITAL_COMMUNITY): Payer: BC Managed Care – PPO

## 2016-07-13 ENCOUNTER — Ambulatory Visit (HOSPITAL_COMMUNITY)
Admission: RE | Admit: 2016-07-13 | Discharge: 2016-07-13 | Disposition: A | Discharge status: Home / Self Care | Payer: BC Managed Care – PPO | Source: Ambulatory Visit | Attending: Oncology | Admitting: Oncology

## 2016-07-13 DIAGNOSIS — I361 Nonrheumatic tricuspid (valve) insufficiency: Secondary | ICD-10-CM | POA: Insufficient documentation

## 2016-07-13 DIAGNOSIS — Z09 Encounter for follow-up examination after completed treatment for conditions other than malignant neoplasm: Secondary | ICD-10-CM | POA: Diagnosis not present

## 2016-07-13 DIAGNOSIS — I351 Nonrheumatic aortic (valve) insufficiency: Secondary | ICD-10-CM | POA: Insufficient documentation

## 2016-07-13 DIAGNOSIS — C50412 Malignant neoplasm of upper-outer quadrant of left female breast: Secondary | ICD-10-CM | POA: Diagnosis not present

## 2016-07-13 DIAGNOSIS — Z170 Estrogen receptor positive status [ER+]: Secondary | ICD-10-CM

## 2016-07-13 NOTE — Progress Notes (Signed)
  Echocardiogram 2D Echocardiogram has been performed.  Alexandra Mathis 07/13/2016, 8:47 AM

## 2016-07-22 ENCOUNTER — Encounter (HOSPITAL_BASED_OUTPATIENT_CLINIC_OR_DEPARTMENT_OTHER): Payer: Self-pay | Admitting: *Deleted

## 2016-07-24 ENCOUNTER — Ambulatory Visit (HOSPITAL_BASED_OUTPATIENT_CLINIC_OR_DEPARTMENT_OTHER): Payer: BC Managed Care – PPO | Admitting: Oncology

## 2016-07-24 ENCOUNTER — Ambulatory Visit: Payer: BC Managed Care – PPO

## 2016-07-24 ENCOUNTER — Ambulatory Visit (HOSPITAL_BASED_OUTPATIENT_CLINIC_OR_DEPARTMENT_OTHER): Payer: BC Managed Care – PPO

## 2016-07-24 ENCOUNTER — Other Ambulatory Visit (HOSPITAL_BASED_OUTPATIENT_CLINIC_OR_DEPARTMENT_OTHER): Payer: BC Managed Care – PPO

## 2016-07-24 VITALS — BP 114/63 | HR 68 | Temp 97.6°F | Resp 18 | Ht 67.0 in | Wt 166.6 lb

## 2016-07-24 DIAGNOSIS — Z95828 Presence of other vascular implants and grafts: Secondary | ICD-10-CM

## 2016-07-24 DIAGNOSIS — Z5112 Encounter for antineoplastic immunotherapy: Secondary | ICD-10-CM | POA: Diagnosis not present

## 2016-07-24 DIAGNOSIS — C50412 Malignant neoplasm of upper-outer quadrant of left female breast: Secondary | ICD-10-CM

## 2016-07-24 DIAGNOSIS — C773 Secondary and unspecified malignant neoplasm of axilla and upper limb lymph nodes: Secondary | ICD-10-CM

## 2016-07-24 DIAGNOSIS — M8580 Other specified disorders of bone density and structure, unspecified site: Secondary | ICD-10-CM

## 2016-07-24 DIAGNOSIS — Z170 Estrogen receptor positive status [ER+]: Secondary | ICD-10-CM

## 2016-07-24 LAB — CBC WITH DIFFERENTIAL/PLATELET
BASO%: 0.4 % (ref 0.0–2.0)
Basophils Absolute: 0 10*3/uL (ref 0.0–0.1)
EOS%: 0.8 % (ref 0.0–7.0)
Eosinophils Absolute: 0 10*3/uL (ref 0.0–0.5)
HCT: 35.7 % (ref 34.8–46.6)
HGB: 12.1 g/dL (ref 11.6–15.9)
LYMPH%: 27.5 % (ref 14.0–49.7)
MCH: 28.7 pg (ref 25.1–34.0)
MCHC: 33.9 g/dL (ref 31.5–36.0)
MCV: 84.6 fL (ref 79.5–101.0)
MONO#: 0.4 10*3/uL (ref 0.1–0.9)
MONO%: 7.2 % (ref 0.0–14.0)
NEUT#: 3.3 10*3/uL (ref 1.5–6.5)
NEUT%: 64.1 % (ref 38.4–76.8)
Platelets: 155 10*3/uL (ref 145–400)
RBC: 4.22 10*6/uL (ref 3.70–5.45)
RDW: 13.4 % (ref 11.2–14.5)
WBC: 5.1 10*3/uL (ref 3.9–10.3)
lymph#: 1.4 10*3/uL (ref 0.9–3.3)

## 2016-07-24 LAB — COMPREHENSIVE METABOLIC PANEL
ALT: 20 U/L (ref 0–55)
AST: 23 U/L (ref 5–34)
Albumin: 3.9 g/dL (ref 3.5–5.0)
Alkaline Phosphatase: 71 U/L (ref 40–150)
Anion Gap: 10 mEq/L (ref 3–11)
BUN: 19.7 mg/dL (ref 7.0–26.0)
CHLORIDE: 104 meq/L (ref 98–109)
CO2: 28 mEq/L (ref 22–29)
Calcium: 9.6 mg/dL (ref 8.4–10.4)
Creatinine: 0.8 mg/dL (ref 0.6–1.1)
EGFR: 82 mL/min/{1.73_m2} — ABNORMAL LOW (ref >90)
GLUCOSE: 85 mg/dL (ref 70–140)
Potassium: 4.1 mEq/L (ref 3.5–5.1)
SODIUM: 141 meq/L (ref 136–145)
Total Bilirubin: 0.48 mg/dL (ref 0.20–1.20)
Total Protein: 7.1 g/dL (ref 6.4–8.3)

## 2016-07-24 MED ORDER — TRASTUZUMAB CHEMO 150 MG IV SOLR
6.0000 mg/kg | Freq: Once | INTRAVENOUS | Status: AC
  Administered 2016-07-24: 441 mg via INTRAVENOUS
  Filled 2016-07-24: qty 21

## 2016-07-24 MED ORDER — SODIUM CHLORIDE 0.9 % IV SOLN
Freq: Once | INTRAVENOUS | Status: AC
  Administered 2016-07-24: 10:00:00 via INTRAVENOUS

## 2016-07-24 MED ORDER — DIPHENHYDRAMINE HCL 25 MG PO CAPS
ORAL_CAPSULE | ORAL | Status: AC
  Filled 2016-07-24: qty 1

## 2016-07-24 MED ORDER — DIPHENHYDRAMINE HCL 25 MG PO CAPS: 25.0000 mg | ORAL_CAPSULE | Freq: Once | ORAL | Status: DC

## 2016-07-24 MED ORDER — SODIUM CHLORIDE 0.9% FLUSH
10.0000 mL | INTRAVENOUS | Status: DC | PRN
  Administered 2016-07-24: 10 mL
  Filled 2016-07-24: qty 10

## 2016-07-24 MED ORDER — ACETAMINOPHEN 325 MG PO TABS
650.0000 mg | ORAL_TABLET | Freq: Once | ORAL | Status: AC
  Administered 2016-07-24: 650 mg via ORAL

## 2016-07-24 MED ORDER — ACETAMINOPHEN 325 MG PO TABS
ORAL_TABLET | ORAL | Status: AC
  Filled 2016-07-24: qty 2

## 2016-07-24 MED ORDER — HEPARIN SOD (PORK) LOCK FLUSH 100 UNIT/ML IV SOLN
500.0000 [IU] | Freq: Once | INTRAVENOUS | Status: AC | PRN
  Administered 2016-07-24: 500 [IU]
  Filled 2016-07-24: qty 5

## 2016-07-24 MED ORDER — SODIUM CHLORIDE 0.9 % IJ SOLN
10.0000 mL | INTRAMUSCULAR | Status: DC | PRN
  Administered 2016-07-24: 10 mL via INTRAVENOUS
  Filled 2016-07-24: qty 10

## 2016-07-24 NOTE — Progress Notes (Signed)
Alexandra Mathis is arrived as perhis arival she went to the tolerating she wasproposed thatshe didn't elaborate [place]  [phone] Fax:(336) [phone]     ID: Alexandra Mathis DOB: [birth year]  MR#: [id number]  TGY#:[id number]  Patient Care Team: Darrol Jump, PA-C as PCP - General (Family Medicine) Fanny Skates, MD as Consulting Physician (General Surgery) Chauncey Cruel, MD as Consulting Physician (Oncology) Kyung Rudd, MD as Consulting Physician (Radiation Oncology) Larey Dresser, MD as Consulting Physician (Cardiology) Irene Limbo, MD as Consulting Physician (Plastic Surgery) Megan Salon, MD as Consulting Physician (Gynecology) OTHER MD:  CHIEF COMPLAINT: Triple positive breast cancer  CURRENT TREATMENT: trastuzumab, anastrozole   BREAST CANCER HISTORY: From the original intake note:  "Alexandra Mathis" had screening mammography in June 2017 showing left breast upper outer quadrant calcifications. Accordingly she was referred for left diagnostic mammography and ultrasonography, performed 11/27/2015. The breast density was category C. Compression views of the left breast found pleomorphic calcifications in the upper outer quadrant measuring up to 4.0 cm. There was an area of associated architectural distortion in the upper outer quadrant as well. On physical exam there was an irregular thickening in the left breast at the 2:00 position anteriorly. Ultrasonography confirmed a hypoechoic irregular mass measuring 1.6 cm in the 2:00 position 2 cm from the nipple. An intramammary lymph node was noted with benign appearing. In the left axilla there was a lymph node with focally thickened cortex.  Biopsy of the left breast mass and left axillary lymph node 12/02/2015 found ([id number]), in the breast, invasive ductal carcinoma, grade 3, estrogen receptor 95% positive, progesterone receptor 15% positive, both with strong staining intensity, with an MIB-1 of 15%,  and HER-2 amplification, the signals ratio being 2.54 and the number per cell 6.10. Biopsy of the left axillary lymph node was negative.  Her subsequent history is as detailed below  INTERVAL HISTORY: Alexandra Mathis returns today for follow-up of her triple negative breast cancer. She is tolerating the anastrozole well.  Hot flashes and vaginal dryness are not a major issue. She never developed the arthralgias or myalgias that many patients can experience on this medication. She obtains it at a good price.  She also continues on trastuzumab every 21 days, with a dose due today. She had her most recent echo 07/13/2016 with a 55% ejection fraction  REVIEW OF SYSTEMS: Her hair is coming in quite curly. She says he gets less curly when it gets longer. She is continuing to work full-time and she never did take more than an occasional day off from work during her intensive treatment. She is concerned about weight gain but has managed to lose 8 pounds. She has significant right knee problems. This had been ongoing for several years. It is to the point that she really will need a knee replacement likely sometime this year. She uses an elliptical about 30 minutes most. Aside from these issues a detailed review of systems today was benign  PAST MEDICAL HISTORY: Past Medical History:  Diagnosis Date  . Anemia   . Arthritis    "mostly in my hands; alot in my knees" (04/21/2016)  . Breast cancer of upper-outer quadrant of left female breast ([place]) 12/05/2015  . Chemotherapy management, encounter for 12/27/15 started  . Chest pain   . Family history of premature CAD   . Fibroid   . HLD (hyperlipidemia)   . Neuropathy (HCC)    feet  . Pneumonia 1996; 2006  . PONV (postoperative nausea  and vomiting)    scop patch works well    PAST SURGICAL HISTORY: Past Surgical History:  Procedure Laterality Date  . ABDOMINAL HYSTERECTOMY  1990   done with last cesarean section; "still have my ovaries"  . ANTERIOR CRUCIATE  LIGAMENT REPAIR Right   . BREAST BIOPSY Left 2017  . BREAST BIOPSY Right 2017   "MRI guided; benign"  . BREAST RECONSTRUCTION WITH PLACEMENT OF TISSUE EXPANDER AND FLEX HD (ACELLULAR HYDRATED DERMIS) Bilateral 04/21/2016  . BREAST RECONSTRUCTION WITH PLACEMENT OF TISSUE EXPANDER AND FLEX HD (ACELLULAR HYDRATED DERMIS) Bilateral 04/21/2016   Procedure: BREAST RECONSTRUCTION WITH PLACEMENT OF TISSUE EXPANDER AND ALLODERM;  Surgeon: Irene Limbo, MD;  Location: [facility];  Service: Plastics;  Laterality: Bilateral;  . [place]; 1983; 1987; 1990  . CESAREAN SECTION     done with last cesarean section  . CYST EXCISION  2007   on abdomen- 2 cyst removed; "benign"  . DILATION AND CURETTAGE OF UTERUS  ~ 1980; 1989  . GANGLION CYST EXCISION Right ~ 1975  . LAPAROSCOPIC ENDOMETRIOSIS FULGURATION  1989   w/D&C  . MASTECTOMY Bilateral 04/21/2016   LEFT SKIN SPARING MASTECTOMY WITH LEFT SENTINAL LYMPH NODE BIOPSY, RIGHT PROPHYLATIC SKIN SPARING MASTECTOMY   . NIPPLE SPARING MASTECTOMY/SENTINAL LYMPH NODE BIOPSY/RECONSTRUCTION/PLACEMENT OF TISSUE EXPANDER Bilateral 04/21/2016   Procedure: LEFT SKIN SPARING MASTECTOMY WITH LEFT SENTINAL LYMPH NODE BIOPSY, RIGHT PROPHYLATIC SKIN SPARING MASTECTOMY;  Surgeon: Fanny Skates, MD;  Location: [facility];  Service: General;  Laterality: Bilateral;  . PORTACATH PLACEMENT Right 12/25/2015   Procedure: INSERTION PORT-A-CATH WITH [place];  Surgeon: Fanny Skates, MD;  Location: [facility];  Service: General;  Laterality: Right;  . TONSILLECTOMY    . WISDOM TOOTH EXTRACTION  1978    FAMILY HISTORY Family History  Problem Relation Age of Onset  . Other Mother     benign meningioma dx 52 s/p surgery; hx of hysterectomy for hemorrhaging  . Hypothyroidism Mother   . CAD Mother   . Colon cancer Father 100    w/ mets to lungs and liver; + chemo; d. 60  . Gout Father   . Arthritis Father   . Ovarian cancer Sister     early 36s; s/p USO  . Breast cancer Sister 74     s/p mastectomy and aromatase inhibitor  . Other Paternal Aunt 53    Primary peritoneal cancer; s/p surgery  . Cancer Paternal Uncle   . Ovarian cancer Sister 41    "pre-cancer"; s/p BSO  . Cervical cancer Sister   . Basal cell carcinoma Sister   . Colon polyps Sister     unspecified number  . Breast cancer Cousin 64    s/p mastectomy  . Ovarian cancer Cousin 18    s/p BSO  . Leukemia Cousin 34  . Cancer Cousin     dx carcinoid bowel tumor  . Other Cousin     reportedly negative "BRCA1/2 testing"  . Breast cancer Cousin 34    reportedly negative GT is 2014-2015  . CAD Maternal Grandmother     d. 90s  . Heart attack Maternal Grandfather 51  . Heart Problems Paternal Grandmother   . Heart Problems Paternal Grandfather   . CAD Brother     triple vessel CAD, d. 58  . Other Son     enlarged prostate w/ surveillance  . Thyroid cancer Maternal Uncle 71    unspecified type  . CAD Maternal Uncle     d. 21  .  Other Cousin 48    dx benign "skin polyp"  . Breast cancer Other     paternal great aunt (PGM's sister)  . Breast cancer Other     paternal great aunt (PGM's sister)    GYNECOLOGIC HISTORY:  No LMP recorded. Patient has had a hysterectomy. Menarche age 37, first live birth age 58. The patient is GX P4. She stopped having periods in 1989. She did not use hormone replacement. She used oral contraceptives less than one year.  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 Her husband [name] is retired from a [employer]. Son [name] lives in [place] and works as a Chief Strategy Officer. Son [name] lives in [place] and is with the [employer]. Son [name] this and cocoa [place] where he works as an Chief Financial Officer. Son [name] lives in [place] were he works as an Chief Financial Officer. The patient has 4 grandchildren including a 51-year-old they have custody of and lives with them. The patient attends our [employer]: In place   HEALTH MAINTENANCE: Social History  Substance Use Topics  . Smoking status: Former Smoker    Years: 0.25    Types: Cigarettes    Quit date: 05/25/1974  . Smokeless tobacco: Never Used     Comment: was only a social/weekend smoker; smoked for a couple months total  . Alcohol use 0.5 oz/week    1 Standard drinks or equivalent per week     Comment: 04/21/2016 "1 glass wine a few times/year"     Colonoscopy:  PAP:  Bone density:   Allergies  Allergen Reactions  . Morphine And Related Hives, Nausea And Vomiting and Rash  . Vicodin [Hydrocodone-Acetaminophen] Palpitations    Can tolerate tylenol #3    Current Outpatient Prescriptions  Medication Sig Dispense Refill  . anastrozole (ARIMIDEX) 1 MG tablet Take 1 tablet (1 mg total) by mouth daily. 30 tablet 3  . ascorbic acid (VITAMIN C) 250 MG CHEW Chew 500 mg by mouth daily. In the winter months    . aspirin EC 81 MG tablet Take 81 mg by mouth daily.    Marland Kitchen atorvastatin (LIPITOR) 40 MG tablet Take 40 mg by mouth daily.  5  . Cetirizine HCl 10 MG CAPS Take 10 mg by mouth daily as needed. allergies    . Cholecalciferol 1000 units capsule Take 2,000 Units by mouth daily.     Marland Kitchen etodolac (LODINE) 400 MG tablet Take 400 mg by mouth 2 (two) times daily as needed for mild pain.   2  . FIBER COMPLETE PO Take 5 tablets by mouth daily.     . methocarbamol (ROBAXIN) 500 MG tablet Take 1 tablet (500 mg total) by mouth every 8 (eight) hours as needed for muscle spasms. 30 tablet 0  . Multiple Vitamin (MULTIVITAMIN) tablet Take 1 tablet by mouth daily.      . nitroGLYCERIN (NITROLINGUAL) 0.4 MG/SPRAY spray Place 1 spray under the tongue every 5 (five) minutes x 3 doses as needed for chest pain.    . Probiotic Product (ALIGN PO) Take 1 tablet by mouth daily.     . Vitamins C E (CRANBERRY CONCENTRATE PO) Take 1 tablet by mouth daily.      No current facility-administered medications for this visit.    Facility-Administered  Medications Ordered in Other Visits  Medication Dose Route Frequency Provider Last Rate Last Dose  . sodium chloride 0.9 % injection 10 mL  10 mL Intravenous PRN Chauncey Cruel, MD   10 mL at 06/15/16 0846    OBJECTIVE: Middle-aged white woman In no acute distress   Vitals:   07/24/16 0840  BP: 114/63  Pulse: 68  Resp: 18  Temp: 97.6 F (36.4 C)     Body mass index is 26.09 kg/m.    ECOG FS:1 - Symptomatic but completely ambulatory  Sclerae unicteric, EOMs intact Oropharynx clear and moist No cervical or supraclavicular adenopathy Lungs no rales or rhonchi Heart regular rate and rhythm Abd soft, nontender, positive bowel sounds MSK no focal spinal tenderness, no upper extremity lymphedema Neuro: nonfocal, well oriented, appropriate affect Breasts: Status post bilateral mastectomies with bilateral implants in place. The cosmetic result is good. There is no evidence of local recurrence. Both axillae are benign.   LAB RESULTS:  CMP     Component Value Date/Time   NA 138 07/03/2016 0820   K 4.2 07/03/2016 0820   CL 103 04/15/2016 0942   CO2 27 07/03/2016 0820   GLUCOSE 93 07/03/2016 0820   BUN 21.8 07/03/2016 0820   CREATININE 0.8 07/03/2016 0820   CALCIUM 9.6 07/03/2016 0820   PROT 7.4 07/03/2016 0820   ALBUMIN 4.1 07/03/2016 0820   AST 29 07/03/2016 0820   ALT 25 07/03/2016 0820   ALKPHOS 72 07/03/2016 0820   BILITOT 0.48 07/03/2016 0820   GFRNONAA >60 04/15/2016 0942   GFRAA >60 04/15/2016 0942    INo results found for: SPEP, UPEP  Lab Results  Component Value Date   WBC 5.1 07/24/2016   NEUTROABS 3.3 07/24/2016   HGB 12.1 07/24/2016   HCT 35.7 07/24/2016   MCV 84.6 07/24/2016   PLT 155 07/24/2016      Chemistry      Component Value Date/Time   NA 138 07/03/2016 0820   K 4.2 07/03/2016 0820   CL 103 04/15/2016 0942   CO2 27 07/03/2016 0820   BUN 21.8 07/03/2016 0820   CREATININE 0.8 07/03/2016 0820      Component Value Date/Time   CALCIUM  9.6 07/03/2016 0820   ALKPHOS 72 07/03/2016 0820   AST 29 07/03/2016 0820   ALT 25 07/03/2016 0820   BILITOT 0.48 07/03/2016 0820       No results found for: LABCA2  No components found for: LABCA125  No results for input(s): INR in the last 168 hours.  Urinalysis No results found for: COLORURINE, APPEARANCEUR, LABSPEC, PHURINE, GLUCOSEU, HGBUR, BILIRUBINUR, KETONESUR, PROTEINUR, UROBILINOGEN, NITRITE, LEUKOCYTESUR   STUDIES: No results found.   ELIGIBLE FOR AVAILABLE RESEARCH PROTOCOL: no  ASSESSMENT: 61 y.o. Alexandra Mathis, [facility] woman status post left breast upper outer quadrant biopsy 12/02/2015 for a clinical T1c pN0, stage IA  invasive ductal carcinoma, grade 3, estrogen and progesterone receptor positive, with an MIB-1 of 15%, and HER-2 amplification  (a) biopsy of a 0.6 cm lower outer quadrant right breast mass 12/26/2015  (1) neoadjuvant chemotherapy consisting of weekly paclitaxel 12 with trastuzumab and pertuzumab every 21 days 4 started 12/27/2015  (a) paclitaxel discontinued after 7 doses because of peripheral neuropathy  (b) received carboplatin/gemcitabine weekly 5 beginning 02/21/2016, last dose 03/27/2016  (c) cycle 3 of carboplatin/ delayed one week because of thrombocytopenia; carbo dose reduced 10%  (d) cycle 5 carboplatin dose reduced an additional 10% and gemcitabine reduced to 800 mg/m due to thrombocytopenia  (2) trastuzumab to be continued to total of one year (through July 2018)  (a) echo showed an 07/13/2016 ejection fraction in the 55 %  range.  (3) underwent bilateral skin sparing mastectomies with left sentinel lymph node sampling and immediate implant reconstruction 04/21/2016, showing  (a) on the right, no evidence of malignancy  (b) on the left, a residual pT1c pN0, stage IA  invasive ductal carcinoma, grade 2  (c) repeat prognostic panel confirms triple positive disease   (d) negative margins  (4) postmastectomy radiation not indicated    (5) anastrozole started 05/01/2016  (a) baseline bone density pending  (6) genetics testing 02/09/2016 through the 44-gene [facility] Custom Panel performed by [facility], [place]) found no deleterious mutations in APC, ATM, AXIN2, BARD1, BMPR1A, BRCA1, BRCA2, BRIP1, CDH1, CDKN2A, CHEK2, DICER1, EPCAM, GREM1, KIT, MEN1, MLH1, MSH2, MSH6, MUTYH, NBN, NF1, NF2, PALB2, PDGFRA, PMS2, POLD1, POLE, PTEN, RAD50, RAD51C, RAD51D, SDHA, SDHB, SDHC, SDHD, SMAD4, SMARCA4, SMARE1, STK11, TP53, TSC1, TSC2, and VHL  (a)   One variant of uncertain significance (VUS) was found in "c.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 She has recovered nicely from her reconstruction. She is tolerating the trastuzumab with no side effects that she is aware of and maintains an excellent functional status. She is even doing well with the anastrozole, with no significant side effects.  She does need a bone density. I have scheduled that for 3 weeks from today so she can do the same day as her next trastuzumab treatment. That will save her a trip from [place].  Her biggest problem right now is her right knee. This has been an ongoing issue and she knows she needs a knee replacement. She will likely get that accomplished later in the summer. Around that time she will also undergo her bilateral salpingo-oophorectomy.  I am going to see her with her last trastuzumab treatment. I will set her up for port removal at that time. Of course she still needs at least one more echo sometime in May.  I'm delighted at how well she is doing. She knows to call for any problems that may develop before her next visit here.      Chauncey Cruel, MD   07/24/2016 8:41 AM Medical Oncology and Hematology [facility] [address] [place], [place] [address] Tel. [phone]     Fax. [phone] 100 cGy.

## 2016-07-24 NOTE — Patient Instructions (Signed)
[facility] Discharge Instructions for Patients Receiving Chemotherapy  Today you received the following chemotherapy agents: Herceptin   To help prevent nausea and vomiting after your treatment, we encourage you to take your nausea medication as directed.    If you develop nausea and vomiting that is not controlled by your nausea medication, call the clinic.   BELOW ARE SYMPTOMS THAT SHOULD BE REPORTED IMMEDIATELY:  *FEVER GREATER THAN 100.5 F  *CHILLS WITH OR WITHOUT FEVER  NAUSEA AND VOMITING THAT IS NOT CONTROLLED WITH YOUR NAUSEA MEDICATION  *UNUSUAL SHORTNESS OF BREATH  *UNUSUAL BRUISING OR BLEEDING  TENDERNESS IN MOUTH AND THROAT WITH OR WITHOUT PRESENCE OF ULCERS  *URINARY PROBLEMS  *BOWEL PROBLEMS  UNUSUAL RASH Items with * indicate a potential emergency and should be followed up as soon as possible.  Feel free to call the clinic you have any questions or concerns. The clinic phone number is [phone].  Please show the CHEMO ALERT CARD at check-in to the Emergency Department and triage nurse.   

## 2016-07-24 NOTE — Progress Notes (Signed)
Pt given Boost drink and instructed to drink day of surgery by [phone] with teach back method.

## 2016-07-24 NOTE — Progress Notes (Signed)
Per Dr. Jana Hakim okay to proceed with treatment with Echo from 07/13/16 with EF of 55%

## 2016-07-28 ENCOUNTER — Ambulatory Visit (HOSPITAL_BASED_OUTPATIENT_CLINIC_OR_DEPARTMENT_OTHER): Payer: BC Managed Care – PPO | Admitting: Anesthesiology

## 2016-07-28 ENCOUNTER — Ambulatory Visit (HOSPITAL_BASED_OUTPATIENT_CLINIC_OR_DEPARTMENT_OTHER)
Admission: RE | Admit: 2016-07-28 | Discharge: 2016-07-28 | Disposition: A | Discharge status: Home / Self Care | Payer: BC Managed Care – PPO | Source: Ambulatory Visit | Attending: Plastic Surgery | Admitting: Plastic Surgery

## 2016-07-28 ENCOUNTER — Encounter (HOSPITAL_BASED_OUTPATIENT_CLINIC_OR_DEPARTMENT_OTHER): Payer: Self-pay

## 2016-07-28 ENCOUNTER — Other Ambulatory Visit: Payer: BC Managed Care – PPO

## 2016-07-28 ENCOUNTER — Encounter (HOSPITAL_BASED_OUTPATIENT_CLINIC_OR_DEPARTMENT_OTHER)
Admission: RE | Disposition: A | Discharge status: Home / Self Care | Payer: Self-pay | Source: Ambulatory Visit | Attending: Plastic Surgery

## 2016-07-28 DIAGNOSIS — Z87891 Personal history of nicotine dependence: Secondary | ICD-10-CM | POA: Insufficient documentation

## 2016-07-28 DIAGNOSIS — Z9221 Personal history of antineoplastic chemotherapy: Secondary | ICD-10-CM | POA: Insufficient documentation

## 2016-07-28 DIAGNOSIS — Z853 Personal history of malignant neoplasm of breast: Secondary | ICD-10-CM | POA: Insufficient documentation

## 2016-07-28 DIAGNOSIS — Z9013 Acquired absence of bilateral breasts and nipples: Secondary | ICD-10-CM | POA: Diagnosis not present

## 2016-07-28 DIAGNOSIS — Z803 Family history of malignant neoplasm of breast: Secondary | ICD-10-CM | POA: Insufficient documentation

## 2016-07-28 DIAGNOSIS — M1990 Unspecified osteoarthritis, unspecified site: Secondary | ICD-10-CM | POA: Diagnosis not present

## 2016-07-28 DIAGNOSIS — Z421 Encounter for breast reconstruction following mastectomy: Secondary | ICD-10-CM | POA: Diagnosis not present

## 2016-07-28 HISTORY — PX: REMOVAL OF BILATERAL TISSUE EXPANDERS WITH PLACEMENT OF BILATERAL BREAST IMPLANTS: SHX6431

## 2016-07-28 HISTORY — DX: Polyneuropathy, unspecified: G62.9

## 2016-07-28 SURGERY — REMOVAL, TISSUE EXPANDER, BREAST, BILATERAL, WITH BILATERAL IMPLANT IMPLANT INSERTION
Anesthesia: General | Site: Breast | Laterality: Bilateral

## 2016-07-28 MED ORDER — FENTANYL CITRATE (PF) 100 MCG/2ML IJ SOLN
INTRAMUSCULAR | Status: AC
  Filled 2016-07-28: qty 2

## 2016-07-28 MED ORDER — SUGAMMADEX SODIUM 200 MG/2ML IV SOLN
INTRAVENOUS | Status: AC
  Filled 2016-07-28: qty 2

## 2016-07-28 MED ORDER — PHENYLEPHRINE 40 MCG/ML (10ML) SYRINGE FOR IV PUSH (FOR BLOOD PRESSURE SUPPORT)
PREFILLED_SYRINGE | INTRAVENOUS | Status: AC
  Filled 2016-07-28: qty 10

## 2016-07-28 MED ORDER — EPHEDRINE SULFATE 50 MG/ML IJ SOLN
INTRAMUSCULAR | Status: DC | PRN
  Administered 2016-07-28: 10 mg via INTRAVENOUS
  Administered 2016-07-28: 5 mg via INTRAVENOUS

## 2016-07-28 MED ORDER — DEXAMETHASONE SODIUM PHOSPHATE 4 MG/ML IJ SOLN
INTRAMUSCULAR | Status: DC | PRN
  Administered 2016-07-28: 10 mg via INTRAVENOUS

## 2016-07-28 MED ORDER — FENTANYL CITRATE (PF) 100 MCG/2ML IJ SOLN: 50.0000 ug | INTRAMUSCULAR | Status: DC | PRN

## 2016-07-28 MED ORDER — ROCURONIUM BROMIDE 50 MG/5ML IV SOSY
PREFILLED_SYRINGE | INTRAVENOUS | Status: AC
  Filled 2016-07-28: qty 5

## 2016-07-28 MED ORDER — DEXAMETHASONE SODIUM PHOSPHATE 10 MG/ML IJ SOLN
INTRAMUSCULAR | Status: AC
  Filled 2016-07-28: qty 1

## 2016-07-28 MED ORDER — PROPOFOL 10 MG/ML IV BOLUS
INTRAVENOUS | Status: AC
  Filled 2016-07-28: qty 40

## 2016-07-28 MED ORDER — SULFAMETHOXAZOLE-TRIMETHOPRIM 800-160 MG PO TABS: 1.0000 | ORAL_TABLET | Freq: Two times a day (BID) | ORAL | 0 refills | Status: DC

## 2016-07-28 MED ORDER — CELECOXIB 400 MG PO CAPS
400.0000 mg | ORAL_CAPSULE | ORAL | Status: AC
  Administered 2016-07-28: 400 mg via ORAL

## 2016-07-28 MED ORDER — ACETAMINOPHEN 500 MG PO TABS
ORAL_TABLET | ORAL | Status: AC
  Filled 2016-07-28: qty 2

## 2016-07-28 MED ORDER — PROPOFOL 10 MG/ML IV BOLUS
INTRAVENOUS | Status: DC | PRN
  Administered 2016-07-28: 150 mg via INTRAVENOUS

## 2016-07-28 MED ORDER — GABAPENTIN 300 MG PO CAPS
300.0000 mg | ORAL_CAPSULE | ORAL | Status: AC
  Administered 2016-07-28: 300 mg via ORAL

## 2016-07-28 MED ORDER — CEFAZOLIN SODIUM-DEXTROSE 2-4 GM/100ML-% IV SOLN
2.0000 g | INTRAVENOUS | Status: AC
  Administered 2016-07-28: 2 g via INTRAVENOUS

## 2016-07-28 MED ORDER — MIDAZOLAM HCL 2 MG/2ML IJ SOLN: 1.0000 mg | INTRAMUSCULAR | Status: DC | PRN

## 2016-07-28 MED ORDER — GENTAMICIN SULFATE 40 MG/ML IJ SOLN
INTRAMUSCULAR | Status: DC | PRN
  Administered 2016-07-28: 1000 mL

## 2016-07-28 MED ORDER — ROCURONIUM BROMIDE 100 MG/10ML IV SOLN
INTRAVENOUS | Status: DC | PRN
  Administered 2016-07-28: 10 mg via INTRAVENOUS
  Administered 2016-07-28: 20 mg via INTRAVENOUS

## 2016-07-28 MED ORDER — ACETAMINOPHEN 500 MG PO TABS
1000.0000 mg | ORAL_TABLET | ORAL | Status: AC
  Administered 2016-07-28: 1000 mg via ORAL

## 2016-07-28 MED ORDER — ACETAMINOPHEN-CODEINE #3 300-30 MG PO TABS: 1.0000 | ORAL_TABLET | ORAL | 0 refills | Status: DC | PRN

## 2016-07-28 MED ORDER — GABAPENTIN 300 MG PO CAPS
ORAL_CAPSULE | ORAL | Status: AC
  Filled 2016-07-28: qty 1

## 2016-07-28 MED ORDER — LACTATED RINGERS IV SOLN
INTRAVENOUS | Status: DC | PRN
  Administered 2016-07-28 (×2): via INTRAVENOUS

## 2016-07-28 MED ORDER — SUGAMMADEX SODIUM 200 MG/2ML IV SOLN
INTRAVENOUS | Status: DC | PRN
  Administered 2016-07-28: 152.4 mg via INTRAVENOUS

## 2016-07-28 MED ORDER — PROMETHAZINE HCL 25 MG/ML IJ SOLN: 6.2500 mg | INTRAMUSCULAR | Status: DC | PRN

## 2016-07-28 MED ORDER — SUCCINYLCHOLINE CHLORIDE 20 MG/ML IJ SOLN
INTRAMUSCULAR | Status: DC | PRN
  Administered 2016-07-28: 100 mg via INTRAVENOUS

## 2016-07-28 MED ORDER — SCOPOLAMINE 1 MG/3DAYS TD PT72
1.0000 | MEDICATED_PATCH | Freq: Once | TRANSDERMAL | Status: DC | PRN
  Administered 2016-07-28: 1.5 mg via TRANSDERMAL

## 2016-07-28 MED ORDER — LACTATED RINGERS IV SOLN
INTRAVENOUS | Status: DC
  Administered 2016-07-28: 07:00:00 via INTRAVENOUS

## 2016-07-28 MED ORDER — LIDOCAINE 2% (20 MG/ML) 5 ML SYRINGE
INTRAMUSCULAR | Status: AC
  Filled 2016-07-28: qty 5

## 2016-07-28 MED ORDER — LIDOCAINE 2% (20 MG/ML) 5 ML SYRINGE
INTRAMUSCULAR | Status: DC | PRN
  Administered 2016-07-28: 50 mg via INTRAVENOUS

## 2016-07-28 MED ORDER — FENTANYL CITRATE (PF) 100 MCG/2ML IJ SOLN
25.0000 ug | INTRAMUSCULAR | Status: DC | PRN
  Administered 2016-07-28: 25 ug via INTRAVENOUS
  Administered 2016-07-28: 50 ug via INTRAVENOUS

## 2016-07-28 MED ORDER — EPHEDRINE 5 MG/ML INJ
INTRAVENOUS | Status: AC
  Filled 2016-07-28: qty 10

## 2016-07-28 MED ORDER — PHENYLEPHRINE HCL 10 MG/ML IJ SOLN
INTRAMUSCULAR | Status: DC | PRN
  Administered 2016-07-28: 40 ug via INTRAVENOUS

## 2016-07-28 MED ORDER — FENTANYL CITRATE (PF) 100 MCG/2ML IJ SOLN
INTRAMUSCULAR | Status: DC | PRN
  Administered 2016-07-28: 100 ug via INTRAVENOUS
  Administered 2016-07-28: 50 ug via INTRAVENOUS

## 2016-07-28 MED ORDER — MIDAZOLAM HCL 2 MG/2ML IJ SOLN
INTRAMUSCULAR | Status: AC
  Filled 2016-07-28: qty 2

## 2016-07-28 MED ORDER — CEFAZOLIN SODIUM-DEXTROSE 2-4 GM/100ML-% IV SOLN
INTRAVENOUS | Status: AC
  Filled 2016-07-28: qty 100

## 2016-07-28 MED ORDER — ONDANSETRON HCL 4 MG/2ML IJ SOLN
INTRAMUSCULAR | Status: DC | PRN
  Administered 2016-07-28: 4 mg via INTRAVENOUS

## 2016-07-28 MED ORDER — SCOPOLAMINE 1 MG/3DAYS TD PT72
MEDICATED_PATCH | TRANSDERMAL | Status: AC
  Filled 2016-07-28: qty 1

## 2016-07-28 MED ORDER — CHLORHEXIDINE GLUCONATE CLOTH 2 % EX PADS: 6.0000 | MEDICATED_PAD | Freq: Once | CUTANEOUS | Status: DC

## 2016-07-28 MED ORDER — CELECOXIB 200 MG PO CAPS
ORAL_CAPSULE | ORAL | Status: AC
  Filled 2016-07-28: qty 2

## 2016-07-28 MED ORDER — ONDANSETRON HCL 4 MG/2ML IJ SOLN
INTRAMUSCULAR | Status: AC
  Filled 2016-07-28: qty 2

## 2016-07-28 SURGICAL SUPPLY — 81 items
BAG DECANTER FOR FLEXI CONT (MISCELLANEOUS) ×4 IMPLANT
BINDER ABDOMINAL 10 UNV 27-48 (MISCELLANEOUS) IMPLANT
BINDER ABDOMINAL 12 SM 30-45 (SOFTGOODS) IMPLANT
BINDER BREAST 3XL (BIND) IMPLANT
BINDER BREAST LRG (GAUZE/BANDAGES/DRESSINGS) IMPLANT
BINDER BREAST MEDIUM (GAUZE/BANDAGES/DRESSINGS) IMPLANT
BINDER BREAST XLRG (GAUZE/BANDAGES/DRESSINGS) IMPLANT
BINDER BREAST XXLRG (GAUZE/BANDAGES/DRESSINGS) IMPLANT
BLADE SURG 10 STRL SS (BLADE) ×11 IMPLANT
BLADE SURG 11 STRL SS (BLADE) ×1 IMPLANT
BNDG GAUZE ELAST 4 BULKY (GAUZE/BANDAGES/DRESSINGS) ×8 IMPLANT
CANISTER LIPO FAT HARVEST (MISCELLANEOUS) ×1 IMPLANT
CANISTER SUCT 1200ML W/VALVE (MISCELLANEOUS) ×4 IMPLANT
CHLORAPREP W/TINT 26ML (MISCELLANEOUS) ×7 IMPLANT
COVER BACK TABLE 60X90IN (DRAPES) ×4 IMPLANT
COVER MAYO STAND STRL (DRAPES) ×4 IMPLANT
DECANTER SPIKE VIAL GLASS SM (MISCELLANEOUS) IMPLANT
DRAIN CHANNEL 15F RND FF W/TCR (WOUND CARE) IMPLANT
DRAPE TOP ARMCOVERS (MISCELLANEOUS) ×3 IMPLANT
DRAPE U-SHAPE 76X120 STRL (DRAPES) ×4 IMPLANT
DRSG PAD ABDOMINAL 8X10 ST (GAUZE/BANDAGES/DRESSINGS) ×8 IMPLANT
ELECT BLADE 4.0 EZ CLEAN MEGAD (MISCELLANEOUS) ×4
ELECT COATED BLADE 2.86 ST (ELECTRODE) ×4 IMPLANT
ELECT REM PT RETURN 9FT ADLT (ELECTROSURGICAL) ×4
ELECTRODE BLDE 4.0 EZ CLN MEGD (MISCELLANEOUS) ×2 IMPLANT
ELECTRODE REM PT RTRN 9FT ADLT (ELECTROSURGICAL) ×2 IMPLANT
EVACUATOR SILICONE 100CC (DRAIN) IMPLANT
FILTER LIPOSUCTION (MISCELLANEOUS) ×1 IMPLANT
GLOVE BIO SURGEON STRL SZ 6 (GLOVE) ×8 IMPLANT
GLOVE BIO SURGEON STRL SZ7 (GLOVE) ×6 IMPLANT
GOWN STRL REUS W/ TWL LRG LVL3 (GOWN DISPOSABLE) ×4 IMPLANT
GOWN STRL REUS W/TWL LRG LVL3 (GOWN DISPOSABLE) ×8
IMPL BREAST INSPI SRX 545CC (Breast) ×2 IMPLANT
IMPL BREAST P6.3XRND MDRT 560 (Breast) ×2 IMPLANT
IMPL BRST P6.3XRND MDRT 560CC (Breast) ×2 IMPLANT
IMPLANT BREAST GEL 560CC (Breast) ×4 IMPLANT
IMPLANT BREAST INSPI SRX 545CC (Breast) ×4 IMPLANT
IV NS 500ML (IV SOLUTION)
IV NS 500ML BAXH (IV SOLUTION) IMPLANT
KIT FILL SYSTEM UNIVERSAL (SET/KITS/TRAYS/PACK) IMPLANT
LINER CANISTER 1000CC FLEX (MISCELLANEOUS) ×1 IMPLANT
LIQUID BAND (GAUZE/BANDAGES/DRESSINGS) ×8 IMPLANT
NDL HYPO 25X1 1.5 SAFETY (NEEDLE) IMPLANT
NDL SAFETY ECLIPSE 18X1.5 (NEEDLE) IMPLANT
NEEDLE HYPO 18GX1.5 SHARP (NEEDLE)
NEEDLE HYPO 25X1 1.5 SAFETY (NEEDLE) IMPLANT
NS IRRIG 1000ML POUR BTL (IV SOLUTION) ×4 IMPLANT
PACK BASIN DAY SURGERY FS (CUSTOM PROCEDURE TRAY) ×4 IMPLANT
PAD ALCOHOL SWAB (MISCELLANEOUS) IMPLANT
PENCIL BUTTON HOLSTER BLD 10FT (ELECTRODE) ×4 IMPLANT
PIN SAFETY STERILE (MISCELLANEOUS) ×1 IMPLANT
SHEET MEDIUM DRAPE 40X70 STRL (DRAPES) ×5 IMPLANT
SIZER BREAST REUSE GEL 545CC (SIZER) ×4
SIZER BREAST REUSE GEL 560CC (SIZER) ×4
SIZER BREAST REUSE GEL 580CC (SIZER) ×4
SIZER BRST REUSE GEL 545CC (SIZER) ×1 IMPLANT
SIZER BRST REUSE GEL 560CC (SIZER) ×1 IMPLANT
SIZER BRST REUSE GEL 580CC (SIZER) ×1 IMPLANT
SLEEVE SCD COMPRESS KNEE MED (MISCELLANEOUS) ×4 IMPLANT
SPONGE LAP 18X18 X RAY DECT (DISPOSABLE) ×8 IMPLANT
STAPLER VISISTAT 35W (STAPLE) ×4 IMPLANT
SUT ETHILON 2 0 FS 18 (SUTURE) IMPLANT
SUT MNCRL AB 4-0 PS2 18 (SUTURE) ×8 IMPLANT
SUT PDS AB 2-0 CT2 27 (SUTURE) IMPLANT
SUT VIC AB 3-0 PS1 18 (SUTURE)
SUT VIC AB 3-0 PS1 18XBRD (SUTURE) IMPLANT
SUT VIC AB 3-0 SH 27 (SUTURE) ×12
SUT VIC AB 3-0 SH 27X BRD (SUTURE) ×5 IMPLANT
SUT VICRYL 4-0 PS2 18IN ABS (SUTURE) ×8 IMPLANT
SYR 10ML LL (SYRINGE) IMPLANT
SYR 50ML LL SCALE MARK (SYRINGE) ×4 IMPLANT
SYR BULB IRRIGATION 50ML (SYRINGE) ×8 IMPLANT
SYR CONTROL 10ML LL (SYRINGE) IMPLANT
SYR TB 1ML LL NO SAFETY (SYRINGE) ×1 IMPLANT
TOWEL OR 17X24 6PK STRL BLUE (TOWEL DISPOSABLE) ×8 IMPLANT
TUBE CONNECTING 20'X1/4 (TUBING) ×2
TUBE CONNECTING 20X1/4 (TUBING) ×5 IMPLANT
TUBING INFILTRATION IT-10001 (TUBING) ×1 IMPLANT
TUBING SET GRADUATE ASPIR 12FT (MISCELLANEOUS) ×1 IMPLANT
UNDERPAD 30X30 (UNDERPADS AND DIAPERS) ×8 IMPLANT
YANKAUER SUCT BULB TIP NO VENT (SUCTIONS) ×4 IMPLANT

## 2016-07-28 NOTE — H&P (Signed)
Subjective:     Patient ID: Alexandra Mathis is a 61 y.o. female.  HPI  3 months post op bilateral mastectomies and immediate expander reconstruction. Plan implant exchange next month. Notes right chest is similar to pre mastectomy in bra, left larger and off to side, notes animation.  Presented following screening MMG showing left UOQ calcifications. Compression views of the left breast found pleomorphic calcifications measuring up to 4.0 cm. US demonstrated mass measuring 1.6 cm in the 2:00 position 2 cm from the nipple. An intramammary lymph node was noted with benign appearing. In the left axilla there was a lymph node with focally thickened cortex. Biopsy showed  IDC, ER/PR+, Her 2 +. Biopsy of the left axillary lymph node was negative. MRI demonstrated NME centrally in the left breast spans at least 6 cm with concern for involvement of the left nipple as well. There was a 6 mm enhancing mass in the lower outer quadrant of the right breast. MRI guided biopsy of this was fibrocystic changes. Completed neoadjuvant chemotherapy.Final pathology left breast 1.1 cm IDC with LV invasion, margins negative 0/2 LN. Started on anastrazole and Herceptin to continue through 11/2016.  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 Has sister with history augmentation, a cousin that underwent abdominal reconstruction that had PE and she relates this to length of surgery and minimizing surgery time is a priority.   Prior B cup, would prefer C as this was her size prior to childbirth. Right mastectomy 514 g Left 438 g  Review of Systems     Objective:   Physical Exam  Cardiovascular: Normal rate, regular rhythm and normal heart sounds.   Pulmonary/Chest: Effort normal and breath sounds normal.  Abdominal: Soft.  No hernias   BW R 14 L 14 Left chest with some redundance medial extent scar, bilateral animation, left chest extends onto lateral chest wall more than right, soft tissue pinch on left  > right With arms raised, depression/step off superiorly noted  Assessment:     Left breast ca UOQ Family history breast ca Neadjuvant chemotherapy S/p bilateral SSM, TE/ADM (Alloderm) reconstruction    Plan:   Reviewed that I can never guarantee her a cup size, but understand that she desires larger than preop. Reviewed implants will be larger than expanders, more upper pole fullness than natural breasts. Counseled the animation will not change following this surgery; to address this would need to change location of implant to above muscle. This is possible but longer duration of surgery and would place drains.   Plan implant exchange and possible lipofilling. Discsused saline vs silicone, round vs shaped, textured vs smooth. Provided ALCL ASPS FAQs. Reviewed incidence of ALCL with textured implants, difference between companies, treatment for this. Reviewed benefits of texturing including possible less mobility or displacement implants. Reviewed MRI surveillance for silicone for rupture. Reviewed shared risks rippling, rupture, contracture, infection requiring removal.She has elected for silicone, at this time smooth round.  Reviewed purpose fat grafting to aid with contour, thicken flaps. Discussed donor site incisions, pain, bruising, compression x 6 weeks, risk donor site deformities, variable take graft, fat necrosis that presents as lumps, andneed to repeat procedure. She desires to get back to pre diagnosis weight and lose 30 lb, counseled with this much wt loss she may note loss of volume with fat grafting and we could delay this until she has reached goal wt if desired. Plan no lipofilling at this time.  Discussed she was asymmetric prior to mastectomies  and will not be completely symmetric following this surgery. We discussed that I will choose implant size starting with base width size. Discussed that left may seem larger as the mastectomy flaps is thicker on this side by soft  tissue pinch. Her implant is not displaced laterally but agree fuller in this direction. She notes with raising arms there is depression medially on left by medial extent mastectomy scar- there is some redundancy of skin in this area and can excise this.  Plan OP surgery. Additional risks including but not limited to bleeding, hematoma, seroma, asymmetry, implant displacement DVT/PE, cardiopulmonary complications, unacceptable cosmetic appearance, damage to deeper structures, need for additional surgery reviewed.  Natrelle 133MX-12 T 400 ml tissue expanders placed bilateral,  RIGHT  410 ml total fill volume LEFT 410 ml total fill volume  Irene Limbo, MD [facility] Plastic & Reconstructive Surgery [phone], pin [phone]

## 2016-07-28 NOTE — Anesthesia Postprocedure Evaluation (Signed)
Anesthesia Post Note  Patient: Alexandra Mathis  Procedure(s) Performed: Procedure(s) (LRB): REMOVAL OF BILATERAL TISSUE EXPANDERS WITH PLACEMENT OF BILATERAL SILICONE BREAST IMPLANTS (Bilateral)  Patient location during evaluation: PACU Anesthesia Type: General Level of consciousness: awake and alert Pain management: pain level controlled Vital Signs Assessment: post-procedure vital signs reviewed and stable Respiratory status: spontaneous breathing, nonlabored ventilation, respiratory function stable and patient connected to nasal cannula oxygen Cardiovascular status: blood pressure returned to baseline and stable Postop Assessment: no signs of nausea or vomiting Anesthetic complications: no       Last Vitals:  Vitals:   07/28/16 1035 07/28/16 1038  BP:    Pulse: 81 82  Resp: 16 13  Temp:      Last Pain:  Vitals:   07/28/16 1035  TempSrc:   PainSc: 3                  Tiajuana Amass

## 2016-07-28 NOTE — Anesthesia Procedure Notes (Signed)
Procedure Name: Intubation Date/Time: 07/28/2016 7:36 AM Performed by: Rayvon Char Pre-anesthesia Checklist: Patient identified, Emergency Drugs available, Suction available and Patient being monitored Patient Re-evaluated:Patient Re-evaluated prior to inductionOxygen Delivery Method: Circle system utilized Preoxygenation: Pre-oxygenation with 100% oxygen Intubation Type: IV induction Ventilation: Mask ventilation without difficulty Laryngoscope Size: Miller and 2 Grade View: Grade II Number of attempts: 1 Airway Equipment and Method: Stylet Placement Confirmation: ETT inserted through vocal cords under direct vision Secured at: 22 cm Tube secured with: Tape Dental Injury: Teeth and Oropharynx as per pre-operative assessment

## 2016-07-28 NOTE — Op Note (Addendum)
Operative Note   DATE OF OPERATION: 3.6.18  LOCATION: [facility]-outpatient  SURGICAL DIVISION: Plastic Surgery  PREOPERATIVE DIAGNOSES:  1. History breast cancer 2. Acquired absence breasts  POSTOPERATIVE DIAGNOSES:  same  PROCEDURE:  Removal tissue expanders and placement silicone implants  SURGEON: Irene Limbo MD MBA  ASSISTANT: none  ANESTHESIA:  General.   EBL: 40 ml  COMPLICATIONS: None immediate.   INDICATIONS FOR PROCEDURE:  The patient, Alexandra Mathis, is a 61 y.o. female born on 1955-07-20, is here for second stage breast reconstruction following bilateral skin sparing mastectomies and immediate tissue expander, acellular dermis reconstruction.   FINDINGS: Full incorporation acelluar dermis bilateral. Natrelle Inspira Smooth Round Extra Projection implants placed bilateral. RIGHT 545 ml, REF SRX-545, SN [id number] LEFT 560 ml REF SRX-560 SN [id number]  DESCRIPTION OF PROCEDURE:  The patient's operative site was marked with the patient in the preoperative area to mark sternal notch, chest midline, and anterior axillary lines. The patient was taken to the operating room. SCDs were placed and IV antibiotics were given. A time out was performed and all information was confirmed to be correct. The patient's chest was then prepped and draped in a sterile fashion.   Incision made in right chest mastectomy scar. Incision carried through to pectoralis muscle and capsule. Muscle divided in direction of fibers. Expander removed. Capsulotomies performed superiorly and medially with radial scoring superiorly. Sizer was placed, and I then directed attention to left breast. Medial extent of scar on this side with some fullness noted and ellipse of skin de epithelialized in this area. Additional incision made in lateral extent mastectomy scar and carried to pectoralis muscle. Pectoralis muscle divided in direction of fibers and expander removed. Superior capsulotomies  performed.Sizer placed. Patient brought to upright sitting position and assessed for symmetry. Small amount of skin marked over left by tailor tacking. Natrelle Inspira Smooth Round Extra Projection  implants selected, 545 ml for right chest and 560 ml for left chest.  Patient returned to supine position and area marked by tailor tacking was de epithelialized over left chest. Bilateral cavities irrigated with solution containing Ancef, gentamicin, andbacitracin. Pockest inspected for hemostasis. Cavitiesthen irrigated with Betadine. Implant placed in each cavity and orientation implant confirmed. Closure was completed with running 3-0 vicryl for approximation of pectoralis muscle. 4-0 vicryl was placed in dermis and running 4-0 monocryl was used to close skin. Tissue adhesive was applied to breast incisions. Dry dressing and compression bra applied  The patient was allowed to wake from anesthesia, extubated and taken to the recovery room in satisfactory condition.   SPECIMENS: none  DRAINS: none  Irene Limbo, MD [facility] Plastic & Reconstructive Surgery [phone], pin [phone]

## 2016-07-28 NOTE — Transfer of Care (Signed)
Immediate Anesthesia Transfer of Care Note  Patient: Alexandra Mathis  Procedure(s) Performed: Procedure(s): REMOVAL OF BILATERAL TISSUE EXPANDERS WITH PLACEMENT OF BILATERAL SILICONE BREAST IMPLANTS (Bilateral)  Patient Location: PACU  Anesthesia Type:General  Level of Consciousness: awake, alert  and oriented  Airway & Oxygen Therapy: Patient Spontanous Breathing and Patient connected to face mask oxygen  Post-op Assessment: Report given to RN and Post -op Vital signs reviewed and stable  Post vital signs: Reviewed and stable  Last Vitals:  Vitals:   07/28/16 0650  BP: 110/72  Pulse: 71  Resp: 18  Temp: 36.7 C    Last Pain:  Vitals:   07/28/16 0650  TempSrc: Oral         Complications: No apparent anesthesia complications

## 2016-07-28 NOTE — Discharge Instructions (Signed)

## 2016-07-28 NOTE — Anesthesia Preprocedure Evaluation (Addendum)
Anesthesia Evaluation  Patient identified by MRN, date of birth, ID band Patient awake    Reviewed: Allergy & Precautions, NPO status , Patient's Chart, lab work & pertinent test results  History of Anesthesia Complications (+) PONV  Airway Mallampati: II  TM Distance: >3 FB     Dental  (+) Dental Advisory Given   Pulmonary former smoker,    breath sounds clear to auscultation       Cardiovascular negative cardio ROS   Rhythm:Regular Rate:Normal     Neuro/Psych negative neurological ROS     GI/Hepatic negative GI ROS, Neg liver ROS,   Endo/Other  negative endocrine ROS  Renal/GU negative Renal ROS     Musculoskeletal  (+) Arthritis ,   Abdominal   Peds  Hematology negative hematology ROS (+)   Anesthesia Other Findings   Reproductive/Obstetrics                            Lab Results  Component Value Date   WBC 5.1 07/24/2016   HGB 12.1 07/24/2016   HCT 35.7 07/24/2016   MCV 84.6 07/24/2016   PLT 155 07/24/2016   Lab Results  Component Value Date   CREATININE 0.8 07/24/2016   BUN 19.7 07/24/2016   NA 141 07/24/2016   K 4.1 07/24/2016   CL 103 04/15/2016   CO2 28 07/24/2016    Anesthesia Physical Anesthesia Plan  ASA: II  Anesthesia Plan: General   Post-op Pain Management:    Induction: Intravenous  Airway Management Planned: Oral ETT  Additional Equipment:   Intra-op Plan:   Post-operative Plan: Extubation in OR  Informed Consent: I have reviewed the patients History and Physical, chart, labs and discussed the procedure including the risks, benefits and alternatives for the proposed anesthesia with the patient or authorized representative who has indicated his/her understanding and acceptance.   Dental advisory given  Plan Discussed with: CRNA  Anesthesia Plan Comments:         Anesthesia Quick Evaluation

## 2016-07-29 ENCOUNTER — Encounter (HOSPITAL_BASED_OUTPATIENT_CLINIC_OR_DEPARTMENT_OTHER): Payer: Self-pay | Admitting: Plastic Surgery

## 2016-07-30 ENCOUNTER — Encounter (HOSPITAL_COMMUNITY): Payer: Self-pay

## 2016-08-03 ENCOUNTER — Telehealth (HOSPITAL_COMMUNITY): Payer: Self-pay | Admitting: *Deleted

## 2016-08-03 DIAGNOSIS — C50412 Malignant neoplasm of upper-outer quadrant of left female breast: Secondary | ICD-10-CM

## 2016-08-03 NOTE — Telephone Encounter (Signed)
Dr Haroldine Laws called and discussed echo from Feb with pt, he would like to see pt again w/another echo in early April.  Order placed will get pt scheduled

## 2016-08-05 ENCOUNTER — Ambulatory Visit
Admission: RE | Admit: 2016-08-05 | Discharge: 2016-08-05 | Disposition: A | Discharge status: Home / Self Care | Payer: BC Managed Care – PPO | Source: Ambulatory Visit | Attending: Oncology | Admitting: Oncology

## 2016-08-05 DIAGNOSIS — M8580 Other specified disorders of bone density and structure, unspecified site: Secondary | ICD-10-CM

## 2016-08-05 DIAGNOSIS — C50412 Malignant neoplasm of upper-outer quadrant of left female breast: Secondary | ICD-10-CM

## 2016-08-05 DIAGNOSIS — Z170 Estrogen receptor positive status [ER+]: Principal | ICD-10-CM

## 2016-08-14 ENCOUNTER — Telehealth: Payer: Self-pay | Admitting: Oncology

## 2016-08-14 ENCOUNTER — Other Ambulatory Visit (HOSPITAL_BASED_OUTPATIENT_CLINIC_OR_DEPARTMENT_OTHER): Payer: BC Managed Care – PPO

## 2016-08-14 ENCOUNTER — Ambulatory Visit (HOSPITAL_BASED_OUTPATIENT_CLINIC_OR_DEPARTMENT_OTHER): Payer: BC Managed Care – PPO

## 2016-08-14 ENCOUNTER — Ambulatory Visit: Payer: BC Managed Care – PPO

## 2016-08-14 VITALS — BP 126/80 | HR 80 | Temp 98.7°F | Resp 18

## 2016-08-14 DIAGNOSIS — Z170 Estrogen receptor positive status [ER+]: Secondary | ICD-10-CM

## 2016-08-14 DIAGNOSIS — Z5112 Encounter for antineoplastic immunotherapy: Secondary | ICD-10-CM | POA: Diagnosis not present

## 2016-08-14 DIAGNOSIS — C773 Secondary and unspecified malignant neoplasm of axilla and upper limb lymph nodes: Secondary | ICD-10-CM

## 2016-08-14 DIAGNOSIS — C50412 Malignant neoplasm of upper-outer quadrant of left female breast: Secondary | ICD-10-CM | POA: Diagnosis not present

## 2016-08-14 DIAGNOSIS — Z95828 Presence of other vascular implants and grafts: Secondary | ICD-10-CM

## 2016-08-14 LAB — CBC WITH DIFFERENTIAL/PLATELET
BASO%: 0.4 % (ref 0.0–2.0)
Basophils Absolute: 0 10*3/uL (ref 0.0–0.1)
EOS ABS: 0.1 10*3/uL (ref 0.0–0.5)
EOS%: 1.1 % (ref 0.0–7.0)
HCT: 36 % (ref 34.8–46.6)
HGB: 12.2 g/dL (ref 11.6–15.9)
LYMPH%: 34.1 % (ref 14.0–49.7)
MCH: 28.5 pg (ref 25.1–34.0)
MCHC: 33.9 g/dL (ref 31.5–36.0)
MCV: 84.1 fL (ref 79.5–101.0)
MONO#: 0.4 10*3/uL (ref 0.1–0.9)
MONO%: 9.1 % (ref 0.0–14.0)
NEUT#: 2.5 10*3/uL (ref 1.5–6.5)
NEUT%: 55.3 % (ref 38.4–76.8)
PLATELETS: 187 10*3/uL (ref 145–400)
RBC: 4.28 10*6/uL (ref 3.70–5.45)
RDW: 14.1 % (ref 11.2–14.5)
WBC: 4.5 10*3/uL (ref 3.9–10.3)
lymph#: 1.5 10*3/uL (ref 0.9–3.3)

## 2016-08-14 LAB — COMPREHENSIVE METABOLIC PANEL
ALT: 20 U/L (ref 0–55)
ANION GAP: 9 meq/L (ref 3–11)
AST: 22 U/L (ref 5–34)
Albumin: 4 g/dL (ref 3.5–5.0)
Alkaline Phosphatase: 65 U/L (ref 40–150)
BILIRUBIN TOTAL: 0.55 mg/dL (ref 0.20–1.20)
BUN: 24.3 mg/dL (ref 7.0–26.0)
CHLORIDE: 105 meq/L (ref 98–109)
CO2: 26 meq/L (ref 22–29)
Calcium: 9.7 mg/dL (ref 8.4–10.4)
Creatinine: 0.8 mg/dL (ref 0.6–1.1)
EGFR: 75 mL/min/{1.73_m2} — AB (ref >90)
Glucose: 91 mg/dl (ref 70–140)
Potassium: 4.3 mEq/L (ref 3.5–5.1)
Sodium: 140 mEq/L (ref 136–145)
TOTAL PROTEIN: 7.3 g/dL (ref 6.4–8.3)

## 2016-08-14 MED ORDER — ACETAMINOPHEN 325 MG PO TABS
650.0000 mg | ORAL_TABLET | Freq: Once | ORAL | Status: AC
  Administered 2016-08-14: 650 mg via ORAL

## 2016-08-14 MED ORDER — DIPHENHYDRAMINE HCL 25 MG PO CAPS: 25.0000 mg | ORAL_CAPSULE | Freq: Once | ORAL | Status: DC

## 2016-08-14 MED ORDER — ACETAMINOPHEN 325 MG PO TABS
ORAL_TABLET | ORAL | Status: AC
  Filled 2016-08-14: qty 2

## 2016-08-14 MED ORDER — DIPHENHYDRAMINE HCL 25 MG PO CAPS
ORAL_CAPSULE | ORAL | Status: AC
  Filled 2016-08-14: qty 1

## 2016-08-14 MED ORDER — SODIUM CHLORIDE 0.9 % IJ SOLN
10.0000 mL | INTRAMUSCULAR | Status: DC | PRN
  Administered 2016-08-14: 10 mL via INTRAVENOUS
  Filled 2016-08-14: qty 10

## 2016-08-14 MED ORDER — SODIUM CHLORIDE 0.9% FLUSH
10.0000 mL | INTRAVENOUS | Status: DC | PRN
  Administered 2016-08-14: 10 mL
  Filled 2016-08-14: qty 10

## 2016-08-14 MED ORDER — HEPARIN SOD (PORK) LOCK FLUSH 100 UNIT/ML IV SOLN
500.0000 [IU] | Freq: Once | INTRAVENOUS | Status: AC | PRN
  Administered 2016-08-14: 500 [IU]
  Filled 2016-08-14: qty 5

## 2016-08-14 MED ORDER — SODIUM CHLORIDE 0.9 % IV SOLN
6.0000 mg/kg | Freq: Once | INTRAVENOUS | Status: AC
  Administered 2016-08-14: 441 mg via INTRAVENOUS
  Filled 2016-08-14: qty 21

## 2016-08-14 MED ORDER — SODIUM CHLORIDE 0.9 % IV SOLN
Freq: Once | INTRAVENOUS | Status: AC
  Administered 2016-08-14: 10:00:00 via INTRAVENOUS

## 2016-08-14 NOTE — Patient Instructions (Signed)
[facility] Discharge Instructions for Patients Receiving Chemotherapy  Today you received the following chemotherapy agents: Herceptin   To help prevent nausea and vomiting after your treatment, we encourage you to take your nausea medication as directed.    If you develop nausea and vomiting that is not controlled by your nausea medication, call the clinic.   BELOW ARE SYMPTOMS THAT SHOULD BE REPORTED IMMEDIATELY:  *FEVER GREATER THAN 100.5 F  *CHILLS WITH OR WITHOUT FEVER  NAUSEA AND VOMITING THAT IS NOT CONTROLLED WITH YOUR NAUSEA MEDICATION  *UNUSUAL SHORTNESS OF BREATH  *UNUSUAL BRUISING OR BLEEDING  TENDERNESS IN MOUTH AND THROAT WITH OR WITHOUT PRESENCE OF ULCERS  *URINARY PROBLEMS  *BOWEL PROBLEMS  UNUSUAL RASH Items with * indicate a potential emergency and should be followed up as soon as possible.  Feel free to call the clinic you have any questions or concerns. The clinic phone number is [phone].  Please show the CHEMO ALERT CARD at check-in to the Emergency Department and triage nurse.   

## 2016-08-14 NOTE — Telephone Encounter (Signed)
Called patient to inform her of next scheduled appointments.  

## 2016-08-28 ENCOUNTER — Ambulatory Visit (HOSPITAL_BASED_OUTPATIENT_CLINIC_OR_DEPARTMENT_OTHER)
Admission: RE | Admit: 2016-08-28 | Discharge: 2016-08-28 | Disposition: A | Discharge status: Home / Self Care | Payer: BC Managed Care – PPO | Source: Ambulatory Visit | Attending: Internal Medicine | Admitting: Internal Medicine

## 2016-08-28 ENCOUNTER — Encounter (HOSPITAL_COMMUNITY): Payer: Self-pay | Admitting: Internal Medicine

## 2016-08-28 ENCOUNTER — Ambulatory Visit (HOSPITAL_COMMUNITY)
Admission: RE | Admit: 2016-08-28 | Discharge: 2016-08-28 | Disposition: A | Discharge status: Home / Self Care | Payer: BC Managed Care – PPO | Source: Ambulatory Visit | Attending: Medical | Admitting: Medical

## 2016-08-28 VITALS — BP 120/74 | HR 78 | Wt 172.5 lb

## 2016-08-28 DIAGNOSIS — Z7982 Long term (current) use of aspirin: Secondary | ICD-10-CM | POA: Insufficient documentation

## 2016-08-28 DIAGNOSIS — Z87891 Personal history of nicotine dependence: Secondary | ICD-10-CM | POA: Insufficient documentation

## 2016-08-28 DIAGNOSIS — I340 Nonrheumatic mitral (valve) insufficiency: Secondary | ICD-10-CM | POA: Insufficient documentation

## 2016-08-28 DIAGNOSIS — C50412 Malignant neoplasm of upper-outer quadrant of left female breast: Secondary | ICD-10-CM | POA: Insufficient documentation

## 2016-08-28 DIAGNOSIS — R0609 Other forms of dyspnea: Secondary | ICD-10-CM

## 2016-08-28 DIAGNOSIS — Z885 Allergy status to narcotic agent status: Secondary | ICD-10-CM | POA: Insufficient documentation

## 2016-08-28 DIAGNOSIS — R5383 Other fatigue: Secondary | ICD-10-CM | POA: Diagnosis not present

## 2016-08-28 DIAGNOSIS — Z09 Encounter for follow-up examination after completed treatment for conditions other than malignant neoplasm: Secondary | ICD-10-CM | POA: Diagnosis not present

## 2016-08-28 DIAGNOSIS — E785 Hyperlipidemia, unspecified: Secondary | ICD-10-CM | POA: Insufficient documentation

## 2016-08-28 DIAGNOSIS — Z79899 Other long term (current) drug therapy: Secondary | ICD-10-CM | POA: Diagnosis not present

## 2016-08-28 LAB — ECHOCARDIOGRAM COMPLETE
AVLVOTPG: 4 mmHg
CHL CUP DOP CALC LVOT VTI: 22.6 cm
CHL CUP MV DEC (S): 243
CHL CUP TV REG PEAK VELOCITY: 247 cm/s
E/e' ratio: 6.16
EWDT: 243 ms
FS: 35 % (ref 28–44)
IVS/LV PW RATIO, ED: 1.01
LA diam index: 1.65 cm/m2
LA vol A4C: 46.7 ml
LA vol index: 22.8 mL/m2
LA vol: 42.8 mL
LASIZE: 31 mm
LDCA: 3.46 cm2
LEFT ATRIUM END SYS DIAM: 31 mm
LV E/e' medial: 6.16
LV E/e'average: 6.16
LV e' LATERAL: 12.8 cm/s
LVOT SV: 78 mL
LVOT peak vel: 94.3 cm/s
LVOTD: 21 mm
Lateral S' vel: 14.4 cm/s
MV Peak grad: 2 mmHg
MV pk E vel: 78.8 m/s
MVPKAVEL: 74.1 m/s
PW: 8.26 mm — AB (ref 0.6–1.1)
RV sys press: 27 mmHg
TAPSE: 21.3 mm
TDI e' lateral: 12.8
TDI e' medial: 8.7
TR max vel: 247 cm/s
Weight: 2760 oz

## 2016-08-28 NOTE — Patient Instructions (Signed)
We will contact you in 3 months to schedule your next appointment and echocardiogram  

## 2016-08-28 NOTE — Addendum Note (Signed)
Encounter addended by: Scarlette Calico, RN on: 08/28/2016 10:17 AM<BR>    Actions taken: Sign clinical note

## 2016-08-28 NOTE — Progress Notes (Signed)
CARDIO-ONCOLOGY CLINIC CONSULT NOTE  Referring Physician: Magrinat Cardiologist: Fletcher Anon   HPI:  Ms. Neu is a 61 y/o female with HL, strong family h/o CAD and left breast CA referred by Dr. Jana Hakim for enrollment into the [facility].   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 Biopsy of the left axillary lymph node was negative.  Breast CA: clinical T1c pN0, stage IA  invasive ductal carcinoma, grade 3, estrogen and progesterone receptor positive, with an MIB-1 of 15%, and HER-2 amplification              (1) neoadjuvant chemotherapy consisting of weekly paclitaxel 12 with trastuzumab and pertuzumab every 21 days 4 started 12/27/2015             (a) paclitaxel discontinued after 7 doses because of peripheral neuropathy             (b) received carboplatin/gemcitabine weekly 5 beginning 02/21/2016, last dose 03/27/2016             (c) cycle 3 of carboplatin/Gemzar delayed one week because of thrombocytopenia; carbo dose reduced 10%             (d) cycle 5 carboplatin dose reduced an additional 10% and gemcitabine reduced to 800 mg/m due to thrombocytopenia  (2) trastuzumab to be continued to total of one year (through July 2018)             (a) echo 03/27/2016 showed an ejection fraction in the 60-65 % range.   Has completed chemo. Had double mastectomy with implants place 07/28/16  Now on herceptin alone. Feels ok. Still very tired. Not much energy. SOB with exertion. No edema, orthopnea or PND. Hgb 12.2   Echo 03/27/16: EF 60-65% GLS -20% Echo 08/28/16: RF 55-60% GLS -18.3% LS 12.6 cm/s      Past Medical History:  Diagnosis Date  . Anemia   .  Arthritis    "mostly in my hands; alot in my knees" (04/21/2016)  . Breast cancer of upper-outer quadrant of left female breast ([place]) 12/05/2015  . Chemotherapy management, encounter for 12/27/15 started  . Chest pain   . Family history of premature CAD   . Fibroid   . HLD (hyperlipidemia)   . Neuropathy (HCC)    feet  . Pneumonia 1996; 2006  . PONV (postoperative nausea and vomiting)    scop patch works well    Current Outpatient Prescriptions  Medication Sig Dispense Refill  . anastrozole (ARIMIDEX) 1 MG tablet Take 1 tablet (1 mg total) by mouth daily. 30 tablet 3  . ascorbic acid (VITAMIN C) 250 MG CHEW Chew 500 mg by mouth daily. In the winter months    . aspirin EC 81 MG tablet Take 81 mg by mouth daily.    Marland Kitchen atorvastatin (LIPITOR) 40 MG tablet Take 40 mg by mouth daily.  5  . Cetirizine HCl 10 MG CAPS Take 10 mg by mouth daily as needed. allergies    . Cholecalciferol 1000 units capsule Take 2,000 Units by mouth daily.     Marland Kitchen etodolac (LODINE) 400 MG tablet Take 400 mg by mouth 2 (two) times daily as needed for mild pain.   2  . FIBER COMPLETE  PO Take 5 tablets by mouth daily.     . Multiple Vitamin (MULTIVITAMIN) tablet Take 1 tablet by mouth daily.      . Probiotic Product (ALIGN PO) Take 1 tablet by mouth daily.     Marland Kitchen sulfamethoxazole-trimethoprim (BACTRIM DS,SEPTRA DS) 800-160 MG tablet Take 1 tablet by mouth 2 (two) times daily. 14 tablet 0  . vitamin B-12 (CYANOCOBALAMIN) 500 MCG tablet Take 500 mcg by mouth daily.    . Vitamins C E (CRANBERRY CONCENTRATE PO) Take 1 tablet by mouth daily.     . nitroGLYCERIN (NITROLINGUAL) 0.4 MG/SPRAY spray Place 1 spray under the tongue every 5 (five) minutes x 3 doses as needed for chest pain.     No current facility-administered medications for this encounter.    Facility-Administered Medications Ordered in Other Encounters  Medication Dose Route Frequency Provider Last Rate Last Dose  . sodium chloride 0.9 % injection 10 mL  10 mL  Intravenous PRN Chauncey Cruel, MD   10 mL at 06/15/16 0846    Allergies  Allergen Reactions  . Codeine Itching    Can not tolerate tylenol #3  . Morphine And Related Hives, Nausea And Vomiting and Rash  . Vicodin [Hydrocodone-Acetaminophen] Palpitations      Social History   Social History  . Marital status: Married    Spouse name: N/A  . Number of children: 4  . Years of education: N/A   Occupational History  . teacher    Social History Main Topics  . Smoking status: Former Smoker    Years: 0.25    Types: Cigarettes    Quit date: 05/25/1974  . Smokeless tobacco: Never Used     Comment: was only a social/weekend smoker; smoked for a couple months total  . Alcohol use 0.5 oz/week    1 Standard drinks or equivalent per week     Comment: 04/21/2016 "1 glass wine a few times/year"  . Drug use: No     Comment: last used 25 yrs ago  . Sexual activity: Yes    Birth control/ protection: Surgical, Post-menopausal   Other Topics Concern  . Not on file   Social History Narrative  . No narrative on file      Family History  Problem Relation Age of Onset  . Other Mother     benign meningioma dx 75 s/p surgery; hx of hysterectomy for hemorrhaging  . Hypothyroidism Mother   . CAD Mother   . Colon cancer Father 44    w/ mets to lungs and liver; + chemo; d. 38  . Gout Father   . Arthritis Father   . Ovarian cancer Sister     early 5s; s/p USO  . Breast cancer Sister 70    s/p mastectomy and aromatase inhibitor  . Other Paternal Aunt 44    Primary peritoneal cancer; s/p surgery  . Cancer Paternal Uncle   . Ovarian cancer Sister 93    "pre-cancer"; s/p BSO  . Cervical cancer Sister   . Basal cell carcinoma Sister   . Colon polyps Sister     unspecified number  . Breast cancer Cousin 74    s/p mastectomy  . Ovarian cancer Cousin 21    s/p BSO  . Leukemia Cousin 65  . Cancer Cousin     dx carcinoid bowel tumor  . Other Cousin     reportedly negative "BRCA1/2  testing"  . Breast cancer Cousin 62    reportedly negative GT is 2014-2015  .  CAD Maternal Grandmother     d. 90s  . Heart attack Maternal Grandfather 51  . Heart Problems Paternal Grandmother   . Heart Problems Paternal Grandfather   . CAD Brother     triple vessel CAD, d. 17  . Other Son     enlarged prostate w/ surveillance  . Thyroid cancer Maternal Uncle 71    unspecified type  . CAD Maternal Uncle     d. 82  . Other Cousin 48    dx benign "skin polyp"  . Breast cancer Other     paternal great aunt (PGM's sister)  . Breast cancer Other     paternal great aunt (PGM's sister)    Vitals:   08/28/16 0943  BP: 120/74  Pulse: 78  SpO2: 100%  Weight: 172 lb 8 oz (78.2 kg)    PHYSICAL EXAM: General:  Well appearing. No resp difficulty HEENT: normal Neck: supple. no JVD. Carotids 2+ bilat; no bruits. No lymphadenopathy or thryomegaly appreciated. Cor: PMI nondisplaced. Regular rate & rhythm. No rubs, gallops or murmurs. Lungs: clear Abdomen: soft, nontender, nondistended. No hepatosplenomegaly. No bruits or masses. Good bowel sounds. Extremities: no cyanosis, clubbing, rash, edema Neuro: alert & orientedx3, cranial nerves grossly intact. moves all 4 extremities w/o difficulty. Affect pleasant   ECG: NSR 97 non-specific T wave flattening  ASSESSMENT & PLAN: 1. Left breast CA   --clinical T1c pN0, stage IA  invasive ductal carcinoma, grade 3, estrogen and progesterone receptor positive, with an MIB-1 of 15%, and HER-2 amplification   --now has completed chemo and double mastectomy with reconstruction   --on herceptin alone now   --I reviewed echos personally. EF and Doppler parameters stable. No HF on exam. Continue Herceptin.   2. Dyspnea on exertion and fatigue -- Suspect due to deconditioning -- Encouraged her to continue exercise program and be patient. Let us know if not improving.  Bensimhon, Daniel,MD 10:06 AM

## 2016-08-28 NOTE — Progress Notes (Signed)
  Echocardiogram 2D Echocardiogram has been performed.  Alexandra Mathis 08/28/2016, 9:52 AM

## 2016-08-28 NOTE — Addendum Note (Signed)
Encounter addended by: Scarlette Calico, RN on: 08/28/2016 11:33 AM<BR>    Actions taken: Order list changed, Diagnosis association updated

## 2016-09-02 ENCOUNTER — Other Ambulatory Visit: Payer: Self-pay

## 2016-09-02 MED ORDER — ANASTROZOLE 1 MG PO TABS: 1.0000 mg | ORAL_TABLET | Freq: Every day | ORAL | 3 refills | Status: DC

## 2016-09-04 ENCOUNTER — Ambulatory Visit: Payer: BC Managed Care – PPO

## 2016-09-04 ENCOUNTER — Ambulatory Visit (HOSPITAL_BASED_OUTPATIENT_CLINIC_OR_DEPARTMENT_OTHER): Payer: BC Managed Care – PPO

## 2016-09-04 ENCOUNTER — Other Ambulatory Visit (HOSPITAL_BASED_OUTPATIENT_CLINIC_OR_DEPARTMENT_OTHER): Payer: BC Managed Care – PPO

## 2016-09-04 ENCOUNTER — Other Ambulatory Visit: Payer: BC Managed Care – PPO

## 2016-09-04 ENCOUNTER — Encounter: Payer: Self-pay | Admitting: *Deleted

## 2016-09-04 VITALS — BP 111/77 | HR 70 | Temp 98.5°F

## 2016-09-04 DIAGNOSIS — Z5112 Encounter for antineoplastic immunotherapy: Secondary | ICD-10-CM

## 2016-09-04 DIAGNOSIS — C50412 Malignant neoplasm of upper-outer quadrant of left female breast: Secondary | ICD-10-CM

## 2016-09-04 DIAGNOSIS — Z170 Estrogen receptor positive status [ER+]: Principal | ICD-10-CM

## 2016-09-04 LAB — COMPREHENSIVE METABOLIC PANEL
ALK PHOS: 62 U/L (ref 40–150)
ALT: 23 U/L (ref 0–55)
ANION GAP: 9 meq/L (ref 3–11)
AST: 28 U/L (ref 5–34)
Albumin: 4.2 g/dL (ref 3.5–5.0)
BUN: 29.8 mg/dL — ABNORMAL HIGH (ref 7.0–26.0)
CALCIUM: 9.7 mg/dL (ref 8.4–10.4)
CO2: 28 mEq/L (ref 22–29)
Chloride: 102 mEq/L (ref 98–109)
Creatinine: 0.8 mg/dL (ref 0.6–1.1)
EGFR: 77 mL/min/{1.73_m2} — AB (ref >90)
Glucose: 96 mg/dl (ref 70–140)
Potassium: 4.2 mEq/L (ref 3.5–5.1)
Sodium: 139 mEq/L (ref 136–145)
TOTAL PROTEIN: 7.4 g/dL (ref 6.4–8.3)
Total Bilirubin: 0.63 mg/dL (ref 0.20–1.20)

## 2016-09-04 LAB — CBC WITH DIFFERENTIAL/PLATELET
BASO%: 0.7 % (ref 0.0–2.0)
Basophils Absolute: 0 10*3/uL (ref 0.0–0.1)
EOS ABS: 0 10*3/uL (ref 0.0–0.5)
EOS%: 1 % (ref 0.0–7.0)
HCT: 37.1 % (ref 34.8–46.6)
HGB: 12.6 g/dL (ref 11.6–15.9)
LYMPH#: 1.6 10*3/uL (ref 0.9–3.3)
LYMPH%: 35.9 % (ref 14.0–49.7)
MCH: 28.8 pg (ref 25.1–34.0)
MCHC: 34 g/dL (ref 31.5–36.0)
MCV: 84.7 fL (ref 79.5–101.0)
MONO#: 0.4 10*3/uL (ref 0.1–0.9)
MONO%: 9.4 % (ref 0.0–14.0)
NEUT#: 2.3 10*3/uL (ref 1.5–6.5)
NEUT%: 53 % (ref 38.4–76.8)
PLATELETS: 176 10*3/uL (ref 145–400)
RBC: 4.37 10*6/uL (ref 3.70–5.45)
RDW: 15.2 % — ABNORMAL HIGH (ref 11.2–14.5)
WBC: 4.4 10*3/uL (ref 3.9–10.3)

## 2016-09-04 MED ORDER — SODIUM CHLORIDE 0.9% FLUSH
10.0000 mL | INTRAVENOUS | Status: DC | PRN
  Administered 2016-09-04: 10 mL
  Filled 2016-09-04: qty 10

## 2016-09-04 MED ORDER — ACETAMINOPHEN 325 MG PO TABS
ORAL_TABLET | ORAL | Status: AC
  Filled 2016-09-04: qty 2

## 2016-09-04 MED ORDER — HEPARIN SOD (PORK) LOCK FLUSH 100 UNIT/ML IV SOLN
500.0000 [IU] | Freq: Once | INTRAVENOUS | Status: AC | PRN
  Administered 2016-09-04: 500 [IU]
  Filled 2016-09-04: qty 5

## 2016-09-04 MED ORDER — DIPHENHYDRAMINE HCL 25 MG PO CAPS: 25.0000 mg | ORAL_CAPSULE | Freq: Once | ORAL | Status: DC

## 2016-09-04 MED ORDER — ACETAMINOPHEN 325 MG PO TABS
650.0000 mg | ORAL_TABLET | Freq: Once | ORAL | Status: AC
  Administered 2016-09-04: 650 mg via ORAL

## 2016-09-04 MED ORDER — SODIUM CHLORIDE 0.9 % IV SOLN
Freq: Once | INTRAVENOUS | Status: AC
  Administered 2016-09-04: 09:00:00 via INTRAVENOUS

## 2016-09-04 MED ORDER — TRASTUZUMAB CHEMO 150 MG IV SOLR
6.0000 mg/kg | Freq: Once | INTRAVENOUS | Status: AC
  Administered 2016-09-04: 441 mg via INTRAVENOUS
  Filled 2016-09-04: qty 21

## 2016-09-04 NOTE — Patient Instructions (Signed)
[facility] Discharge Instructions for Patients Receiving Chemotherapy  Today you received the following chemotherapy agents: Herceptin   To help prevent nausea and vomiting after your treatment, we encourage you to take your nausea medication as directed.    If you develop nausea and vomiting that is not controlled by your nausea medication, call the clinic.   BELOW ARE SYMPTOMS THAT SHOULD BE REPORTED IMMEDIATELY:  *FEVER GREATER THAN 100.5 F  *CHILLS WITH OR WITHOUT FEVER  NAUSEA AND VOMITING THAT IS NOT CONTROLLED WITH YOUR NAUSEA MEDICATION  *UNUSUAL SHORTNESS OF BREATH  *UNUSUAL BRUISING OR BLEEDING  TENDERNESS IN MOUTH AND THROAT WITH OR WITHOUT PRESENCE OF ULCERS  *URINARY PROBLEMS  *BOWEL PROBLEMS  UNUSUAL RASH Items with * indicate a potential emergency and should be followed up as soon as possible.  Feel free to call the clinic you have any questions or concerns. The clinic phone number is [phone].  Please show the CHEMO ALERT CARD at check-in to the Emergency Department and triage nurse.   

## 2016-09-18 ENCOUNTER — Telehealth: Payer: Self-pay

## 2016-09-18 NOTE — Telephone Encounter (Signed)
LVM returning pt's call from this morning regarding concern over weight mgt and nail shedding.

## 2016-09-24 ENCOUNTER — Telehealth (HOSPITAL_COMMUNITY): Payer: Self-pay | Admitting: Vascular Surgery

## 2016-09-24 NOTE — Telephone Encounter (Signed)
LEFT PT MESSAGE TO MAKE APPT W/ DB IN July W/ echo

## 2016-09-25 ENCOUNTER — Ambulatory Visit: Payer: BC Managed Care – PPO

## 2016-09-25 ENCOUNTER — Other Ambulatory Visit: Payer: Self-pay | Admitting: *Deleted

## 2016-09-25 ENCOUNTER — Other Ambulatory Visit: Payer: BC Managed Care – PPO

## 2016-09-25 ENCOUNTER — Ambulatory Visit (HOSPITAL_BASED_OUTPATIENT_CLINIC_OR_DEPARTMENT_OTHER): Payer: BC Managed Care – PPO

## 2016-09-25 ENCOUNTER — Other Ambulatory Visit (HOSPITAL_BASED_OUTPATIENT_CLINIC_OR_DEPARTMENT_OTHER): Payer: BC Managed Care – PPO

## 2016-09-25 ENCOUNTER — Ambulatory Visit (HOSPITAL_BASED_OUTPATIENT_CLINIC_OR_DEPARTMENT_OTHER): Payer: BC Managed Care – PPO | Admitting: Oncology

## 2016-09-25 VITALS — BP 114/81 | HR 79 | Temp 98.4°F | Resp 18 | Ht 67.0 in | Wt 166.7 lb

## 2016-09-25 DIAGNOSIS — Z170 Estrogen receptor positive status [ER+]: Principal | ICD-10-CM

## 2016-09-25 DIAGNOSIS — Z5112 Encounter for antineoplastic immunotherapy: Secondary | ICD-10-CM

## 2016-09-25 DIAGNOSIS — C773 Secondary and unspecified malignant neoplasm of axilla and upper limb lymph nodes: Secondary | ICD-10-CM

## 2016-09-25 DIAGNOSIS — C50412 Malignant neoplasm of upper-outer quadrant of left female breast: Secondary | ICD-10-CM

## 2016-09-25 LAB — COMPREHENSIVE METABOLIC PANEL
ALBUMIN: 4.2 g/dL (ref 3.5–5.0)
ALK PHOS: 63 U/L (ref 40–150)
ALT: 21 U/L (ref 0–55)
AST: 22 U/L (ref 5–34)
Anion Gap: 10 mEq/L (ref 3–11)
BILIRUBIN TOTAL: 0.57 mg/dL (ref 0.20–1.20)
BUN: 20.7 mg/dL (ref 7.0–26.0)
CO2: 27 mEq/L (ref 22–29)
CREATININE: 0.8 mg/dL (ref 0.6–1.1)
Calcium: 9.7 mg/dL (ref 8.4–10.4)
Chloride: 104 mEq/L (ref 98–109)
EGFR: 78 mL/min/{1.73_m2} — ABNORMAL LOW (ref >90)
GLUCOSE: 115 mg/dL (ref 70–140)
Potassium: 4.1 mEq/L (ref 3.5–5.1)
SODIUM: 141 meq/L (ref 136–145)
TOTAL PROTEIN: 7.3 g/dL (ref 6.4–8.3)

## 2016-09-25 LAB — CBC WITH DIFFERENTIAL/PLATELET
BASO%: 0.6 % (ref 0.0–2.0)
Basophils Absolute: 0 10*3/uL (ref 0.0–0.1)
EOS ABS: 0 10*3/uL (ref 0.0–0.5)
EOS%: 1.2 % (ref 0.0–7.0)
HCT: 36.7 % (ref 34.8–46.6)
HEMOGLOBIN: 12.5 g/dL (ref 11.6–15.9)
LYMPH%: 37.5 % (ref 14.0–49.7)
MCH: 29.6 pg (ref 25.1–34.0)
MCHC: 34 g/dL (ref 31.5–36.0)
MCV: 87 fL (ref 79.5–101.0)
MONO#: 0.3 10*3/uL (ref 0.1–0.9)
MONO%: 9.6 % (ref 0.0–14.0)
NEUT%: 51.1 % (ref 38.4–76.8)
NEUTROS ABS: 1.8 10*3/uL (ref 1.5–6.5)
Platelets: 162 10*3/uL (ref 145–400)
RBC: 4.22 10*6/uL (ref 3.70–5.45)
RDW: 15.1 % — AB (ref 11.2–14.5)
WBC: 3.5 10*3/uL — AB (ref 3.9–10.3)
lymph#: 1.3 10*3/uL (ref 0.9–3.3)

## 2016-09-25 MED ORDER — TRASTUZUMAB CHEMO 150 MG IV SOLR
6.0000 mg/kg | Freq: Once | INTRAVENOUS | Status: AC
  Administered 2016-09-25: 441 mg via INTRAVENOUS
  Filled 2016-09-25: qty 21

## 2016-09-25 MED ORDER — HEPARIN SOD (PORK) LOCK FLUSH 100 UNIT/ML IV SOLN
500.0000 [IU] | Freq: Once | INTRAVENOUS | Status: AC | PRN
  Administered 2016-09-25: 500 [IU]
  Filled 2016-09-25: qty 5

## 2016-09-25 MED ORDER — SODIUM CHLORIDE 0.9 % IJ SOLN
10.0000 mL | Freq: Once | INTRAMUSCULAR | Status: AC
  Administered 2016-09-25: 10 mL
  Filled 2016-09-25: qty 10

## 2016-09-25 MED ORDER — ACETAMINOPHEN 325 MG PO TABS
650.0000 mg | ORAL_TABLET | Freq: Once | ORAL | Status: AC
  Administered 2016-09-25: 650 mg via ORAL

## 2016-09-25 MED ORDER — SODIUM CHLORIDE 0.9 % IV SOLN
Freq: Once | INTRAVENOUS | Status: AC
  Administered 2016-09-25: 10:00:00 via INTRAVENOUS

## 2016-09-25 MED ORDER — SODIUM CHLORIDE 0.9% FLUSH
10.0000 mL | INTRAVENOUS | Status: DC | PRN
  Administered 2016-09-25: 10 mL
  Filled 2016-09-25: qty 10

## 2016-09-25 MED ORDER — ACETAMINOPHEN 325 MG PO TABS
ORAL_TABLET | ORAL | Status: AC
  Filled 2016-09-25: qty 2

## 2016-09-25 MED ORDER — DIPHENHYDRAMINE HCL 25 MG PO CAPS: 25.0000 mg | ORAL_CAPSULE | Freq: Once | ORAL | Status: DC

## 2016-09-25 NOTE — Patient Instructions (Signed)
[facility] Discharge Instructions for Patients Receiving Chemotherapy  Today you received the following chemotherapy agents: Herceptin   To help prevent nausea and vomiting after your treatment, we encourage you to take your nausea medication as directed.    If you develop nausea and vomiting that is not controlled by your nausea medication, call the clinic.   BELOW ARE SYMPTOMS THAT SHOULD BE REPORTED IMMEDIATELY:  *FEVER GREATER THAN 100.5 F  *CHILLS WITH OR WITHOUT FEVER  NAUSEA AND VOMITING THAT IS NOT CONTROLLED WITH YOUR NAUSEA MEDICATION  *UNUSUAL SHORTNESS OF BREATH  *UNUSUAL BRUISING OR BLEEDING  TENDERNESS IN MOUTH AND THROAT WITH OR WITHOUT PRESENCE OF ULCERS  *URINARY PROBLEMS  *BOWEL PROBLEMS  UNUSUAL RASH Items with * indicate a potential emergency and should be followed up as soon as possible.  Feel free to call the clinic you have any questions or concerns. The clinic phone number is [phone].  Please show the CHEMO ALERT CARD at check-in to the Emergency Department and triage nurse.   

## 2016-09-25 NOTE — Progress Notes (Signed)
Trenda Moots is arrived as perhis arival she went to the tolerating she wasproposed thatshe didn't elaborate [place]  [phone]) [phone] [phone]) [phone]     ID: REBEKAH ZACKERY DOB: 10-Sep-1955  MR#: [id number]  NUU#:[id number]  Patient Care Team: Darrol Jump, PA-C as PCP - General (Family Medicine) Fanny Skates, MD as Consulting Physician (General Surgery) Magrinat, Virgie Dad, MD as Consulting Physician (Oncology) Kyung Rudd, MD as Consulting Physician (Radiation Oncology) Larey Dresser, MD as Consulting Physician (Cardiology) Irene Limbo, MD as Consulting Physician (Plastic Surgery) Megan Salon, MD as Consulting Physician (Gynecology) OTHER MD:  CHIEF COMPLAINT: Triple positive breast cancer  CURRENT TREATMENT: trastuzumab, anastrozole   BREAST CANCER HISTORY: From the original intake note:  "Lesleigh Noe" had screening mammography in June 2017 showing left breast upper outer quadrant calcifications. Accordingly she was referred for left diagnostic mammography and ultrasonography, performed 11/27/2015. The breast density was category C. Compression views of the left breast found pleomorphic calcifications in the upper outer quadrant measuring up to 4.0 cm. There was an area of associated architectural distortion in the upper outer quadrant as well. On physical exam there was an irregular thickening in the left breast at the 2:00 position anteriorly. Ultrasonography confirmed a hypoechoic irregular mass measuring 1.6 cm in the 2:00 position 2 cm from the nipple. An intramammary lymph node was noted with benign appearing. In the left axilla there was a lymph node with focally thickened cortex.  Biopsy of the left breast mass and left axillary lymph node 12/02/2015 found ([id number]), in the breast, invasive ductal carcinoma, grade 3, estrogen receptor 95% positive, progesterone receptor 15% positive, both with strong staining intensity, with an MIB-1 of  15%, and HER-2 amplification, the signals ratio being 2.54 and the number per cell 6.10. Biopsy of the left axillary lymph node was negative.  Her subsequent history is as detailed below  INTERVAL HISTORY: Lesleigh Noe returns today for follow-up of her HER-2/neu positive breast cancer. She continues on trastuzumab, which she tolerates well. She just had a repeat echocardiogram 08/28/2016 which shows a well-preserved ejection fraction.  She is also on anastrozole. She blames her weight gain and mood changes, slight depression on the medication. She also tells me she has a little bit more arthritis in her hands and hips and her right knee as well. Vaginal dryness is not a major issue. She obtains a drug at a good price.   REVIEW OF SYSTEMS: There is hair Has come in very curly. She likes it that way. She is going to let it grow longer. She exercises regularly 3-5 times a week on the elliptical. Of course she is now off for the summer. She tells me that she is on a "fast metabolic carbo sightly in diet. Her use of EtOH is rare. As far as depression is concerned it is not so much that she has a depressed mood is that she finds herself thinking about what happens if I die and other thoughts of that nature. A detailed review of systems today was otherwise stable  PAST MEDICAL HISTORY: Past Medical History:  Diagnosis Date  . Anemia   . Arthritis    "mostly in my hands; alot in my knees" (04/21/2016)  . Breast cancer of upper-outer quadrant of left female breast ([place]) 12/05/2015  . Chemotherapy management, encounter for 12/27/15 started  . Chest pain   . Family history of premature CAD   . Fibroid   . HLD (hyperlipidemia)   . Neuropathy  feet  . Pneumonia 1996; 2006  . PONV (postoperative nausea and vomiting)    scop patch works well    PAST SURGICAL HISTORY: Past Surgical History:  Procedure Laterality Date  . ABDOMINAL HYSTERECTOMY  1990   done with last cesarean section; "still have my  ovaries"  . ANTERIOR CRUCIATE LIGAMENT REPAIR Right   . BREAST BIOPSY Left 2017  . BREAST BIOPSY Right 2017   "MRI guided; benign"  . BREAST RECONSTRUCTION WITH PLACEMENT OF TISSUE EXPANDER AND FLEX HD (ACELLULAR HYDRATED DERMIS) Bilateral 04/21/2016  . BREAST RECONSTRUCTION WITH PLACEMENT OF TISSUE EXPANDER AND FLEX HD (ACELLULAR HYDRATED DERMIS) Bilateral 04/21/2016   Procedure: BREAST RECONSTRUCTION WITH PLACEMENT OF TISSUE EXPANDER AND ALLODERM;  Surgeon: Irene Limbo, MD;  Location: [facility];  Service: Plastics;  Laterality: Bilateral;  . [place]; 1983; 1987; 1990  . CESAREAN SECTION     done with last cesarean section  . CYST EXCISION  2007   on abdomen- 2 cyst removed; "benign"  . DILATION AND CURETTAGE OF UTERUS  ~ 1980; 1989  . GANGLION CYST EXCISION Right ~ 1975  . LAPAROSCOPIC ENDOMETRIOSIS FULGURATION  1989   w/D&C  . MASTECTOMY Bilateral 04/21/2016   LEFT SKIN SPARING MASTECTOMY WITH LEFT SENTINAL LYMPH NODE BIOPSY, RIGHT PROPHYLATIC SKIN SPARING MASTECTOMY   . NIPPLE SPARING MASTECTOMY/SENTINAL LYMPH NODE BIOPSY/RECONSTRUCTION/PLACEMENT OF TISSUE EXPANDER Bilateral 04/21/2016   Procedure: LEFT SKIN SPARING MASTECTOMY WITH LEFT SENTINAL LYMPH NODE BIOPSY, RIGHT PROPHYLATIC SKIN SPARING MASTECTOMY;  Surgeon: Fanny Skates, MD;  Location: [facility];  Service: General;  Laterality: Bilateral;  . PORTACATH PLACEMENT Right 12/25/2015   Procedure: INSERTION PORT-A-CATH WITH [place];  Surgeon: Fanny Skates, MD;  Location: [facility];  Service: General;  Laterality: Right;  . REMOVAL OF BILATERAL TISSUE EXPANDERS WITH PLACEMENT OF BILATERAL BREAST IMPLANTS Bilateral 07/28/2016   Procedure: REMOVAL OF BILATERAL TISSUE EXPANDERS WITH PLACEMENT OF BILATERAL SILICONE BREAST IMPLANTS;  Surgeon: Irene Limbo, MD;  Location: [facility];  Service: Plastics;  Laterality: Bilateral;  . TONSILLECTOMY    . WISDOM TOOTH EXTRACTION  1978    FAMILY HISTORY Family History    Problem Relation Age of Onset  . Other Mother     benign meningioma dx 61 s/p surgery; hx of hysterectomy for hemorrhaging  . Hypothyroidism Mother   . CAD Mother   . Colon cancer Father 56    w/ mets to lungs and liver; + chemo; d. 21  . Gout Father   . Arthritis Father   . Ovarian cancer Sister     early 97s; s/p USO  . Breast cancer Sister 5    s/p mastectomy and aromatase inhibitor  . Other Paternal Aunt 82    Primary peritoneal cancer; s/p surgery  . Cancer Paternal Uncle   . Ovarian cancer Sister 65    "pre-cancer"; s/p BSO  . Cervical cancer Sister   . Basal cell carcinoma Sister   . Colon polyps Sister     unspecified number  . Breast cancer Cousin 83    s/p mastectomy  . Ovarian cancer Cousin 66    s/p BSO  . Leukemia Cousin 50  . Cancer Cousin     dx carcinoid bowel tumor  . Other Cousin     reportedly negative "BRCA1/2 testing"  . Breast cancer Cousin 76    reportedly negative GT is 2014-2015  . CAD Maternal Grandmother     d. 90s  . Heart attack Maternal Grandfather 51  . Heart  Problems Paternal Grandmother   . Heart Problems Paternal Grandfather   . CAD Brother     triple vessel CAD, d. 98  . Other Son     enlarged prostate w/ surveillance  . Thyroid cancer Maternal Uncle 71    unspecified type  . CAD Maternal Uncle     d. 52  . Other Cousin 48    dx benign "skin polyp"  . Breast cancer Other     paternal great aunt (PGM's sister)  . Breast cancer Other     paternal great aunt (PGM's sister)    GYNECOLOGIC HISTORY:  No LMP recorded. Patient has had a hysterectomy. Menarche age 34, first live birth age 38. The patient is GX P4. She stopped having periods in 1989. She did not use hormone replacement. She used oral contraceptives less than one year.  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 Her husband [name] is retired from a [employer]. Son [name] lives in [place] and works as a Chief Strategy Officer. Son [name]  lives in [place] and is with the [employer]. Son [name] this and cocoa [place] where he works as an Chief Financial Officer. Son [name] lives in [place] were he works as an Chief Financial Officer. The patient has 4 grandchildren including a 70-year-old they have custody of and lives with them. The patient attends our [employer]   HEALTH MAINTENANCE: Social History  Substance Use Topics  . Smoking status: Former Smoker    Years: 0.25    Types: Cigarettes    Quit date: 05/25/1974  . Smokeless tobacco: Never Used     Comment: was only a social/weekend smoker; smoked for a couple months total  . Alcohol use 0.5 oz/week    1 Standard drinks or equivalent per week     Comment: 04/21/2016 "1 glass wine a few times/year"     Colonoscopy:  PAP:  Bone density:   Allergies  Allergen Reactions  . Codeine Itching    Can not tolerate tylenol #3  . Morphine And Related Hives, Nausea And Vomiting and Rash  . Vicodin [Hydrocodone-Acetaminophen] Palpitations    Current Outpatient Prescriptions  Medication Sig Dispense Refill  . anastrozole (ARIMIDEX) 1 MG tablet Take 1 tablet (1 mg total) by mouth daily. 30 tablet 3  . ascorbic acid (VITAMIN C) 250 MG CHEW Chew 500 mg by mouth daily. In the winter months    . aspirin EC 81 MG tablet Take 81 mg by mouth daily.    Marland Kitchen atorvastatin (LIPITOR) 40 MG tablet Take 40 mg by mouth daily.  5  . Cetirizine HCl 10 MG CAPS Take 10 mg by mouth daily as needed. allergies    . Cholecalciferol 1000 units capsule Take 2,000 Units by mouth daily.     Marland Kitchen etodolac (LODINE) 400 MG tablet Take 400 mg by mouth 2 (two) times daily as needed for mild pain.   2  . FIBER COMPLETE PO Take 5 tablets by mouth daily.     . Multiple Vitamin (MULTIVITAMIN) tablet Take 1 tablet by mouth daily.      . nitroGLYCERIN (NITROLINGUAL) 0.4 MG/SPRAY spray Place 1 spray under the tongue every 5 (five) minutes x 3 doses as needed for chest pain.    .  Probiotic Product (ALIGN PO) Take 1 tablet by mouth daily.     Marland Kitchen sulfamethoxazole-trimethoprim (BACTRIM DS,SEPTRA DS) 800-160 MG tablet Take 1 tablet by mouth 2 (  two) times daily. 14 tablet 0  . vitamin B-12 (CYANOCOBALAMIN) 500 MCG tablet Take 500 mcg by mouth daily.    . Vitamins C E (CRANBERRY CONCENTRATE PO) Take 1 tablet by mouth daily.      No current facility-administered medications for this visit.    Facility-Administered Medications Ordered in Other Visits  Medication Dose Route Frequency Provider Last Rate Last Dose  . sodium chloride 0.9 % injection 10 mL  10 mL Intravenous PRN Magrinat, Virgie Dad, MD   10 mL at 06/15/16 0846    OBJECTIVE: Middle-aged white woman Who appears stated age  30:   09/25/16 0850  BP: 114/81  Pulse: 79  Resp: 18  Temp: 98.4 F (36.9 C)     Body mass index is 26.11 kg/m.    ECOG FS:1 - Symptomatic but completely ambulatory  Sclerae unicteric, pupils round and equal Oropharynx clear and moist No cervical or supraclavicular adenopathy Lungs no rales or rhonchi Heart regular rate and rhythm Abd soft, nontender, positive bowel sounds MSK no focal spinal tenderness, no upper extremity lymphedema Neuro: nonfocal, well oriented, appropriate affect Breasts: Status post bilateral mastectomies with bilateral implants in place. There is no evidence of local recurrence. Both axillae are benign.    LAB RESULTS:  CMP     Component Value Date/Time   NA 141 09/25/2016 0811   K 4.1 09/25/2016 0811   CL 103 04/15/2016 0942   CO2 27 09/25/2016 0811   GLUCOSE 115 09/25/2016 0811   BUN 20.7 09/25/2016 0811   CREATININE 0.8 09/25/2016 0811   CALCIUM 9.7 09/25/2016 0811   PROT 7.3 09/25/2016 0811   ALBUMIN 4.2 09/25/2016 0811   AST 22 09/25/2016 0811   ALT 21 09/25/2016 0811   ALKPHOS 63 09/25/2016 0811   BILITOT 0.57 09/25/2016 0811   GFRNONAA >60 04/15/2016 0942   GFRAA >60 04/15/2016 0942    INo results found for: SPEP, UPEP  Lab  Results  Component Value Date   WBC 3.5 (L) 09/25/2016   NEUTROABS 1.8 09/25/2016   HGB 12.5 09/25/2016   HCT 36.7 09/25/2016   MCV 87.0 09/25/2016   PLT 162 09/25/2016      Chemistry      Component Value Date/Time   NA 141 09/25/2016 0811   K 4.1 09/25/2016 0811   CL 103 04/15/2016 0942   CO2 27 09/25/2016 0811   BUN 20.7 09/25/2016 0811   CREATININE 0.8 09/25/2016 0811      Component Value Date/Time   CALCIUM 9.7 09/25/2016 0811   ALKPHOS 63 09/25/2016 0811   AST 22 09/25/2016 0811   ALT 21 09/25/2016 0811   BILITOT 0.57 09/25/2016 0811       No results found for: LABCA2  No components found for: GQBVQ945  No results for input(s): INR in the last 168 hours.  Urinalysis No results found for: COLORURINE, APPEARANCEUR, LABSPEC, PHURINE, GLUCOSEU, HGBUR, BILIRUBINUR, KETONESUR, PROTEINUR, UROBILINOGEN, NITRITE, LEUKOCYTESUR   STUDIES: No results found.   ELIGIBLE FOR AVAILABLE RESEARCH PROTOCOL: no  ASSESSMENT: 61 y.o. [place], [place] woman status post left breast upper outer quadrant biopsy 12/02/2015 for a clinical T1c pN0, stage IA  invasive ductal carcinoma, grade 3, estrogen and progesterone receptor positive, with an MIB-1 of 15%, and HER-2 amplification  (a) biopsy of a 0.6 cm lower outer quadrant right breast mass 12/26/2015  (1) neoadjuvant chemotherapy consisting of weekly paclitaxel 12 with trastuzumab and pertuzumab every 21 days 4 started 12/27/2015  (a) paclitaxel discontinued after 7 doses  because of peripheral neuropathy  (b) received carboplatin/gemcitabine weekly 5 beginning 02/21/2016, last dose 03/27/2016  (c) cycle 3 of carboplatin/ delayed one week because of thrombocytopenia; carbo dose reduced 10%  (d) cycle 5 carboplatin dose reduced an additional 10% and gemcitabine reduced to 800 mg/m due to thrombocytopenia  (2) trastuzumab to be continued to total of one year (through July 2018)  (a) echo showed on 07/13/2016 ejection fraction  in the 55 % range.  (b) echocardiogram 08/28/2016 showed an ejection fraction in the 55-60% range  (3) underwent bilateral skin sparing mastectomies with left sentinel lymph node sampling and immediate implant reconstruction 04/21/2016, showing  (a) on the right, no evidence of malignancy  (b) on the left, a residual pT1c pN0, stage IA  invasive ductal carcinoma, grade 2  (c) repeat prognostic panel confirms triple positive disease   (d) negative margins  (4) postmastectomy radiation not indicated   (5) anastrozole started 05/01/2016  (a) baseline bone density 08/05/2016 shows a T score of -0.9 (normal).  (6) genetics testing 02/09/2016 through the 44-gene [facility] Custom Panel performed by [facility], [place]) found no deleterious mutations in APC, ATM, AXIN2, BARD1, BMPR1A, BRCA1, BRCA2, BRIP1, CDH1, CDKN2A, CHEK2, DICER1, EPCAM, GREM1, KIT, MEN1, MLH1, MSH2, MSH6, MUTYH, NBN, NF1, NF2, PALB2, PDGFRA, PMS2, POLD1, POLE, PTEN, RAD50, RAD51C, RAD51D, SDHA, SDHB, SDHC, SDHD, SMAD4, SMARCA4, SMARE1, STK11, TP53, TSC1, TSC2, and VHL  (a)   One variant of uncertain significance (VUS) was found in "c.1691C>G (p.Ala564Gly)" in one copy of the DICER1 gene  (7) planning bilateral salpingo-oophorectomy summer 2018   PLAN: Lesleigh Noe continues to tolerate the Herceptin well and the plan is to continue that through July radial the orders have been entered.  She is doing well with anastrozole. I think the problems she is having with toes 3 and 4 in her left foot may be more due to peroneal nerve problems and that we discussed some exercises she might be able to do to relieve it. There may however be some element of peripheral neuropathy from her earlier chemotherapy. Unfortunately we don't have a solution or even a very good treatment for that.  She was blaming anastrozole for weight gain. Actually she did lose weight she just didn't continue to lose weight. We discussed weight issues for  a good 15 minutes today. She understands that she has already a very good diet and also a very good exercise program but nevertheless she is eating at least as many calories as she is burning. She needs to eat fewer calories if she wants to make any further progress on her weight program.  As far as "depression", but she actually is describing is not so much depression or even posttraumatic stress as thoughts regarding mortality. To me this is an entirely normal human thing and I am glad she is exploring her own feeling some thoughts regarding that. If it becomes intrusive, depressing, if it gets in the way of her enjoyment or activities, then it would be more like posttraumatic stress and we could intervene in that case.  Otherwise she is going to return to see me in July. She will have an echocardiogram that month as well. She will complete her Herceptin that month.  She knows to call for any other problems that may develop before that visit.    Chauncey Cruel, MD   09/27/2016 1:30 PM Medical Oncology and Hematology [facility] [address] [place] [address], [place] [address] Tel. [phone]  Fax. [phone] 100 [address].

## 2016-10-12 ENCOUNTER — Telehealth (HOSPITAL_COMMUNITY): Payer: Self-pay | Admitting: Vascular Surgery

## 2016-10-12 NOTE — Telephone Encounter (Signed)
Left pt message to make f/u appt w/ db w/ echo 

## 2016-10-16 ENCOUNTER — Other Ambulatory Visit: Payer: BC Managed Care – PPO

## 2016-10-16 ENCOUNTER — Ambulatory Visit: Payer: BC Managed Care – PPO | Admitting: Oncology

## 2016-10-16 ENCOUNTER — Ambulatory Visit (HOSPITAL_BASED_OUTPATIENT_CLINIC_OR_DEPARTMENT_OTHER): Payer: BC Managed Care – PPO

## 2016-10-16 ENCOUNTER — Ambulatory Visit: Payer: BC Managed Care – PPO

## 2016-10-16 ENCOUNTER — Other Ambulatory Visit (HOSPITAL_BASED_OUTPATIENT_CLINIC_OR_DEPARTMENT_OTHER): Payer: BC Managed Care – PPO

## 2016-10-16 VITALS — BP 136/84 | HR 75 | Temp 98.4°F | Resp 18

## 2016-10-16 DIAGNOSIS — Z95828 Presence of other vascular implants and grafts: Secondary | ICD-10-CM

## 2016-10-16 DIAGNOSIS — Z5112 Encounter for antineoplastic immunotherapy: Secondary | ICD-10-CM

## 2016-10-16 DIAGNOSIS — Z170 Estrogen receptor positive status [ER+]: Principal | ICD-10-CM

## 2016-10-16 DIAGNOSIS — C50412 Malignant neoplasm of upper-outer quadrant of left female breast: Secondary | ICD-10-CM | POA: Diagnosis not present

## 2016-10-16 DIAGNOSIS — C773 Secondary and unspecified malignant neoplasm of axilla and upper limb lymph nodes: Secondary | ICD-10-CM | POA: Diagnosis not present

## 2016-10-16 LAB — CBC WITH DIFFERENTIAL/PLATELET
BASO%: 0.8 % (ref 0.0–2.0)
BASOS ABS: 0 10*3/uL (ref 0.0–0.1)
EOS%: 1 % (ref 0.0–7.0)
Eosinophils Absolute: 0 10*3/uL (ref 0.0–0.5)
HCT: 37.5 % (ref 34.8–46.6)
HEMOGLOBIN: 12.8 g/dL (ref 11.6–15.9)
LYMPH#: 1.5 10*3/uL (ref 0.9–3.3)
LYMPH%: 31.5 % (ref 14.0–49.7)
MCH: 29.9 pg (ref 25.1–34.0)
MCHC: 34.2 g/dL (ref 31.5–36.0)
MCV: 87.5 fL (ref 79.5–101.0)
MONO#: 0.4 10*3/uL (ref 0.1–0.9)
MONO%: 7.6 % (ref 0.0–14.0)
NEUT#: 2.7 10*3/uL (ref 1.5–6.5)
NEUT%: 59.1 % (ref 38.4–76.8)
Platelets: 173 10*3/uL (ref 145–400)
RBC: 4.29 10*6/uL (ref 3.70–5.45)
RDW: 14.3 % (ref 11.2–14.5)
WBC: 4.6 10*3/uL (ref 3.9–10.3)

## 2016-10-16 LAB — COMPREHENSIVE METABOLIC PANEL
ALT: 20 U/L (ref 0–55)
AST: 26 U/L (ref 5–34)
Albumin: 4.2 g/dL (ref 3.5–5.0)
Alkaline Phosphatase: 68 U/L (ref 40–150)
Anion Gap: 9 mEq/L (ref 3–11)
BUN: 25.2 mg/dL (ref 7.0–26.0)
CALCIUM: 9.9 mg/dL (ref 8.4–10.4)
CHLORIDE: 103 meq/L (ref 98–109)
CO2: 28 mEq/L (ref 22–29)
CREATININE: 0.9 mg/dL (ref 0.6–1.1)
EGFR: 70 mL/min/{1.73_m2} — ABNORMAL LOW (ref >90)
GLUCOSE: 93 mg/dL (ref 70–140)
POTASSIUM: 4.3 meq/L (ref 3.5–5.1)
SODIUM: 140 meq/L (ref 136–145)
Total Bilirubin: 0.46 mg/dL (ref 0.20–1.20)
Total Protein: 7.4 g/dL (ref 6.4–8.3)

## 2016-10-16 MED ORDER — ACETAMINOPHEN 325 MG PO TABS
ORAL_TABLET | ORAL | Status: AC
  Filled 2016-10-16: qty 2

## 2016-10-16 MED ORDER — SODIUM CHLORIDE 0.9 % IJ SOLN
10.0000 mL | INTRAMUSCULAR | Status: DC | PRN
  Administered 2016-10-16: 10 mL via INTRAVENOUS
  Filled 2016-10-16: qty 10

## 2016-10-16 MED ORDER — ACETAMINOPHEN 325 MG PO TABS
650.0000 mg | ORAL_TABLET | Freq: Once | ORAL | Status: AC
  Administered 2016-10-16: 650 mg via ORAL

## 2016-10-16 MED ORDER — TRASTUZUMAB CHEMO 150 MG IV SOLR
450.0000 mg | Freq: Once | INTRAVENOUS | Status: AC
  Administered 2016-10-16: 450 mg via INTRAVENOUS
  Filled 2016-10-16: qty 21.43

## 2016-10-16 MED ORDER — HEPARIN SOD (PORK) LOCK FLUSH 100 UNIT/ML IV SOLN
500.0000 [IU] | Freq: Once | INTRAVENOUS | Status: AC | PRN
  Administered 2016-10-16: 500 [IU]
  Filled 2016-10-16: qty 5

## 2016-10-16 MED ORDER — SODIUM CHLORIDE 0.9% FLUSH
10.0000 mL | INTRAVENOUS | Status: DC | PRN
  Administered 2016-10-16: 10 mL
  Filled 2016-10-16: qty 10

## 2016-10-16 MED ORDER — SODIUM CHLORIDE 0.9 % IV SOLN
Freq: Once | INTRAVENOUS | Status: AC
  Administered 2016-10-16: 09:00:00 via INTRAVENOUS

## 2016-10-16 NOTE — Progress Notes (Signed)
Patient states that she does not take Benadryl.

## 2016-10-16 NOTE — Patient Instructions (Signed)
[facility] Discharge Instructions for Patients Receiving Chemotherapy  Today you received the following chemotherapy agents: Herceptin   To help prevent nausea and vomiting after your treatment, we encourage you to take your nausea medication as directed.    If you develop nausea and vomiting that is not controlled by your nausea medication, call the clinic.   BELOW ARE SYMPTOMS THAT SHOULD BE REPORTED IMMEDIATELY:  *FEVER GREATER THAN 100.5 F  *CHILLS WITH OR WITHOUT FEVER  NAUSEA AND VOMITING THAT IS NOT CONTROLLED WITH YOUR NAUSEA MEDICATION  *UNUSUAL SHORTNESS OF BREATH  *UNUSUAL BRUISING OR BLEEDING  TENDERNESS IN MOUTH AND THROAT WITH OR WITHOUT PRESENCE OF ULCERS  *URINARY PROBLEMS  *BOWEL PROBLEMS  UNUSUAL RASH Items with * indicate a potential emergency and should be followed up as soon as possible.  Feel free to call the clinic you have any questions or concerns. The clinic phone number is [phone].  Please show the CHEMO ALERT CARD at check-in to the Emergency Department and triage nurse.   

## 2016-10-16 NOTE — Patient Instructions (Signed)

## 2016-10-21 ENCOUNTER — Telehealth: Payer: Self-pay | Admitting: Obstetrics & Gynecology

## 2016-10-21 NOTE — Telephone Encounter (Signed)
Called patient and left a message to call our office directly re:  MyChart message Appointment Request From: Alexandra Mathis    With Provider: Lyman Speller, MD [facility] [place]    Preferred Date Range: Any date 11/04/2016 or later    Preferred Times: Any    Reason for visit: Office Visit    Comments:  I called a month ago to change my June 15th appointment to June 13th. Please let me know if you can do it. I have a trip and won't be able to do the 15th which is why I called a month ago.  Thank you.   Patient does not have an appointment at this time. Please confirm what she needs to be seen for and get her scheduled.

## 2016-10-22 ENCOUNTER — Encounter: Payer: Self-pay | Admitting: Obstetrics & Gynecology

## 2016-10-22 ENCOUNTER — Telehealth: Payer: Self-pay | Admitting: Oncology

## 2016-10-22 NOTE — Telephone Encounter (Signed)
Routing to Lamont Snowball, RN for review and advise?  Last OV with Dr. Sabra Heck, 03/26/16.

## 2016-10-22 NOTE — Telephone Encounter (Signed)
Patient called and wanted to move her next treatment day 6/15 to 6/12 she will be out of town on 6/15. She said she had called before to get it changed and was told it was done. And she looked at [facility] and it was not changed

## 2016-10-22 NOTE — Telephone Encounter (Signed)
Patient

## 2016-10-22 NOTE — Telephone Encounter (Signed)
Patient is asking to talk with someone today about scheduling her surgery. Patient really wants to get this scheduled soon due to her limited schedule. Patient would like have surgery July 9h through July 20th, however not available July 11th. Patient is ready anxious to talk with someone today. Patient is asking if she needs to schedule a consult with Dr.Miller.

## 2016-10-22 NOTE — Telephone Encounter (Signed)
I am not able to reschedule infusions

## 2016-10-22 NOTE — Telephone Encounter (Signed)
I SUBMITTED A SCHEDULING MESSAGE BECAUSE Alexandra Mathis IS UNABLE TO SCHEDULE INFUSIONS.

## 2016-10-22 NOTE — Telephone Encounter (Signed)
Return call to patient. Last here for pelvic ultrasound 03-2016. Advised patient would need office visit to discuss with Dr Sabra Heck and assess medical status before scheduling.  Patient has very narrow window of two weeks in July when she needs surgery done due to continuation of Herceptin treatment.  Advised that usually do not schedule surgery while treatments are still on-going and may need several weeks after completed for immune system to recover. Patient states she is only receiving Herceptin.  Patient declines office visit to discuss with Dr Sabra Heck. States she will call Dr Jana Hakim directly and have him contact Dr Sabra Heck.   Routing to Dr Sabra Heck for review.

## 2016-10-22 NOTE — Telephone Encounter (Signed)
Are you able to reschedule her? Please reschedule.Thanks

## 2016-10-22 NOTE — Telephone Encounter (Signed)
Due to pt's hx, feel this pt will benefit from Dr. Denman George doing surgery.  Please schedule appt with her and let them know about her window for surgery is July 9th and July 20th.

## 2016-10-22 NOTE — Telephone Encounter (Signed)
Patient said that she discussed having her ovaries removed last year with Dr.Miller. Patient is ready to proceed. Patient is interested in having this done in July.

## 2016-10-22 NOTE — Telephone Encounter (Signed)
Routing to Sally Yeakley, RN for review and advise. 

## 2016-10-23 NOTE — Telephone Encounter (Signed)
Call to Gyn/Onc. Appointment with Dr Denman George on 11-16-16 at [place].

## 2016-10-23 NOTE — Telephone Encounter (Signed)
Call to patient. Advised of recommendation from Dr Sabra Heck for [facility] referral for surgery. Patietn states she discussed this with Dr Sabra Heck and is agreeable. 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 If not available, needs after 11-16-16.

## 2016-10-23 NOTE — Telephone Encounter (Signed)
Call to patient. Advised of appointment date and time with Dr Denman George. Instructions and phone number given.    Routing to provider for final review. Patient agreeable to disposition. Will close encounter.

## 2016-11-03 ENCOUNTER — Ambulatory Visit: Payer: BC Managed Care – PPO

## 2016-11-03 ENCOUNTER — Other Ambulatory Visit (HOSPITAL_BASED_OUTPATIENT_CLINIC_OR_DEPARTMENT_OTHER): Payer: BC Managed Care – PPO

## 2016-11-03 ENCOUNTER — Ambulatory Visit (HOSPITAL_BASED_OUTPATIENT_CLINIC_OR_DEPARTMENT_OTHER): Payer: BC Managed Care – PPO

## 2016-11-03 VITALS — BP 128/74 | HR 74 | Temp 98.4°F | Resp 16

## 2016-11-03 DIAGNOSIS — Z5112 Encounter for antineoplastic immunotherapy: Secondary | ICD-10-CM

## 2016-11-03 DIAGNOSIS — C50412 Malignant neoplasm of upper-outer quadrant of left female breast: Secondary | ICD-10-CM | POA: Diagnosis not present

## 2016-11-03 DIAGNOSIS — C773 Secondary and unspecified malignant neoplasm of axilla and upper limb lymph nodes: Secondary | ICD-10-CM | POA: Diagnosis not present

## 2016-11-03 DIAGNOSIS — Z170 Estrogen receptor positive status [ER+]: Principal | ICD-10-CM

## 2016-11-03 LAB — COMPREHENSIVE METABOLIC PANEL
ALT: 21 U/L (ref 0–55)
AST: 24 U/L (ref 5–34)
Albumin: 3.9 g/dL (ref 3.5–5.0)
Alkaline Phosphatase: 68 U/L (ref 40–150)
Anion Gap: 9 mEq/L (ref 3–11)
BUN: 14.4 mg/dL (ref 7.0–26.0)
CHLORIDE: 106 meq/L (ref 98–109)
CO2: 30 meq/L — AB (ref 22–29)
CREATININE: 0.8 mg/dL (ref 0.6–1.1)
Calcium: 9.8 mg/dL (ref 8.4–10.4)
EGFR: 78 mL/min/{1.73_m2} — ABNORMAL LOW (ref >90)
GLUCOSE: 90 mg/dL (ref 70–140)
POTASSIUM: 3.9 meq/L (ref 3.5–5.1)
Sodium: 144 mEq/L (ref 136–145)
Total Bilirubin: 0.39 mg/dL (ref 0.20–1.20)
Total Protein: 7 g/dL (ref 6.4–8.3)

## 2016-11-03 LAB — CBC WITH DIFFERENTIAL/PLATELET
BASO%: 0.7 % (ref 0.0–2.0)
Basophils Absolute: 0 10*3/uL (ref 0.0–0.1)
EOS%: 1.3 % (ref 0.0–7.0)
Eosinophils Absolute: 0 10*3/uL (ref 0.0–0.5)
HCT: 36.7 % (ref 34.8–46.6)
HEMOGLOBIN: 12.3 g/dL (ref 11.6–15.9)
LYMPH%: 41.6 % (ref 14.0–49.7)
MCH: 29.6 pg (ref 25.1–34.0)
MCHC: 33.6 g/dL (ref 31.5–36.0)
MCV: 88.2 fL (ref 79.5–101.0)
MONO#: 0.3 10*3/uL (ref 0.1–0.9)
MONO%: 8.2 % (ref 0.0–14.0)
NEUT#: 1.6 10*3/uL (ref 1.5–6.5)
NEUT%: 48.2 % (ref 38.4–76.8)
Platelets: 155 10*3/uL (ref 145–400)
RBC: 4.16 10*6/uL (ref 3.70–5.45)
RDW: 13.7 % (ref 11.2–14.5)
WBC: 3.3 10*3/uL — ABNORMAL LOW (ref 3.9–10.3)
lymph#: 1.4 10*3/uL (ref 0.9–3.3)

## 2016-11-03 MED ORDER — ACETAMINOPHEN 325 MG PO TABS
ORAL_TABLET | ORAL | Status: AC
  Filled 2016-11-03: qty 2

## 2016-11-03 MED ORDER — SODIUM CHLORIDE 0.9 % IV SOLN
Freq: Once | INTRAVENOUS | Status: AC
  Administered 2016-11-03: 14:00:00 via INTRAVENOUS

## 2016-11-03 MED ORDER — SODIUM CHLORIDE 0.9% FLUSH
10.0000 mL | INTRAVENOUS | Status: DC | PRN
  Administered 2016-11-03: 10 mL
  Filled 2016-11-03: qty 10

## 2016-11-03 MED ORDER — TRASTUZUMAB CHEMO 150 MG IV SOLR
450.0000 mg | Freq: Once | INTRAVENOUS | Status: AC
  Administered 2016-11-03: 450 mg via INTRAVENOUS
  Filled 2016-11-03: qty 21.43

## 2016-11-03 MED ORDER — DIPHENHYDRAMINE HCL 25 MG PO CAPS: 25.0000 mg | ORAL_CAPSULE | Freq: Once | ORAL | Status: DC

## 2016-11-03 MED ORDER — HEPARIN SOD (PORK) LOCK FLUSH 100 UNIT/ML IV SOLN
500.0000 [IU] | Freq: Once | INTRAVENOUS | Status: AC | PRN
  Administered 2016-11-03: 500 [IU]
  Filled 2016-11-03: qty 5

## 2016-11-03 MED ORDER — ACETAMINOPHEN 325 MG PO TABS
650.0000 mg | ORAL_TABLET | Freq: Once | ORAL | Status: AC
  Administered 2016-11-03: 650 mg via ORAL

## 2016-11-03 NOTE — Patient Instructions (Signed)
[facility] Discharge Instructions for Patients Receiving Chemotherapy  Today you received the following chemotherapy agents herceptin   To help prevent nausea and vomiting after your treatment, we encourage you to take your nausea medication as directed   If you develop nausea and vomiting that is not controlled by your nausea medication, call the clinic.   BELOW ARE SYMPTOMS THAT SHOULD BE REPORTED IMMEDIATELY:  *FEVER GREATER THAN 100.5 F  *CHILLS WITH OR WITHOUT FEVER  NAUSEA AND VOMITING THAT IS NOT CONTROLLED WITH YOUR NAUSEA MEDICATION  *UNUSUAL SHORTNESS OF BREATH  *UNUSUAL BRUISING OR BLEEDING  TENDERNESS IN MOUTH AND THROAT WITH OR WITHOUT PRESENCE OF ULCERS  *URINARY PROBLEMS  *BOWEL PROBLEMS  UNUSUAL RASH Items with * indicate a potential emergency and should be followed up as soon as possible.  Feel free to call the clinic you have any questions or concerns. The clinic phone number is [phone].  

## 2016-11-03 NOTE — Patient Instructions (Signed)

## 2016-11-06 ENCOUNTER — Ambulatory Visit: Payer: BC Managed Care – PPO

## 2016-11-06 ENCOUNTER — Other Ambulatory Visit: Payer: BC Managed Care – PPO

## 2016-11-16 ENCOUNTER — Ambulatory Visit: Payer: BC Managed Care – PPO | Attending: Gynecologic Oncology | Admitting: Gynecologic Oncology

## 2016-11-16 ENCOUNTER — Encounter: Payer: Self-pay | Admitting: Gynecologic Oncology

## 2016-11-16 VITALS — BP 131/87 | HR 83 | Temp 98.1°F | Resp 18 | Ht 67.0 in | Wt 163.2 lb

## 2016-11-16 DIAGNOSIS — Z8249 Family history of ischemic heart disease and other diseases of the circulatory system: Secondary | ICD-10-CM | POA: Diagnosis not present

## 2016-11-16 DIAGNOSIS — Z853 Personal history of malignant neoplasm of breast: Secondary | ICD-10-CM

## 2016-11-16 DIAGNOSIS — Z803 Family history of malignant neoplasm of breast: Secondary | ICD-10-CM | POA: Insufficient documentation

## 2016-11-16 DIAGNOSIS — Z8371 Family history of colonic polyps: Secondary | ICD-10-CM | POA: Insufficient documentation

## 2016-11-16 DIAGNOSIS — Q935 Other deletions of part of a chromosome: Secondary | ICD-10-CM | POA: Diagnosis not present

## 2016-11-16 DIAGNOSIS — Z1502 Genetic susceptibility to malignant neoplasm of ovary: Secondary | ICD-10-CM

## 2016-11-16 DIAGNOSIS — N800 Endometriosis of uterus: Secondary | ICD-10-CM | POA: Diagnosis not present

## 2016-11-16 DIAGNOSIS — Z90710 Acquired absence of both cervix and uterus: Secondary | ICD-10-CM | POA: Diagnosis not present

## 2016-11-16 DIAGNOSIS — Z8041 Family history of malignant neoplasm of ovary: Secondary | ICD-10-CM

## 2016-11-16 DIAGNOSIS — Z9889 Other specified postprocedural states: Secondary | ICD-10-CM | POA: Insufficient documentation

## 2016-11-16 DIAGNOSIS — Z9221 Personal history of antineoplastic chemotherapy: Secondary | ICD-10-CM | POA: Insufficient documentation

## 2016-11-16 DIAGNOSIS — Z808 Family history of malignant neoplasm of other organs or systems: Secondary | ICD-10-CM | POA: Insufficient documentation

## 2016-11-16 DIAGNOSIS — Z9013 Acquired absence of bilateral breasts and nipples: Secondary | ICD-10-CM | POA: Insufficient documentation

## 2016-11-16 DIAGNOSIS — Z885 Allergy status to narcotic agent status: Secondary | ICD-10-CM | POA: Insufficient documentation

## 2016-11-16 DIAGNOSIS — Z87891 Personal history of nicotine dependence: Secondary | ICD-10-CM | POA: Insufficient documentation

## 2016-11-16 DIAGNOSIS — E785 Hyperlipidemia, unspecified: Secondary | ICD-10-CM | POA: Insufficient documentation

## 2016-11-16 DIAGNOSIS — Z1589 Genetic susceptibility to other disease: Secondary | ICD-10-CM

## 2016-11-16 DIAGNOSIS — Z7982 Long term (current) use of aspirin: Secondary | ICD-10-CM | POA: Diagnosis not present

## 2016-11-16 DIAGNOSIS — Z79899 Other long term (current) drug therapy: Secondary | ICD-10-CM | POA: Diagnosis not present

## 2016-11-16 DIAGNOSIS — Z1509 Genetic susceptibility to other malignant neoplasm: Secondary | ICD-10-CM | POA: Insufficient documentation

## 2016-11-16 NOTE — Patient Instructions (Signed)
Preparing for your Surgery  Plan for surgery on January 05, 2017 with Dr. Everitt Amber at [facility] will be scheduled for a robotic assisted bilateral salpingo-oophorectomy.  Pre-operative Testing -You will receive a phone call from presurgical testing at [facility] to arrange for a pre-operative testing appointment before your surgery.  This appointment normally occurs one to two weeks before your scheduled surgery.   -Bring your insurance card, copy of an advanced directive if applicable, medication list  -At that visit, you will be asked to sign a consent for a possible blood transfusion in case a transfusion becomes necessary during surgery.  The need for a blood transfusion is rare but having consent is a necessary part of your care.     -You should not be taking blood thinners or aspirin at least ten days prior to surgery unless instructed by your surgeon.  Day Before Surgery at [place] will be asked to take in a light diet the day before surgery.  Avoid carbonated beverages.  You will be advised to have nothing to eat or drink after midnight the evening before.     Eat a light diet the day before surgery.  Examples including soups, broths, toast, yogurt, mashed potatoes.  Things to avoid include carbonated beverages (fizzy beverages), raw fruits and raw vegetables, or beans.    If your bowels are filled with gas, your surgeon will have difficulty visualizing your pelvic organs which increases your surgical risks.  Your role in recovery Your role is to become active as soon as directed by your doctor, while still giving yourself time to heal.  Rest when you feel tired. You will be asked to do the following in order to speed your recovery:  - Cough and breathe deeply. This helps toclear and expand your lungs and can prevent pneumonia. You may be given a spirometer to practice deep breathing. A staff member will show you how to use the spirometer. -  Do mild physical activity. Walking or moving your legs help your circulation and body functions return to normal. A staff member will help you when you try to walk and will provide you with simple exercises. Do not try to get up or walk alone the first time. - Actively manage your pain. Managing your pain lets you move in comfort. We will ask you to rate your pain on a scale of zero to 10. It is your responsibility to tell your doctor or nurse where and how much you hurt so your pain can be treated.  Special Considerations -If you are diabetic, you may be placed on insulin after surgery to have closer control over your blood sugars to promote healing and recovery.  This does not mean that you will be discharged on insulin.  If applicable, your oral antidiabetics will be resumed when you are tolerating a solid diet.  -Your final pathology results from surgery should be available by the Friday after surgery and the results will be relayed to you when available.   Blood Transfusion Information WHAT IS A BLOOD TRANSFUSION? A transfusion is the replacement of blood or some of its parts. Blood is made up of multiple cells which provide different functions.  Red blood cells carry oxygen and are used for blood loss replacement.  White blood cells fight against infection.  Platelets control bleeding.  Plasma helps clot blood.  Other blood products are available for specialized needs, such as hemophilia or other clotting disorders. BEFORE THE  TRANSFUSION  Who gives blood for transfusions?   You may be able to donate blood to be used at a later date on yourself (autologous donation).  Relatives can be asked to donate blood. This is generally not any safer than if you have received blood from a stranger. The same precautions are taken to ensure safety when a relative's blood is donated.  Healthy volunteers who are fully evaluated to make sure their blood is safe. This is blood bank  blood. Transfusion therapy is the safest it has ever been in the practice of medicine. Before blood is taken from a donor, a complete history is taken to make sure that person has no history of diseases nor engages in risky social behavior (examples are intravenous drug use or sexual activity with multiple partners). The donor's travel history is screened to minimize risk of transmitting infections, such as malaria. The donated blood is tested for signs of infectious diseases, such as HIV and hepatitis. The blood is then tested to be sure it is compatible with you in order to minimize the chance of a transfusion reaction. If you or a relative donates blood, this is often done in anticipation of surgery and is not appropriate for emergency situations. It takes many days to process the donated blood. RISKS AND COMPLICATIONS Although transfusion therapy is very safe and saves many lives, the main dangers of transfusion include:   Getting an infectious disease.  Developing a transfusion reaction. This is an allergic reaction to something in the blood you were given. Every precaution is taken to prevent this. The decision to have a blood transfusion has been considered carefully by your caregiver before blood is given. Blood is not given unless the benefits outweigh the risks.

## 2016-11-16 NOTE — Progress Notes (Signed)
Consult Note: Gyn-Onc  Consult was requested by Dr. Jana Hakim and Dr Sabra Heck for the evaluation of Alexandra Mathis 61 y.o. female  CC:  Chief Complaint  Patient presents with  . Family History of Breast Cancer  Deleterious mutation of DICER1  Assessment/Plan:  Alexandra Mathis  is a 61 y.o.  year old with a deleterious mutation of DICER1 gene, a personal history of breast cancer who desires risk reducing BSO.  I discussed with the patient that the [place] mutation carries an unknown risk for ovarian cancer (possibly approximately 1 in 87). I discussed that BSO was associated with risk for  bleeding, infection, damage to internal organs (such as bladder,ureters, bowels), blood clot, reoperation and rehospitalization. These risks are higher for her due to her history of prior surgeries (multiple). I discussed that BSO prior to age 60 is also associated with increased all cause mortality, particularly for cardiovascular events.   She is electing for surgery in August. We discussed that she should stop ASA 10 to 14 days preop and discussed postop recovery.  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  The patient has a remote history of adenomyosis and endometriosis (had multiple laparoscopies). She had 4 cesarean sections. At the time of her 4th she had a cesarean hysterectomy.  She subsequently developed breast cancer at age 18. Her tumor was stage I and ER/PR/Her2Neu positive. She was treated with chemotherapy and surgery (bilateral mastectomies). She is completing Herceptin therapy at present. Her cousin has a strong family history of cancer (breast, colon, uterine). The patient was tested with genetics and found to have a mutation in DICER1 of uncertain significance.  [place] on 03/26/16 showed normal ovaries bilaterally.    Current Meds:  Outpatient Encounter Prescriptions as of 11/16/2016  Medication Sig  . anastrozole (ARIMIDEX) 1 MG tablet Take 1 tablet (1 mg total) by mouth daily.  Marland Kitchen ascorbic acid (VITAMIN C) 250 MG CHEW Chew 500 mg by mouth daily. In the winter months  . aspirin EC 81 MG tablet Take 81 mg by mouth daily.  Marland Kitchen atorvastatin (LIPITOR) 40 MG tablet Take 40 mg by mouth daily.  . Cetirizine HCl 10 MG CAPS Take 10 mg by mouth daily as needed. allergies  . Cholecalciferol 1000 units capsule Take 2,000 Units by mouth daily.   Marland Kitchen etodolac (LODINE) 400 MG tablet Take 400 mg by mouth 2 (two) times daily as needed for mild pain.   Marland Kitchen FIBER COMPLETE PO Take 5 tablets by mouth daily.   . Multiple Vitamin (MULTIVITAMIN) tablet Take 1 tablet by mouth daily.    . nitroGLYCERIN (NITROLINGUAL) 0.4 MG/SPRAY spray Place 1 spray under the tongue every 5 (five) minutes x 3 doses as needed for chest pain.  . Probiotic Product (ALIGN PO) Take 1 tablet by mouth daily.   Marland Kitchen sulfamethoxazole-trimethoprim (BACTRIM DS,SEPTRA DS) 800-160 MG tablet Take 1 tablet by mouth 2 (two) times daily.  . vitamin B-12 (CYANOCOBALAMIN) 500 MCG tablet Take 500 mcg by mouth daily.  . Vitamins C E (CRANBERRY CONCENTRATE PO) Take 1 tablet by mouth daily.    Facility-Administered Encounter Medications as of 11/16/2016  Medication  . sodium chloride 0.9 % injection 10 mL    Allergy:  Allergies  Allergen Reactions  . Codeine Itching    Can not  tolerate tylenol #3  . Morphine And Related Hives, Nausea And Vomiting and Rash  . Vicodin [Hydrocodone-Acetaminophen] Palpitations    Social Hx:   Social History   Social History  . Marital status: Married    Spouse name: N/A  . Number of children: 4  . Years of education: N/A   Occupational History  . teacher    Social History Main Topics  . Smoking status: Former Smoker    Years: 0.25    Types: Cigarettes    Quit date: 05/25/1974  . Smokeless tobacco: Never Used     Comment: was  only a social/weekend smoker; smoked for a couple months total  . Alcohol use 0.5 oz/week    1 Standard drinks or equivalent per week     Comment: 04/21/2016 "1 glass wine a few times/year"  . Drug use: No     Comment: last used 25 yrs ago  . Sexual activity: Yes    Birth control/ protection: Surgical, Post-menopausal   Other Topics Concern  . Not on file   Social History Narrative  . No narrative on file    Past Surgical Hx:  Past Surgical History:  Procedure Laterality Date  . ABDOMINAL HYSTERECTOMY  1990   done with last cesarean section; "still have my ovaries"  . ANTERIOR CRUCIATE LIGAMENT REPAIR Right   . BREAST BIOPSY Left 2017  . BREAST BIOPSY Right 2017   "MRI guided; benign"  . BREAST RECONSTRUCTION WITH PLACEMENT OF TISSUE EXPANDER AND FLEX HD (ACELLULAR HYDRATED DERMIS) Bilateral 04/21/2016  . BREAST RECONSTRUCTION WITH PLACEMENT OF TISSUE EXPANDER AND FLEX HD (ACELLULAR HYDRATED DERMIS) Bilateral 04/21/2016   Procedure: BREAST RECONSTRUCTION WITH PLACEMENT OF TISSUE EXPANDER AND ALLODERM;  Surgeon: Irene Limbo, MD;  Location: [facility];  Service: Plastics;  Laterality: Bilateral;  . [place]; 1983; 1987; 1990  . CESAREAN SECTION     done with last cesarean section  . CYST EXCISION  2007   on abdomen- 2 cyst removed; "benign"  . DILATION AND CURETTAGE OF UTERUS  ~ 1980; 1989  . GANGLION CYST EXCISION Right ~ 1975  . LAPAROSCOPIC ENDOMETRIOSIS FULGURATION  1989   w/D&C  . MASTECTOMY Bilateral 04/21/2016   LEFT SKIN SPARING MASTECTOMY WITH LEFT SENTINAL LYMPH NODE BIOPSY, RIGHT PROPHYLATIC SKIN SPARING MASTECTOMY   . NIPPLE SPARING MASTECTOMY/SENTINAL LYMPH NODE BIOPSY/RECONSTRUCTION/PLACEMENT OF TISSUE EXPANDER Bilateral 04/21/2016   Procedure: LEFT SKIN SPARING MASTECTOMY WITH LEFT SENTINAL LYMPH NODE BIOPSY, RIGHT PROPHYLATIC SKIN SPARING MASTECTOMY;  Surgeon: Fanny Skates, MD;  Location: [facility];  Service: General;  Laterality: Bilateral;  .  PORTACATH PLACEMENT Right 12/25/2015   Procedure: INSERTION PORT-A-CATH WITH [place];  Surgeon: Fanny Skates, MD;  Location: [facility];  Service: General;  Laterality: Right;  . REMOVAL OF BILATERAL TISSUE EXPANDERS WITH PLACEMENT OF BILATERAL BREAST IMPLANTS Bilateral 07/28/2016   Procedure: REMOVAL OF BILATERAL TISSUE EXPANDERS WITH PLACEMENT OF BILATERAL SILICONE BREAST IMPLANTS;  Surgeon: Irene Limbo, MD;  Location: [facility];  Service: Plastics;  Laterality: Bilateral;  . TONSILLECTOMY    . WISDOM TOOTH EXTRACTION  1978    Past Medical Hx:  Past Medical History:  Diagnosis Date  . Anemia   . Arthritis    "mostly in my hands; alot in my knees" (04/21/2016)  . Breast cancer of upper-outer quadrant of left female breast ([place]) 12/05/2015  . Chemotherapy management, encounter for 12/27/15 started  . Chest pain   . Family history of premature CAD   . Fibroid   .  HLD (hyperlipidemia)   . Neuropathy    feet  . Pneumonia 1996; 2006  . PONV (postoperative nausea and vomiting)    scop patch works well    Past Gynecological History: c/s x 4  S/p hysterectomy. No LMP recorded. Patient has had a hysterectomy.  Family Hx:  Family History  Problem Relation Age of Onset  . Other Mother        benign meningioma dx 14 s/p surgery; hx of hysterectomy for hemorrhaging  . Hypothyroidism Mother   . CAD Mother   . Colon cancer Father 96       w/ mets to lungs and liver; + chemo; d. 34  . Gout Father   . Arthritis Father   . Ovarian cancer Sister        early 47s; "precancerous cells" s/p USO  . Breast cancer Sister 73       s/p mastectomy and aromatase inhibitor  . Other Paternal Aunt 76       Primary peritoneal cancer; s/p surgery  . Cancer Paternal Uncle   . Ovarian cancer Sister 46       "pre-cancer"; s/p BSO  . Cervical cancer Sister   . Basal cell carcinoma Sister   . Colon polyps Sister        unspecified number  . Breast cancer Cousin 10       s/p mastectomy  .  Ovarian cancer Cousin 16       s/p BSO  . Leukemia Cousin 39  . Cancer Cousin        dx carcinoid bowel tumor  . Other Cousin        reportedly negative "BRCA1/2 testing"  . Basal cell carcinoma Cousin   . Breast cancer Cousin 85       reportedly negative GT is 2014-2015  . CAD Maternal Grandmother        d. 90s  . Heart attack Maternal Grandfather 51  . Heart Problems Paternal Grandmother   . Heart Problems Paternal Grandfather   . CAD Brother        triple vessel CAD, d. 32  . Other Son        enlarged prostate w/ surveillance  . Thyroid cancer Maternal Uncle 71       unspecified type  . CAD Maternal Uncle        d. 25  . Other Cousin 48       dx benign "skin polyp"  . Breast cancer Other        paternal great aunt (PGM's sister)  . Breast cancer Other        paternal great aunt (PGM's sister)    Review of Systems:  Constitutional  Feels well,    ENT Normal appearing ears and nares bilaterally Skin/Breast  No rash, sores, jaundice, itching, dryness Cardiovascular  No chest pain, shortness of breath, or edema  Pulmonary  No cough or wheeze.  Gastro Intestinal  No nausea, vomitting, or diarrhoea. No bright red blood per rectum, no abdominal pain, change in bowel movement, or constipation.  Genito Urinary  No frequency, urgency, dysuria,  Musculo Skeletal  No myalgia, arthralgia, joint swelling or pain  Neurologic  No weakness, numbness, change in gait,  Psychology  No depression, anxiety, insomnia.   Vitals:  Blood pressure 131/87, pulse 83, temperature 98.1 F (36.7 C), resp. rate 18, height '5\' 7"'$  (1.702 m), weight 163 lb 3.2 oz (74 kg), SpO2 99 %.  Physical Exam: WD in NAD  Neck  Supple NROM, without any enlargements.  Lymph Node Survey No cervical supraclavicular or inguinal adenopathy Cardiovascular  Pulse normal rate, regularity and rhythm. S1 and S2 normal.  Lungs  Clear to auscultation bilateraly, without wheezes/crackles/rhonchi. Good air  movement.  Skin  No rash/lesions/breakdown  Psychiatry  Alert and oriented to person, place, and time  Abdomen  Normoactive bowel sounds, abdomen soft, non-tender and nonobese without evidence of hernia.  Back No CVA tenderness Genito Urinary  Vulva/vagina: Normal external female genitalia.  No lesions. No discharge or bleeding.  Bladder/urethra:  No lesions or masses, well supported bladder  Vagina: normal  Cervix and uterus: surgically absent  Adnexa: no palpable masses. Rectal  deferred Extremities  No bilateral cyanosis, clubbing or edema.  60 minutes of time was spent with the patient with >50% spent in face to face counseling.   Donaciano Eva, MD  11/16/2016, 10:16 AM

## 2016-11-27 ENCOUNTER — Ambulatory Visit: Payer: BC Managed Care – PPO

## 2016-11-27 ENCOUNTER — Ambulatory Visit (HOSPITAL_BASED_OUTPATIENT_CLINIC_OR_DEPARTMENT_OTHER): Payer: BC Managed Care – PPO

## 2016-11-27 ENCOUNTER — Other Ambulatory Visit (HOSPITAL_BASED_OUTPATIENT_CLINIC_OR_DEPARTMENT_OTHER): Payer: BC Managed Care – PPO

## 2016-11-27 DIAGNOSIS — Z5112 Encounter for antineoplastic immunotherapy: Secondary | ICD-10-CM | POA: Diagnosis not present

## 2016-11-27 DIAGNOSIS — C50412 Malignant neoplasm of upper-outer quadrant of left female breast: Secondary | ICD-10-CM | POA: Diagnosis not present

## 2016-11-27 DIAGNOSIS — C773 Secondary and unspecified malignant neoplasm of axilla and upper limb lymph nodes: Secondary | ICD-10-CM

## 2016-11-27 DIAGNOSIS — G609 Hereditary and idiopathic neuropathy, unspecified: Secondary | ICD-10-CM

## 2016-11-27 DIAGNOSIS — Z170 Estrogen receptor positive status [ER+]: Secondary | ICD-10-CM

## 2016-11-27 DIAGNOSIS — Z95828 Presence of other vascular implants and grafts: Secondary | ICD-10-CM

## 2016-11-27 LAB — COMPREHENSIVE METABOLIC PANEL
ALT: 19 U/L (ref 0–55)
ANION GAP: 6 meq/L (ref 3–11)
AST: 21 U/L (ref 5–34)
Albumin: 3.6 g/dL (ref 3.5–5.0)
Alkaline Phosphatase: 70 U/L (ref 40–150)
BILIRUBIN TOTAL: 0.28 mg/dL (ref 0.20–1.20)
BUN: 15.2 mg/dL (ref 7.0–26.0)
CHLORIDE: 106 meq/L (ref 98–109)
CO2: 28 meq/L (ref 22–29)
Calcium: 9.4 mg/dL (ref 8.4–10.4)
Creatinine: 0.8 mg/dL (ref 0.6–1.1)
EGFR: 84 mL/min/{1.73_m2} — AB (ref >90)
Glucose: 85 mg/dl (ref 70–140)
POTASSIUM: 4.3 meq/L (ref 3.5–5.1)
SODIUM: 140 meq/L (ref 136–145)
Total Protein: 6.7 g/dL (ref 6.4–8.3)

## 2016-11-27 LAB — CBC WITH DIFFERENTIAL/PLATELET
BASO%: 0.6 % (ref 0.0–2.0)
Basophils Absolute: 0 10*3/uL (ref 0.0–0.1)
EOS ABS: 0 10*3/uL (ref 0.0–0.5)
EOS%: 1.1 % (ref 0.0–7.0)
HCT: 34.8 % (ref 34.8–46.6)
HGB: 11.7 g/dL (ref 11.6–15.9)
LYMPH%: 40.9 % (ref 14.0–49.7)
MCH: 29.5 pg (ref 25.1–34.0)
MCHC: 33.6 g/dL (ref 31.5–36.0)
MCV: 87.9 fL (ref 79.5–101.0)
MONO#: 0.3 10*3/uL (ref 0.1–0.9)
MONO%: 9.7 % (ref 0.0–14.0)
NEUT#: 1.7 10*3/uL (ref 1.5–6.5)
NEUT%: 47.7 % (ref 38.4–76.8)
PLATELETS: 157 10*3/uL (ref 145–400)
RBC: 3.96 10*6/uL (ref 3.70–5.45)
RDW: 13 % (ref 11.2–14.5)
WBC: 3.5 10*3/uL — ABNORMAL LOW (ref 3.9–10.3)
lymph#: 1.4 10*3/uL (ref 0.9–3.3)

## 2016-11-27 MED ORDER — SODIUM CHLORIDE 0.9 % IJ SOLN
10.0000 mL | INTRAMUSCULAR | Status: DC | PRN
  Administered 2016-11-27: 10 mL via INTRAVENOUS
  Filled 2016-11-27: qty 10

## 2016-11-27 MED ORDER — SODIUM CHLORIDE 0.9% FLUSH
10.0000 mL | INTRAVENOUS | Status: DC | PRN
  Administered 2016-11-27: 10 mL
  Filled 2016-11-27: qty 10

## 2016-11-27 MED ORDER — SODIUM CHLORIDE 0.9 % IV SOLN
Freq: Once | INTRAVENOUS | Status: AC
Start: 2016-11-27 — End: 2016-11-27
  Administered 2016-11-27: 10:00:00 via INTRAVENOUS

## 2016-11-27 MED ORDER — CYANOCOBALAMIN 1000 MCG/ML IJ SOLN
INTRAMUSCULAR | Status: AC
  Filled 2016-11-27: qty 1

## 2016-11-27 MED ORDER — ACETAMINOPHEN 325 MG PO TABS
ORAL_TABLET | ORAL | Status: AC
  Filled 2016-11-27: qty 2

## 2016-11-27 MED ORDER — ACETAMINOPHEN 325 MG PO TABS
650.0000 mg | ORAL_TABLET | Freq: Once | ORAL | Status: AC
  Administered 2016-11-27: 650 mg via ORAL

## 2016-11-27 MED ORDER — CYANOCOBALAMIN 1000 MCG/ML IJ SOLN
1000.0000 ug | Freq: Once | INTRAMUSCULAR | Status: AC
  Administered 2016-11-27: 1000 ug via INTRAMUSCULAR

## 2016-11-27 MED ORDER — HEPARIN SOD (PORK) LOCK FLUSH 100 UNIT/ML IV SOLN
500.0000 [IU] | Freq: Once | INTRAVENOUS | Status: AC | PRN
  Administered 2016-11-27: 500 [IU]
  Filled 2016-11-27: qty 5

## 2016-11-27 MED ORDER — TRASTUZUMAB CHEMO 150 MG IV SOLR
450.0000 mg | Freq: Once | INTRAVENOUS | Status: AC
  Administered 2016-11-27: 450 mg via INTRAVENOUS
  Filled 2016-11-27: qty 21.43

## 2016-11-27 NOTE — Patient Instructions (Signed)
[facility] Discharge Instructions for Patients Receiving Chemotherapy  Today you received the following chemotherapy agents Herceptin.  To help prevent nausea and vomiting after your treatment, we encourage you to take your nausea medication as directed.   If you develop nausea and vomiting that is not controlled by your nausea medication, call the clinic.   BELOW ARE SYMPTOMS THAT SHOULD BE REPORTED IMMEDIATELY:  *FEVER GREATER THAN 100.5 F  *CHILLS WITH OR WITHOUT FEVER  NAUSEA AND VOMITING THAT IS NOT CONTROLLED WITH YOUR NAUSEA MEDICATION  *UNUSUAL SHORTNESS OF BREATH  *UNUSUAL BRUISING OR BLEEDING  TENDERNESS IN MOUTH AND THROAT WITH OR WITHOUT PRESENCE OF ULCERS  *URINARY PROBLEMS  *BOWEL PROBLEMS  UNUSUAL RASH Items with * indicate a potential emergency and should be followed up as soon as possible.  Feel free to call the clinic you have any questions or concerns. The clinic phone number is [phone].  

## 2016-11-27 NOTE — Patient Instructions (Signed)

## 2016-12-02 ENCOUNTER — Ambulatory Visit (HOSPITAL_COMMUNITY)
Admission: RE | Admit: 2016-12-02 | Discharge: 2016-12-02 | Disposition: A | Discharge status: Home / Self Care | Payer: BC Managed Care – PPO | Source: Ambulatory Visit | Attending: Medical | Admitting: Medical

## 2016-12-02 ENCOUNTER — Encounter (HOSPITAL_COMMUNITY): Payer: Self-pay | Admitting: Internal Medicine

## 2016-12-02 ENCOUNTER — Ambulatory Visit (HOSPITAL_BASED_OUTPATIENT_CLINIC_OR_DEPARTMENT_OTHER)
Admission: RE | Admit: 2016-12-02 | Discharge: 2016-12-02 | Disposition: A | Discharge status: Home / Self Care | Payer: BC Managed Care – PPO | Source: Ambulatory Visit | Attending: Internal Medicine | Admitting: Internal Medicine

## 2016-12-02 VITALS — BP 124/82 | HR 71 | Wt 162.2 lb

## 2016-12-02 DIAGNOSIS — Z7982 Long term (current) use of aspirin: Secondary | ICD-10-CM | POA: Diagnosis not present

## 2016-12-02 DIAGNOSIS — G629 Polyneuropathy, unspecified: Secondary | ICD-10-CM | POA: Diagnosis not present

## 2016-12-02 DIAGNOSIS — Z803 Family history of malignant neoplasm of breast: Secondary | ICD-10-CM | POA: Diagnosis not present

## 2016-12-02 DIAGNOSIS — Z87891 Personal history of nicotine dependence: Secondary | ICD-10-CM | POA: Insufficient documentation

## 2016-12-02 DIAGNOSIS — R0609 Other forms of dyspnea: Secondary | ICD-10-CM | POA: Diagnosis not present

## 2016-12-02 DIAGNOSIS — Z8249 Family history of ischemic heart disease and other diseases of the circulatory system: Secondary | ICD-10-CM | POA: Diagnosis not present

## 2016-12-02 DIAGNOSIS — Z170 Estrogen receptor positive status [ER+]: Secondary | ICD-10-CM | POA: Insufficient documentation

## 2016-12-02 DIAGNOSIS — C50412 Malignant neoplasm of upper-outer quadrant of left female breast: Secondary | ICD-10-CM | POA: Diagnosis not present

## 2016-12-02 DIAGNOSIS — Z9221 Personal history of antineoplastic chemotherapy: Secondary | ICD-10-CM | POA: Insufficient documentation

## 2016-12-02 DIAGNOSIS — E785 Hyperlipidemia, unspecified: Secondary | ICD-10-CM | POA: Insufficient documentation

## 2016-12-02 DIAGNOSIS — Z9013 Acquired absence of bilateral breasts and nipples: Secondary | ICD-10-CM | POA: Insufficient documentation

## 2016-12-02 DIAGNOSIS — C50912 Malignant neoplasm of unspecified site of left female breast: Secondary | ICD-10-CM | POA: Diagnosis present

## 2016-12-02 LAB — ECHOCARDIOGRAM COMPLETE
CHL CUP MV DEC (S): 268
E/e' ratio: 5.97
EWDT: 268 ms
FS: 40 % (ref 28–44)
IVS/LV PW RATIO, ED: 0.88
LA vol: 35.9 mL
LADIAMINDEX: 1.59 cm/m2
LASIZE: 30 mm
LAVOLA4C: 35.2 mL
LAVOLIN: 19.1 mL/m2
LDCA: 2.54 cm2
LEFT ATRIUM END SYS DIAM: 30 mm
LV PW d: 8.34 mm — AB (ref 0.6–1.1)
LV TDI E'LATERAL: 11.9
LVEEAVG: 5.97
LVEEMED: 5.97
LVELAT: 11.9 cm/s
LVOTD: 18 mm
Lateral S' vel: 14 cm/s
MV Peak grad: 2 mmHg
MV pk A vel: 69.8 m/s
MV pk E vel: 71.1 m/s
Reg peak vel: 227 cm/s
TAPSE: 22.6 mm
TDI e' medial: 7.4
TRMAXVEL: 227 cm/s

## 2016-12-02 NOTE — Patient Instructions (Signed)
Calcium scoring CT scan  Follow up as needed

## 2016-12-02 NOTE — Addendum Note (Signed)
Encounter addended by: Scarlette Calico, RN on: 12/02/2016 11:37 AM<BR>    Actions taken: Order list changed, Diagnosis association updated, Sign clinical note

## 2016-12-02 NOTE — Progress Notes (Signed)
CARDIO-ONCOLOGY CLINIC CONSULT NOTE  Referring Physician: Magrinat Cardiologist: Fletcher Anon   HPI:  Alexandra Mathis is a 61 y/o female with HL, strong family h/o CAD and left breast CA referred by Dr. Jana Hakim for enrollment into the [facility].   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 Biopsy of the left axillary lymph node was negative.  Breast CA: clinical T1c pN0, stage IA  invasive ductal carcinoma, grade 3, estrogen and progesterone receptor positive, with an MIB-1 of 15%, and HER-2 amplification  (1) neoadjuvant chemotherapy consisting of weekly paclitaxel 12 with trastuzumab and pertuzumab every 21 days 4 started 12/27/2015             (a) paclitaxel discontinued after 7 doses because of peripheral neuropathy             (b) received carboplatin/gemcitabine weekly 5 beginning 02/21/2016, last dose 03/27/2016             (c) cycle 3 of carboplatin/Gemzar delayed one week because of thrombocytopenia; carbo dose reduced 10%             (d) cycle 5 carboplatin dose reduced an additional 10% and gemcitabine reduced to 800 mg/m due to thrombocytopenia  (2) trastuzumab to be continued to total of one year (through July 2018)  Has completed chemo. Had double mastectomy with implants place 07/28/16  Now on herceptin alone. Feels ok. Still a bit fatigued. Finishes Herceptin in 2weeks Enjoying the sun again. [place] without problems.   Echo 03/27/16: EF 60-65% GLS -20% Echo 08/28/16: EF 55-60% GLS -18.3% LS 12.6 cm/s Echo 12/02/16: EF 55-60% GLS -17.9 LS 12.3 ccm/s   Past Medical History:  Diagnosis Date  . Anemia   . Arthritis    "mostly in my hands; alot in my knees"  (04/21/2016)  . Breast cancer of upper-outer quadrant of left female breast ([place]) 12/05/2015  . Chemotherapy management, encounter for 12/27/15 started  . Chest pain   . Family history of premature CAD   . Fibroid   . HLD (hyperlipidemia)   . Neuropathy    feet  . Pneumonia 1996; 2006  . PONV (postoperative nausea and vomiting)    scop patch works well    Current Outpatient Prescriptions  Medication Sig Dispense Refill  . anastrozole (ARIMIDEX) 1 MG tablet Take 1 tablet (1 mg total) by mouth daily. 30 tablet 3  . ascorbic acid (VITAMIN C) 250 MG CHEW Chew 500 mg by mouth daily. In the winter months    . aspirin EC 81 MG tablet Take 81 mg by mouth daily.    Marland Kitchen atorvastatin (LIPITOR) 40 MG tablet Take 40 mg by mouth daily.  5  . Cetirizine HCl 10 MG CAPS Take 10 mg by mouth daily as needed. allergies    . Cholecalciferol 1000 units capsule Take 2,000 Units by mouth daily.     Marland Kitchen etodolac (LODINE) 400 MG tablet Take 400 mg by mouth 2 (two) times daily as needed for mild pain.   2  . FIBER COMPLETE PO Take 5 tablets by mouth daily.     . Multiple Vitamin (MULTIVITAMIN) tablet Take 1 tablet by mouth daily.      . Probiotic Product (ALIGN PO) Take  1 tablet by mouth daily.     . vitamin B-12 (CYANOCOBALAMIN) 500 MCG tablet Take 500 mcg by mouth daily.    . Vitamins C E (CRANBERRY CONCENTRATE PO) Take 1 tablet by mouth daily.     . nitroGLYCERIN (NITROLINGUAL) 0.4 MG/SPRAY spray Place 1 spray under the tongue every 5 (five) minutes x 3 doses as needed for chest pain.     No current facility-administered medications for this encounter.    Facility-Administered Medications Ordered in Other Encounters  Medication Dose Route Frequency Provider Last Rate Last Dose  . sodium chloride 0.9 % injection 10 mL  10 mL Intravenous PRN Magrinat, Virgie Dad, MD   10 mL at 06/15/16 0846    Allergies  Allergen Reactions  . Codeine Itching    Can not tolerate tylenol #3  . Morphine And Related Hives, Nausea  And Vomiting and Rash  . Vicodin [Hydrocodone-Acetaminophen] Palpitations      Social History   Social History  . Marital status: Married    Spouse name: N/A  . Number of children: 4  . Years of education: N/A   Occupational History  . teacher    Social History Main Topics  . Smoking status: Former Smoker    Years: 0.25    Types: Cigarettes    Quit date: 05/25/1974  . Smokeless tobacco: Never Used     Comment: was only a social/weekend smoker; smoked for a couple months total  . Alcohol use 0.5 oz/week    1 Standard drinks or equivalent per week     Comment: 04/21/2016 "1 glass wine a few times/year"  . Drug use: No     Comment: last used 25 yrs ago  . Sexual activity: Yes    Birth control/ protection: Surgical, Post-menopausal   Other Topics Concern  . Not on file   Social History Narrative  . No narrative on file      Family History  Problem Relation Age of Onset  . Other Mother        benign meningioma dx 41 s/p surgery; hx of hysterectomy for hemorrhaging  . Hypothyroidism Mother   . CAD Mother   . Colon cancer Father 57       w/ mets to lungs and liver; + chemo; d. 60  . Gout Father   . Arthritis Father   . Ovarian cancer Sister        early 109s; "precancerous cells" s/p USO  . Breast cancer Sister 54       s/p mastectomy and aromatase inhibitor  . Other Paternal Aunt 38       Primary peritoneal cancer; s/p surgery  . Cancer Paternal Uncle   . Ovarian cancer Sister 28       "pre-cancer"; s/p BSO  . Cervical cancer Sister   . Basal cell carcinoma Sister   . Colon polyps Sister        unspecified number  . Breast cancer Cousin 99       s/p mastectomy  . Ovarian cancer Cousin 80       s/p BSO  . Leukemia Cousin 34  . Cancer Cousin        dx carcinoid bowel tumor  . Other Cousin        reportedly negative "BRCA1/2 testing"  . Basal cell carcinoma Cousin   . Breast cancer Cousin 64       reportedly negative GT is 2014-2015  . CAD Maternal  Grandmother  d. 90s  . Heart attack Maternal Grandfather 51  . Heart Problems Paternal Grandmother   . Heart Problems Paternal Grandfather   . CAD Brother        triple vessel CAD, d. 45  . Other Son        enlarged prostate w/ surveillance  . Thyroid cancer Maternal Uncle 71       unspecified type  . CAD Maternal Uncle        d. 53  . Other Cousin 48       dx benign "skin polyp"  . Breast cancer Other        paternal great aunt (PGM's sister)  . Breast cancer Other        paternal great aunt (PGM's sister)    Vitals:   12/02/16 1046  BP: 124/82  Pulse: 71  SpO2: 100%  Weight: 162 lb 4 oz (73.6 kg)   PHYSICAL EXAM: General:  Well appearing. No resp difficulty HEENT: normal Neck: supple. no JVD.  R port. Carotids 2+ bilat; no bruits. No lymphadenopathy or thryomegaly appreciated. Cor: PMI nondisplaced. Regular rate & rhythm. No rubs, gallops or murmurs. Lungs: clear Abdomen: soft, nontender, nondistended. No hepatosplenomegaly. No bruits or masses. Good bowel sounds. Extremities: no cyanosis, clubbing, rash, edema Neuro: alert & orientedx3, cranial nerves grossly intact. moves all 4 extremities w/o difficulty. Affect pleasant   ASSESSMENT & PLAN: 1. Left breast CA   --clinical T1c pN0, stage IA  invasive ductal carcinoma, grade 3, estrogen and progesterone receptor positive, with an MIB-1 of 15%, and HER-2 amplification   --now has completed chemo and double mastectomy with reconstruction   --on herceptin alone now   --I reviewed echos personally. EF and Doppler parameters stable. No HF on exam. Continue Herceptin. Wil finish in 2 weeks. Can f/u PRN   2. Family h/o CAD   --does have some exertional dyspnea. Will start with calcium scoring.   Itza Maniaci,MD 11:23 AM

## 2016-12-02 NOTE — Progress Notes (Signed)
  Echocardiogram 2D Echocardiogram has been performed.  Alexandra Mathis 12/02/2016, 11:03 AM

## 2016-12-18 ENCOUNTER — Other Ambulatory Visit (HOSPITAL_BASED_OUTPATIENT_CLINIC_OR_DEPARTMENT_OTHER): Payer: BC Managed Care – PPO

## 2016-12-18 ENCOUNTER — Encounter: Payer: Self-pay | Admitting: *Deleted

## 2016-12-18 ENCOUNTER — Ambulatory Visit (HOSPITAL_BASED_OUTPATIENT_CLINIC_OR_DEPARTMENT_OTHER): Payer: BC Managed Care – PPO

## 2016-12-18 ENCOUNTER — Ambulatory Visit: Payer: BC Managed Care – PPO

## 2016-12-18 ENCOUNTER — Ambulatory Visit (HOSPITAL_BASED_OUTPATIENT_CLINIC_OR_DEPARTMENT_OTHER): Payer: BC Managed Care – PPO | Admitting: Oncology

## 2016-12-18 VITALS — BP 134/74 | HR 67 | Temp 98.9°F | Resp 20 | Ht 67.0 in | Wt 164.3 lb

## 2016-12-18 DIAGNOSIS — C50412 Malignant neoplasm of upper-outer quadrant of left female breast: Secondary | ICD-10-CM

## 2016-12-18 DIAGNOSIS — Z170 Estrogen receptor positive status [ER+]: Secondary | ICD-10-CM

## 2016-12-18 DIAGNOSIS — Z5112 Encounter for antineoplastic immunotherapy: Secondary | ICD-10-CM | POA: Diagnosis not present

## 2016-12-18 DIAGNOSIS — C773 Secondary and unspecified malignant neoplasm of axilla and upper limb lymph nodes: Secondary | ICD-10-CM

## 2016-12-18 DIAGNOSIS — Z95828 Presence of other vascular implants and grafts: Secondary | ICD-10-CM

## 2016-12-18 LAB — CBC WITH DIFFERENTIAL/PLATELET
BASO%: 0.5 % (ref 0.0–2.0)
Basophils Absolute: 0 10*3/uL (ref 0.0–0.1)
EOS%: 2.7 % (ref 0.0–7.0)
Eosinophils Absolute: 0.1 10*3/uL (ref 0.0–0.5)
HCT: 37.3 % (ref 34.8–46.6)
HGB: 12.7 g/dL (ref 11.6–15.9)
LYMPH%: 38.3 % (ref 14.0–49.7)
MCH: 29.8 pg (ref 25.1–34.0)
MCHC: 34 g/dL (ref 31.5–36.0)
MCV: 87.6 fL (ref 79.5–101.0)
MONO#: 0.4 10*3/uL (ref 0.1–0.9)
MONO%: 9.6 % (ref 0.0–14.0)
NEUT#: 1.8 10*3/uL (ref 1.5–6.5)
NEUT%: 48.9 % (ref 38.4–76.8)
PLATELETS: 144 10*3/uL — AB (ref 145–400)
RBC: 4.26 10*6/uL (ref 3.70–5.45)
RDW: 13.3 % (ref 11.2–14.5)
WBC: 3.8 10*3/uL — ABNORMAL LOW (ref 3.9–10.3)
lymph#: 1.4 10*3/uL (ref 0.9–3.3)

## 2016-12-18 LAB — COMPREHENSIVE METABOLIC PANEL
ALT: 21 U/L (ref 0–55)
ANION GAP: 8 meq/L (ref 3–11)
AST: 27 U/L (ref 5–34)
Albumin: 4 g/dL (ref 3.5–5.0)
Alkaline Phosphatase: 70 U/L (ref 40–150)
BUN: 18.8 mg/dL (ref 7.0–26.0)
CHLORIDE: 104 meq/L (ref 98–109)
CO2: 28 meq/L (ref 22–29)
CREATININE: 0.8 mg/dL (ref 0.6–1.1)
Calcium: 9.6 mg/dL (ref 8.4–10.4)
EGFR: 80 mL/min/{1.73_m2} — AB (ref >90)
Glucose: 84 mg/dl (ref 70–140)
Potassium: 4.1 mEq/L (ref 3.5–5.1)
SODIUM: 140 meq/L (ref 136–145)
Total Bilirubin: 0.51 mg/dL (ref 0.20–1.20)
Total Protein: 7.4 g/dL (ref 6.4–8.3)

## 2016-12-18 MED ORDER — SODIUM CHLORIDE 0.9 % IJ SOLN
10.0000 mL | INTRAMUSCULAR | Status: DC | PRN
  Administered 2016-12-18: 10 mL via INTRAVENOUS
  Filled 2016-12-18: qty 10

## 2016-12-18 MED ORDER — ACETAMINOPHEN 325 MG PO TABS
ORAL_TABLET | ORAL | Status: AC
  Filled 2016-12-18: qty 2

## 2016-12-18 MED ORDER — SODIUM CHLORIDE 0.9% FLUSH
10.0000 mL | INTRAVENOUS | Status: DC | PRN
  Administered 2016-12-18: 10 mL
  Filled 2016-12-18: qty 10

## 2016-12-18 MED ORDER — HEPARIN SOD (PORK) LOCK FLUSH 100 UNIT/ML IV SOLN
500.0000 [IU] | Freq: Once | INTRAVENOUS | Status: AC | PRN
  Administered 2016-12-18: 500 [IU]
  Filled 2016-12-18: qty 5

## 2016-12-18 MED ORDER — SODIUM CHLORIDE 0.9 % IV SOLN
Freq: Once | INTRAVENOUS | Status: AC
  Administered 2016-12-18: 10:00:00 via INTRAVENOUS

## 2016-12-18 MED ORDER — TRASTUZUMAB CHEMO 150 MG IV SOLR
450.0000 mg | Freq: Once | INTRAVENOUS | Status: AC
  Administered 2016-12-18: 450 mg via INTRAVENOUS
  Filled 2016-12-18: qty 21.43

## 2016-12-18 MED ORDER — DIPHENHYDRAMINE HCL 25 MG PO CAPS: 25.0000 mg | ORAL_CAPSULE | Freq: Once | ORAL | Status: DC

## 2016-12-18 MED ORDER — ACETAMINOPHEN 325 MG PO TABS
650.0000 mg | ORAL_TABLET | Freq: Once | ORAL | Status: AC
  Administered 2016-12-18: 650 mg via ORAL

## 2016-12-18 NOTE — Progress Notes (Signed)
Trenda Moots is arrived as perhis arival she went to the tolerating she wasproposed thatshe didn't elaborate [place]  [phone]) [phone] Fax:([phone]) [phone]     ID: MCKENA CHERN DOB: 06/17/1955  MR#: [id number]  BSW#:[id number]  Patient Care Team: Darrol Jump, PA-C as PCP - General (Family Medicine) Fanny Skates, MD as Consulting Physician (General Surgery) Magrinat, Virgie Dad, MD as Consulting Physician (Oncology) Kyung Rudd, MD as Consulting Physician (Radiation Oncology) Larey Dresser, MD as Consulting Physician (Cardiology) Irene Limbo, MD as Consulting Physician (Plastic Surgery) Megan Salon, MD as Consulting Physician (Gynecology) OTHER MD:  CHIEF COMPLAINT: Triple positive breast cancer  CURRENT TREATMENT: anastrozole   BREAST CANCER HISTORY: From the original intake note:  "Lesleigh Noe" had screening mammography in June 2017 showing left breast upper outer quadrant calcifications. Accordingly she was referred for left diagnostic mammography and ultrasonography, performed 11/27/2015. The breast density was category C. Compression views of the left breast found pleomorphic calcifications in the upper outer quadrant measuring up to 4.0 cm. There was an area of associated architectural distortion in the upper outer quadrant as well. On physical exam there was an irregular thickening in the left breast at the 2:00 position anteriorly. Ultrasonography confirmed a hypoechoic irregular mass measuring 1.6 cm in the 2:00 position 2 cm from the nipple. An intramammary lymph node was noted with benign appearing. In the left axilla there was a lymph node with focally thickened cortex.  Biopsy of the left breast mass and left axillary lymph node 12/02/2015 found ([id number]), in the breast, invasive ductal carcinoma, grade 3, estrogen receptor 95% positive, progesterone receptor 15% positive, both with strong staining intensity, with an MIB-1 of 15%, and  HER-2 amplification, the signals ratio being 2.54 and the number per cell 6.10. Biopsy of the left axillary lymph node was negative.  Her subsequent history is as detailed below  INTERVAL HISTORY: Lesleigh Noe returns today for follow-up of her triple positive breast cancer accompanied by her husband. Today is her final trastuzumab dose. She is completing a full year. She just had an echocardiogram which was very favorable.  She is also on anastrozole, with good tolerance. Hot flashes and vaginal dryness are not a major issue. She never developed the arthralgias or myalgias that many patients can experience on this medication. She obtains it at a good price.  REVIEW OF SYSTEMS: She has been doing a lot of traveling. They went to [place], [place], in [place]. She is planning to have bilateral salpingo-oophorectomy under Dr. Denman George 01/05/2017. She is hoping to have her port removed before she goes back to work August 17. Generally she feels well, is very active, and a detailed review of systems today was noncontributory  PAST MEDICAL HISTORY: Past Medical History:  Diagnosis Date  . Anemia   . Arthritis    "mostly in my hands; alot in my knees" (04/21/2016)  . Breast cancer of upper-outer quadrant of left female breast ([place]) 12/05/2015  . Chemotherapy management, encounter for 12/27/15 started  . Chest pain   . Family history of premature CAD   . Fibroid   . HLD (hyperlipidemia)   . Neuropathy    feet  . Pneumonia 1996; 2006  . PONV (postoperative nausea and vomiting)    scop patch works well    PAST SURGICAL HISTORY: Past Surgical History:  Procedure Laterality Date  . ABDOMINAL HYSTERECTOMY  1990   done with last cesarean section; "still have my ovaries"  . ANTERIOR CRUCIATE LIGAMENT REPAIR  Right   . BREAST BIOPSY Left 2017  . BREAST BIOPSY Right 2017   "MRI guided; benign"  . BREAST RECONSTRUCTION WITH PLACEMENT OF TISSUE EXPANDER AND FLEX HD (ACELLULAR HYDRATED DERMIS) Bilateral  04/21/2016  . BREAST RECONSTRUCTION WITH PLACEMENT OF TISSUE EXPANDER AND FLEX HD (ACELLULAR HYDRATED DERMIS) Bilateral 04/21/2016   Procedure: BREAST RECONSTRUCTION WITH PLACEMENT OF TISSUE EXPANDER AND ALLODERM;  Surgeon: Irene Limbo, MD;  Location: [facility];  Service: Plastics;  Laterality: Bilateral;  . [place]; 1983; 1987; 1990  . CESAREAN SECTION     done with last cesarean section  . CYST EXCISION  2007   on abdomen- 2 cyst removed; "benign"  . DILATION AND CURETTAGE OF UTERUS  ~ 1980; 1989  . GANGLION CYST EXCISION Right ~ 1975  . LAPAROSCOPIC ENDOMETRIOSIS FULGURATION  1989   w/D&C  . MASTECTOMY Bilateral 04/21/2016   LEFT SKIN SPARING MASTECTOMY WITH LEFT SENTINAL LYMPH NODE BIOPSY, RIGHT PROPHYLATIC SKIN SPARING MASTECTOMY   . NIPPLE SPARING MASTECTOMY/SENTINAL LYMPH NODE BIOPSY/RECONSTRUCTION/PLACEMENT OF TISSUE EXPANDER Bilateral 04/21/2016   Procedure: LEFT SKIN SPARING MASTECTOMY WITH LEFT SENTINAL LYMPH NODE BIOPSY, RIGHT PROPHYLATIC SKIN SPARING MASTECTOMY;  Surgeon: Fanny Skates, MD;  Location: [facility];  Service: General;  Laterality: Bilateral;  . PORTACATH PLACEMENT Right 12/25/2015   Procedure: INSERTION PORT-A-CATH WITH [place];  Surgeon: Fanny Skates, MD;  Location: [facility];  Service: General;  Laterality: Right;  . REMOVAL OF BILATERAL TISSUE EXPANDERS WITH PLACEMENT OF BILATERAL BREAST IMPLANTS Bilateral 07/28/2016   Procedure: REMOVAL OF BILATERAL TISSUE EXPANDERS WITH PLACEMENT OF BILATERAL SILICONE BREAST IMPLANTS;  Surgeon: Irene Limbo, MD;  Location: [facility];  Service: Plastics;  Laterality: Bilateral;  . TONSILLECTOMY    . WISDOM TOOTH EXTRACTION  1978    FAMILY HISTORY Family History  Problem Relation Age of Onset  . Other Mother        benign meningioma dx 72 s/p surgery; hx of hysterectomy for hemorrhaging  . Hypothyroidism Mother   . CAD Mother   . Colon cancer Father 20       w/ mets to lungs and liver; + chemo; d. 31   . Gout Father   . Arthritis Father   . Ovarian cancer Sister        early 73s; "precancerous cells" s/p USO  . Breast cancer Sister 101       s/p mastectomy and aromatase inhibitor  . Other Paternal Aunt 23       Primary peritoneal cancer; s/p surgery  . Cancer Paternal Uncle   . Ovarian cancer Sister 27       "pre-cancer"; s/p BSO  . Cervical cancer Sister   . Basal cell carcinoma Sister   . Colon polyps Sister        unspecified number  . Breast cancer Cousin 29       s/p mastectomy  . Ovarian cancer Cousin 57       s/p BSO  . Leukemia Cousin 50  . Cancer Cousin        dx carcinoid bowel tumor  . Other Cousin        reportedly negative "BRCA1/2 testing"  . Basal cell carcinoma Cousin   . Breast cancer Cousin 7       reportedly negative GT is 2014-2015  . CAD Maternal Grandmother        d. 90s  . Heart attack Maternal Grandfather 51  . Heart Problems Paternal Grandmother   . Heart Problems Paternal  Grandfather   . CAD Brother        triple vessel CAD, d. 77  . Other Son        enlarged prostate w/ surveillance  . Thyroid cancer Maternal Uncle 71       unspecified type  . CAD Maternal Uncle        d. 51  . Other Cousin 48       dx benign "skin polyp"  . Breast cancer Other        paternal great aunt (PGM's sister)  . Breast cancer Other        paternal great aunt (PGM's sister)    GYNECOLOGIC HISTORY:  No LMP recorded. Patient has had a hysterectomy. Menarche age 61, first live birth age 50. The patient is GX P4. She stopped having periods in 1989. She did not use hormone replacement. She used oral contraceptives less than one year.  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 Her husband [name] is retired from a [employer]. Son [name] lives in [place] and works as a Chief Strategy Officer. Son [name] lives in [place] and is with the [employer]. Son [name] this and cocoa [place] where he works as an Chief Financial Officer. Son [name]  lives in [place] were he works as an Chief Financial Officer. The patient has 4 grandchildren including a 24-year-old they have custody of and lives with them. The patient attends our [place]: [facility] [employer]   HEALTH MAINTENANCE: Social History  Substance Use Topics  . Smoking status: Former Smoker    Years: 0.25    Types: Cigarettes    Quit date: 05/25/1974  . Smokeless tobacco: Never Used     Comment: was only a social/weekend smoker; smoked for a couple months total  . Alcohol use 0.5 oz/week    1 Standard drinks or equivalent per week     Comment: 04/21/2016 "1 glass wine a few times/year"     Colonoscopy:  PAP:  Bone density:   Allergies  Allergen Reactions  . Codeine Itching    Can not tolerate tylenol #3  . Morphine And Related Hives, Nausea And Vomiting and Rash  . Vicodin [Hydrocodone-Acetaminophen] Palpitations    Current Outpatient Prescriptions  Medication Sig Dispense Refill  . anastrozole (ARIMIDEX) 1 MG tablet Take 1 tablet (1 mg total) by mouth daily. 30 tablet 3  . ascorbic acid (VITAMIN C) 250 MG CHEW Chew 500 mg by mouth daily. In the winter months    . aspirin EC 81 MG tablet Take 81 mg by mouth daily.    Marland Kitchen atorvastatin (LIPITOR) 40 MG tablet Take 40 mg by mouth daily.  5  . Cetirizine HCl 10 MG CAPS Take 10 mg by mouth daily as needed. allergies    . Cholecalciferol 1000 units capsule Take 2,000 Units by mouth daily.     Marland Kitchen etodolac (LODINE) 400 MG tablet Take 400 mg by mouth 2 (two) times daily as needed for mild pain.   2  . FIBER COMPLETE PO Take 5 tablets by mouth daily.     . Multiple Vitamin (MULTIVITAMIN) tablet Take 1 tablet by mouth daily.      . nitroGLYCERIN (NITROLINGUAL) 0.4 MG/SPRAY spray Place 1 spray under the tongue every 5 (five) minutes x 3 doses as needed for chest pain.    . Probiotic Product (ALIGN PO) Take 1 tablet by mouth daily.     . vitamin B-12 (  CYANOCOBALAMIN) 500 MCG tablet Take 500 mcg by mouth  daily.    . Vitamins C E (CRANBERRY CONCENTRATE PO) Take 1 tablet by mouth daily.      No current facility-administered medications for this visit.    Facility-Administered Medications Ordered in Other Visits  Medication Dose Route Frequency Provider Last Rate Last Dose  . sodium chloride 0.9 % injection 10 mL  10 mL Intravenous PRN Magrinat, Virgie Dad, MD   10 mL at 06/15/16 0846    OBJECTIVE: Middle-aged white woman In no acute distress  Vitals:   12/18/16 0839  BP: 134/74  Pulse: 67  Resp: 20  Temp: 98.9 F (37.2 C)     Body mass index is 25.73 kg/m.    ECOG FS:0 - Asymptomatic  Sclerae unicteric, EOMs intact Oropharynx clear and moist No cervical or supraclavicular adenopathy Lungs no rales or rhonchi Heart regular rate and rhythm Abd soft, nontender, positive bowel sounds MSK no focal spinal tenderness, no upper extremity lymphedema Neuro: nonfocal, well oriented, appropriate affect Breasts: She has undergone bilateral mastectomies. There is bilateral implant reconstruction. There is no evidence of local recurrence. Both axillae are benign.    LAB RESULTS:  CMP     Component Value Date/Time   NA 140 11/27/2016 0841   K 4.3 11/27/2016 0841   CL 103 04/15/2016 0942   CO2 28 11/27/2016 0841   GLUCOSE 85 11/27/2016 0841   BUN 15.2 11/27/2016 0841   CREATININE 0.8 11/27/2016 0841   CALCIUM 9.4 11/27/2016 0841   PROT 6.7 11/27/2016 0841   ALBUMIN 3.6 11/27/2016 0841   AST 21 11/27/2016 0841   ALT 19 11/27/2016 0841   ALKPHOS 70 11/27/2016 0841   BILITOT 0.28 11/27/2016 0841   GFRNONAA >60 04/15/2016 0942   GFRAA >60 04/15/2016 0942    INo results found for: SPEP, UPEP  Lab Results  Component Value Date   WBC 3.8 (L) 12/18/2016   NEUTROABS 1.8 12/18/2016   HGB 12.7 12/18/2016   HCT 37.3 12/18/2016   MCV 87.6 12/18/2016   PLT 144 (L) 12/18/2016      Chemistry      Component Value Date/Time   NA 140 11/27/2016 0841   K 4.3 11/27/2016 0841   CL 103  04/15/2016 0942   CO2 28 11/27/2016 0841   BUN 15.2 11/27/2016 0841   CREATININE 0.8 11/27/2016 0841      Component Value Date/Time   CALCIUM 9.4 11/27/2016 0841   ALKPHOS 70 11/27/2016 0841   AST 21 11/27/2016 0841   ALT 19 11/27/2016 0841   BILITOT 0.28 11/27/2016 0841       No results found for: LABCA2  No components found for: LABCA125  No results for input(s): INR in the last 168 hours.  Urinalysis No results found for: COLORURINE, APPEARANCEUR, LABSPEC, PHURINE, GLUCOSEU, HGBUR, BILIRUBINUR, KETONESUR, PROTEINUR, UROBILINOGEN, NITRITE, LEUKOCYTESUR   STUDIES: Bone density 08/05/2016 was normal with a T score of -0.9  ELIGIBLE FOR AVAILABLE RESEARCH PROTOCOL: no  ASSESSMENT: 61 y.o. [place]is [place], [place] woman status post left breast upper outer quadrant biopsy 12/02/2015 for a clinical T1c pN0, stage IA  invasive ductal carcinoma, grade 3, estrogen and progesterone receptor positive, with an MIB-1 of 15%, and HER-2 amplification  (a) biopsy of a 0.6 cm lower outer quadrant right breast mass 12/26/2015  (1) neoadjuvant chemotherapy consisting of weekly paclitaxel 12 with trastuzumab and pertuzumab every 21 days 4 started 12/27/2015  (a) paclitaxel discontinued after 7 doses because of peripheral neuropathy  (  b) received carboplatin/gemcitabine weekly 5 beginning 02/21/2016, last dose 03/27/2016  (c) cycle 3 of carboplatin/ delayed one week because of thrombocytopenia; carbo dose reduced 10%  (d) cycle 5 carboplatin dose reduced an additional 10% and gemcitabine reduced to 800 mg/m due to thrombocytopenia  (2) trastuzumab to be continued to total of one year (through July 2018)  (a) echo showed on 07/13/2016 ejection fraction in the 55 % range.  (b) echocardiogram 08/28/2016 showed an ejection fraction in the 55-60% range  (c) echo 12/02/2016 shows EF 55-60%  (3) underwent bilateral skin sparing mastectomies with left sentinel lymph node sampling and immediate implant  reconstruction 04/21/2016, showing  (a) on the right, no evidence of malignancy  (b) on the left, a residual pT1c pN0, stage IA  invasive ductal carcinoma, grade 2  (c) repeat prognostic panel confirms triple positive disease   (d) negative margins  (4) postmastectomy radiation not indicated   (5) anastrozole started 05/01/2016  (a) baseline bone density 08/05/2016 shows a T score of -0.9 (normal).  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 It is a real event. Her children are coming all the way from [place] anticipate during the bowel with her.  It is also favorable that her most recent echocardiogram continues to show Ejection fraction.  She continues on anastrozole and the plan is for 5 years on that drug. She will need a repeat bone density in March 2020  I have asked her surgeon to schedule port removal. She is also scheduled for bilateral salpingo-oophorectomy under Dr. Denman George on 01/05/2017. Lesleigh Noe will be going back to work 3 days later.  At this point I'm going to see her again in November, which will be the anniversary of her surgery, and then a 6 months later and then after November 2019 I will start seeing her on a once a year basis.    Chauncey Cruel, MD   12/18/2016 8:49 AM Medical Oncology and Hematology [facility] [address] [place],  [address] Tel. [phone]    Fax. [phone] 100 cGy.

## 2016-12-18 NOTE — Patient Instructions (Signed)

## 2016-12-18 NOTE — Patient Instructions (Signed)
[facility] Discharge Instructions for Patients Receiving Chemotherapy  Today you received the following chemotherapy agents Herceptin.  To help prevent nausea and vomiting after your treatment, we encourage you to take your nausea medication as directed.   If you develop nausea and vomiting that is not controlled by your nausea medication, call the clinic.   BELOW ARE SYMPTOMS THAT SHOULD BE REPORTED IMMEDIATELY:  *FEVER GREATER THAN 100.5 F  *CHILLS WITH OR WITHOUT FEVER  NAUSEA AND VOMITING THAT IS NOT CONTROLLED WITH YOUR NAUSEA MEDICATION  *UNUSUAL SHORTNESS OF BREATH  *UNUSUAL BRUISING OR BLEEDING  TENDERNESS IN MOUTH AND THROAT WITH OR WITHOUT PRESENCE OF ULCERS  *URINARY PROBLEMS  *BOWEL PROBLEMS  UNUSUAL RASH Items with * indicate a potential emergency and should be followed up as soon as possible.  Feel free to call the clinic you have any questions or concerns. The clinic phone number is [phone].  

## 2016-12-29 ENCOUNTER — Other Ambulatory Visit: Payer: Self-pay | Admitting: Oncology

## 2016-12-31 NOTE — Patient Instructions (Addendum)
Alexandra Mathis  12/31/2016   Your procedure is scheduled on: 01-05-17   Report to [facility] Main  Entrance Take [place]  elevators to 3rd floor to  [place] at 5:30 AM.   Call this number if you have problems the morning of surgery [phone]   Remember: ONLY 1 PERSON MAY GO WITH YOU TO SHORT STAY TO GET  READY MORNING OF [place].  Do not eat food or drink liquids :After Midnight.   Eat a light diet the day before surgery.  Examples including soups, broths, toast, yogurt, mashed potatoes.  Things to avoid include carbonated beverages (fizzy beverages), raw fruits and raw vegetables, or beans.   If your bowels are filled with gas, your surgeon will have difficulty visualizing your pelvic organs which increases your surgical risks.   Take these medicines the morning of surgery with A SIP OF WATER: None                                You may not have any metal on your body including hair pins and              piercings  Do not wear jewelry, make-up, lotions, powders or perfumes, deodorant             Do not wear nail polish.  Do not shave  48 hours prior to surgery.               Do not bring valuables to the hospital. [place].  Contacts, dentures or bridgework may not be worn into surgery.     Patients discharged the day of surgery will not be allowed to drive home.  Name and phone number of your driver: Lysette Lindenbaum [phone]                Please read over the following fact sheets you were given: _____________________________________________________________________             [facility] - Preparing for Surgery Before surgery, you can play an important role.  Because skin is not sterile, your skin needs to be as free of germs as possible.  You can reduce the number of germs on your skin by washing with CHG (chlorahexidine gluconate) soap before surgery.  CHG is an antiseptic cleaner  which kills germs and bonds with the skin to continue killing germs even after washing. Please DO NOT use if you have an allergy to CHG or antibacterial soaps.  If your skin becomes reddened/irritated stop using the CHG and inform your nurse when you arrive at Short Stay. Do not shave (including legs and underarms) for at least 48 hours prior to the first CHG shower.  You may shave your face/neck. Please follow these instructions carefully:  1.  Shower with CHG Soap the night before surgery and the  morning of Surgery.  2.  If you choose to wash your hair, wash your hair first as usual with your  normal  shampoo.  3.  After you shampoo, rinse your hair and body thoroughly to remove the  shampoo.  4.  Use CHG as you would any other liquid soap.  You can apply chg directly  to the skin and wash                       Gently with a scrungie or clean washcloth.  5.  Apply the CHG Soap to your body ONLY FROM THE NECK DOWN.   Do not use on face/ open                           Wound or open sores. Avoid contact with eyes, ears mouth and genitals (private parts).                       Wash face,  Genitals (private parts) with your normal soap.             6.  Wash thoroughly, paying special attention to the area where your surgery  will be performed.  7.  Thoroughly rinse your body with warm water from the neck down.  8.  DO NOT shower/wash with your normal soap after using and rinsing off  the CHG Soap.                9.  Pat yourself dry with a clean towel.            10.  Wear clean pajamas.            11.  Place clean sheets on your bed the night of your first shower and do not  sleep with pets. Day of Surgery : Do not apply any lotions/deodorants the morning of surgery.  Please wear clean clothes to the hospital/surgery center.  FAILURE TO FOLLOW THESE INSTRUCTIONS MAY RESULT IN THE CANCELLATION OF YOUR SURGERY PATIENT SIGNATURE_________________________________  NURSE  SIGNATURE__________________________________  ________________________________________________________________________   Adam Phenix  An incentive spirometer is a tool that can help keep your lungs clear and active. This tool measures how well you are filling your lungs with each breath. Taking long deep breaths may help reverse or decrease the chance of developing breathing (pulmonary) problems (especially infection) following:  A long period of time when you are unable to move or be active. BEFORE THE PROCEDURE   If the spirometer includes an indicator to show your best effort, your nurse or respiratory therapist will set it to a desired goal.  If possible, sit up straight or lean slightly forward. Try not to slouch.  Hold the incentive spirometer in an upright position. INSTRUCTIONS FOR USE  1. Sit on the edge of your bed if possible, or sit up as far as you can in bed or on a chair. 2. Hold the incentive spirometer in an upright position. 3. Breathe out normally. 4. Place the mouthpiece in your mouth and seal your lips tightly around it. 5. Breathe in slowly and as deeply as possible, raising the piston or the ball toward the top of the column. 6. Hold your breath for 3-5 seconds or for as long as possible. Allow the piston or ball to fall to the bottom of the column. 7. Remove the mouthpiece from your mouth and breathe out normally. 8. Rest for a few seconds and repeat Steps 1 through 7 at least 10 times every 1-2 hours when you are awake. Take your time and take a few normal breaths between deep breaths. 9. The spirometer may include an indicator to show  your best effort. Use the indicator as a goal to work toward during each repetition. 10. After each set of 10 deep breaths, practice coughing to be sure your lungs are clear. If you have an incision (the cut made at the time of surgery), support your incision when coughing by placing a pillow or rolled up towels firmly  against it. Once you are able to get out of bed, walk around indoors and cough well. You may stop using the incentive spirometer when instructed by your caregiver.  RISKS AND COMPLICATIONS  Take your time so you do not get dizzy or light-headed.  If you are in pain, you may need to take or ask for pain medication before doing incentive spirometry. It is harder to take a deep breath if you are having pain. AFTER USE  Rest and breathe slowly and easily.  It can be helpful to keep track of a log of your progress. Your caregiver can provide you with a simple table to help with this. If you are using the spirometer at home, follow these instructions: [place] IF:   You are having difficultly using the spirometer.  You have trouble using the spirometer as often as instructed.  Your pain medication is not giving enough relief while using the spirometer.  You develop fever of 100.5 F (38.1 C) or higher. SEEK IMMEDIATE MEDICAL CARE IF:   You cough up bloody sputum that had not been present before.  You develop fever of 102 F (38.9 C) or greater.  You develop worsening pain at or near the incision site. MAKE SURE YOU:   Understand these instructions.  Will watch your condition.  Will get help right away if you are not doing well or get worse. Document Released: 09/21/2006 Document Revised: 08/03/2011 Document Reviewed: 11/22/2006 ExitCare Patient Information 2014 ExitCare, [place].   ________________________________________________________________________  WHAT IS A BLOOD TRANSFUSION? Blood Transfusion Information  A transfusion is the replacement of blood or some of its parts. Blood is made up of multiple cells which provide different functions.  Red blood cells carry oxygen and are used for blood loss replacement.  White blood cells fight against infection.  Platelets control bleeding.  Plasma helps clot blood.  Other blood products are available for  specialized needs, such as hemophilia or other clotting disorders. BEFORE THE TRANSFUSION  Who gives blood for transfusions?   Healthy volunteers who are fully evaluated to make sure their blood is safe. This is blood bank blood. Transfusion therapy is the safest it has ever been in the practice of medicine. Before blood is taken from a donor, a complete history is taken to make sure that person has no history of diseases nor engages in risky social behavior (examples are intravenous drug use or sexual activity with multiple partners). The donor's travel history is screened to minimize risk of transmitting infections, such as malaria. The donated blood is tested for signs of infectious diseases, such as HIV and hepatitis. The blood is then tested to be sure it is compatible with you in order to minimize the chance of a transfusion reaction. If you or a relative donates blood, this is often done in anticipation of surgery and is not appropriate for emergency situations. It takes many days to process the donated blood. RISKS AND COMPLICATIONS Although transfusion therapy is very safe and saves many lives, the main dangers of transfusion include:   Getting an infectious disease.  Developing a transfusion reaction. This is an allergic reaction to  something in the blood you were given. Every precaution is taken to prevent this. The decision to have a blood transfusion has been considered carefully by your caregiver before blood is given. Blood is not given unless the benefits outweigh the risks. AFTER THE TRANSFUSION  Right after receiving a blood transfusion, you will usually feel much better and more energetic. This is especially true if your red blood cells have gotten low (anemic). The transfusion raises the level of the red blood cells which carry oxygen, and this usually causes an energy increase.  The nurse administering the transfusion will monitor you carefully for complications. HOME CARE  INSTRUCTIONS  No special instructions are needed after a transfusion. You may find your energy is better. Speak with your caregiver about any limitations on activity for underlying diseases you may have. SEEK MEDICAL CARE IF:   Your condition is not improving after your transfusion.  You develop redness or irritation at the intravenous (IV) site. SEEK IMMEDIATE MEDICAL CARE IF:  Any of the following symptoms occur over the next 12 hours:  Shaking chills.  You have a temperature by mouth above 102 F (38.9 C), not controlled by medicine.  Chest, back, or muscle pain.  People around you feel you are not acting correctly or are confused.  Shortness of breath or difficulty breathing.  Dizziness and fainting.  You get a rash or develop hives.  You have a decrease in urine output.  Your urine turns a dark color or changes to pink, red, or brown. Any of the following symptoms occur over the next 10 days:  You have a temperature by mouth above 102 F (38.9 C), not controlled by medicine.  Shortness of breath.  Weakness after normal activity.  The white part of the eye turns yellow (jaundice).  You have a decrease in the amount of urine or are urinating less often.  Your urine turns a dark color or changes to pink, red, or brown. Document Released: 05/08/2000 Document Revised: 08/03/2011 Document Reviewed: 12/26/2007 [facility] Patient Information 2014 [place], [place].  _______________________________________________________________________

## 2016-12-31 NOTE — Progress Notes (Signed)
12-02-16 (EPIC) ECHO EF 55-60%, no stenosis, no regurgitation  04-08-16 (EPIC) EKG, NSR  03-31-16 (EPIC) CXR

## 2017-01-01 ENCOUNTER — Encounter (HOSPITAL_COMMUNITY)
Admission: RE | Admit: 2017-01-01 | Discharge: 2017-01-01 | Disposition: A | Discharge status: Home / Self Care | Payer: BC Managed Care – PPO | Source: Ambulatory Visit | Attending: Gynecologic Oncology | Admitting: Gynecologic Oncology

## 2017-01-01 ENCOUNTER — Encounter (HOSPITAL_COMMUNITY): Payer: Self-pay

## 2017-01-01 DIAGNOSIS — Z1502 Genetic susceptibility to malignant neoplasm of ovary: Secondary | ICD-10-CM | POA: Diagnosis not present

## 2017-01-01 DIAGNOSIS — Z0183 Encounter for blood typing: Secondary | ICD-10-CM | POA: Insufficient documentation

## 2017-01-01 DIAGNOSIS — Z01812 Encounter for preprocedural laboratory examination: Secondary | ICD-10-CM | POA: Insufficient documentation

## 2017-01-01 DIAGNOSIS — Z01818 Encounter for other preprocedural examination: Secondary | ICD-10-CM | POA: Diagnosis not present

## 2017-01-01 LAB — CBC
HEMATOCRIT: 36 % (ref 36.0–46.0)
HEMOGLOBIN: 12.3 g/dL (ref 12.0–15.0)
MCH: 29.9 pg (ref 26.0–34.0)
MCHC: 34.2 g/dL (ref 30.0–36.0)
MCV: 87.4 fL (ref 78.0–100.0)
Platelets: 170 10*3/uL (ref 150–400)
RBC: 4.12 MIL/uL (ref 3.87–5.11)
RDW: 13.7 % (ref 11.5–15.5)
WBC: 4.2 10*3/uL (ref 4.0–10.5)

## 2017-01-01 LAB — COMPREHENSIVE METABOLIC PANEL
ALBUMIN: 4.3 g/dL (ref 3.5–5.0)
ALK PHOS: 62 U/L (ref 38–126)
ALT: 20 U/L (ref 14–54)
AST: 25 U/L (ref 15–41)
Anion gap: 6 (ref 5–15)
BILIRUBIN TOTAL: 0.7 mg/dL (ref 0.3–1.2)
BUN: 18 mg/dL (ref 6–20)
CALCIUM: 9.3 mg/dL (ref 8.9–10.3)
CO2: 31 mmol/L (ref 22–32)
Chloride: 106 mmol/L (ref 101–111)
Creatinine, Ser: 0.68 mg/dL (ref 0.44–1.00)
GFR calc Af Amer: >60 mL/min (ref >60)
GFR calc non Af Amer: >60 mL/min (ref >60)
GLUCOSE: 101 mg/dL — AB (ref 65–99)
POTASSIUM: 4.8 mmol/L (ref 3.5–5.1)
Sodium: 143 mmol/L (ref 135–145)
TOTAL PROTEIN: 7.2 g/dL (ref 6.5–8.1)

## 2017-01-01 LAB — URINALYSIS, ROUTINE W REFLEX MICROSCOPIC
BILIRUBIN URINE: NEGATIVE
GLUCOSE, UA: NEGATIVE mg/dL
HGB URINE DIPSTICK: NEGATIVE
KETONES UR: NEGATIVE mg/dL
NITRITE: NEGATIVE
PH: 5 (ref 5.0–8.0)
Protein, ur: NEGATIVE mg/dL
SQUAMOUS EPITHELIAL / LPF: NONE SEEN
Specific Gravity, Urine: 1.018 (ref 1.005–1.030)

## 2017-01-01 LAB — ABO/RH: ABO/RH(D): AB POS

## 2017-01-01 NOTE — Progress Notes (Addendum)
01-01-17 Pt reports last chemotherapy today. Will repeat labs at PAT.  01-01-17 UA routed to Dr. Denman George for review.

## 2017-01-02 NOTE — H&P (Signed)
Alexandra Mathis Location: [facility] Patient #: [id number] DOB: 1955/11/27 Married / Language: English / Race: White Female       History of Present Illness The patient is a 61 year old female who presents with breast cancer. This is a 62 year old female who returns with her husband for another postop visit following definitive breast surgery Dr. Jana Mathis is her medical oncologist. Her primary care provider is Alexandra Mathis, [place] in [place].  Initially diagnosed with cancer of the left breast on December 11, 2015 with a 4 cm area of calcifications and a smaller mass central to this. Irrigated biopsy showed grade 3, invasive duct carcinoma and DCIS, a triple positive tumor. A lymph node biopsy was negative. She developed a hematoma which resolved. Further abnormalities were seen but she did not want any further biopsies and wanted a mastectomy. MRI showed a larger area of extensive non-mass enhancement. Port-A-Cath was inserted. Plastic surgery was involved. She underwent Herceptin-based chemotherapy and had good response. Genetic testing was negative.  On April 21, 2016 she underwent bilateral total mastectomy, left axilla sentinel node biopsy, tissue expander insertion. The tumor had been down staged and was only 1.1 cm. Still triple positive. Nodes negative.     She has completed one year of herceptin. She is referred for port removal.We have discussed indications, technique and risks of this in detail.   Dr. Denman Mathis plans BSO at same time . Dr. Jana Mathis has started her on letrozole    Allergies  Morphine Sulfate *ANALGESICS - OPIOID*  Vicodin *ANALGESICS - OPIOID*  Palpitations.  Medication History  Ondansetron HCl (8MG  Tablet, Oral) Active. Probiotic Product (Oral) Active. Aspirin (81MG  Tablet DR, Oral) Active. Lipitor (20MG  Tablet, Oral) Active. Fiber Complete (Oral) Active. Metoprolol Tartrate (25MG  Tablet, Oral) Active. PriLOSEC (20MG   Capsule DR, Oral) Active. Multiple Vitamin (1 (one) Oral) Active. Vitamin C (500MG  Tablet, Oral) Active. ZyrTEC Allergy (10MG  Tablet, Oral) Active. Nitroglycerin (0.4MG  Tab Sublingual, Sublingual) Active. Medications Reconciled  Vitals  Weight: 171.4 lb Height: 67in Body Surface Area: 1.89 m Body Mass Index: 26.84 kg/m  Temp.: 98.15F  Pulse: 83 (Regular)  BP: 120/72 (Sitting, Left Arm, Standard)    Physical Exam General Note: Alert. Very pleasant. No distress. Husband is with her throughout the encounter.   Head and Neck Note: No adenopathy or mass. Supple.   Chest and Lung Exam Note: Clear to auscultation. Port site right infraclavicular area looks good.  CV: RRR without murmer. Peripheral pulses full.   Breast Note: Bilateral skin sparing mastectomy incision is healing normally. Skin healthy. No fluid collections. Drains are gone. Tissue expanders are tight. No arm swelling. No sensory deficit under her arms. She can move her shoulders around reasonably well. No arm swelling.     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 Dr. Jana Mathis has referred you for removal of port a cath. We have discussed the indications, techniques and risks of this in detail    Since you are being seen frequently by AlexandraThimmappa and Dr. Jana Mathis, return to see Dr. Dalbert Mathis in 6 months  FAMILY HISTORY OF BREAST CANCER (Z80.3) FAMILY HISTORY OF OVARIAN CANCER (Z80.41) HISTORY OF 3 CESAREAN SECTIONS (Z87.59) HISTORY OF ABDOMINAL  HYSTERECTOMY (Z90.710)   Alexandra Mathis. Alexandra Mathis, M.D., [facility] General and  Minimally invasive Surgery Breast and Colorectal Surgery Office:   [phone] Pager:   [phone]

## 2017-01-04 ENCOUNTER — Telehealth: Payer: Self-pay | Admitting: *Deleted

## 2017-01-04 NOTE — Telephone Encounter (Signed)
Returned patient's call regarding diet before surgery. Per Melissa it's ok for the patient to eat cooked meat, information given to the patient.

## 2017-01-04 NOTE — Anesthesia Preprocedure Evaluation (Addendum)
Anesthesia Evaluation  Patient identified by MRN, date of birth, ID band Patient awake    Reviewed: Allergy & Precautions, NPO status , Patient's Chart, lab work & pertinent test results  History of Anesthesia Complications (+) PONV and history of anesthetic complications  Airway Mallampati: II  TM Distance: >3 FB     Dental  (+) Dental Advisory Given   Pulmonary former smoker,    breath sounds clear to auscultation       Cardiovascular negative cardio ROS   Rhythm:Regular Rate:Normal  ECG: NSR, rate 97  ECHO: - Left ventricle: The cavity size was normal. Wall thickness was normal. Systolic function was normal. The estimated ejection fraction was in the range of 55% to 60%. Wall motion was normal; there were no regional wall motion abnormalities. Features are consistent with a pseudonormal left ventricular filling pattern, with concomitant abnormal relaxation and increased filling pressure (grade 2 diastolic dysfunction).    Neuro/Psych negative neurological ROS     GI/Hepatic negative GI ROS, Neg liver ROS,   Endo/Other  negative endocrine ROS  Renal/GU negative Renal ROS     Musculoskeletal  (+) Arthritis ,   Abdominal   Peds  Hematology negative hematology ROS (+)   Anesthesia Other Findings Hyperlipidemia  Chemotherapy  Reproductive/Obstetrics                            Lab Results  Component Value Date   WBC 4.2 01/01/2017   HGB 12.3 01/01/2017   HCT 36.0 01/01/2017   MCV 87.4 01/01/2017   PLT 170 01/01/2017   Lab Results  Component Value Date   CREATININE 0.68 01/01/2017   BUN 18 01/01/2017   NA 143 01/01/2017   K 4.8 01/01/2017   CL 106 01/01/2017   CO2 31 01/01/2017    Anesthesia Physical  Anesthesia Plan  ASA: II  Anesthesia Plan: General   Post-op Pain Management:    Induction: Intravenous  PONV Risk Score and Plan: 4 or greater and Ondansetron,  Dexamethasone, Midazolam, Scopolamine patch - Pre-op and Treatment may vary due to age or medical condition  Airway Management Planned: Oral ETT  Additional Equipment:   Intra-op Plan:   Post-operative Plan: Extubation in OR  Informed Consent: I have reviewed the patients History and Physical, chart, labs and discussed the procedure including the risks, benefits and alternatives for the proposed anesthesia with the patient or authorized representative who has indicated his/her understanding and acceptance.   Dental advisory given  Plan Discussed with: CRNA  Anesthesia Plan Comments:         Anesthesia Quick Evaluation

## 2017-01-05 ENCOUNTER — Encounter (HOSPITAL_COMMUNITY): Payer: Self-pay | Admitting: *Deleted

## 2017-01-05 ENCOUNTER — Encounter (HOSPITAL_COMMUNITY)
Admission: RE | Disposition: A | Discharge status: Home / Self Care | Payer: Self-pay | Source: Ambulatory Visit | Attending: Gynecologic Oncology

## 2017-01-05 ENCOUNTER — Telehealth: Payer: Self-pay | Admitting: *Deleted

## 2017-01-05 ENCOUNTER — Ambulatory Visit (HOSPITAL_COMMUNITY): Payer: BC Managed Care – PPO | Admitting: Anesthesiology

## 2017-01-05 ENCOUNTER — Ambulatory Visit (HOSPITAL_COMMUNITY)
Admission: RE | Admit: 2017-01-05 | Discharge: 2017-01-05 | Disposition: A | Discharge status: Home / Self Care | Payer: BC Managed Care – PPO | Source: Ambulatory Visit | Attending: Gynecologic Oncology | Admitting: Gynecologic Oncology

## 2017-01-05 DIAGNOSIS — Z853 Personal history of malignant neoplasm of breast: Secondary | ICD-10-CM

## 2017-01-05 DIAGNOSIS — N800 Endometriosis of uterus: Secondary | ICD-10-CM | POA: Diagnosis not present

## 2017-01-05 DIAGNOSIS — Z9013 Acquired absence of bilateral breasts and nipples: Secondary | ICD-10-CM | POA: Diagnosis not present

## 2017-01-05 DIAGNOSIS — Z87891 Personal history of nicotine dependence: Secondary | ICD-10-CM | POA: Insufficient documentation

## 2017-01-05 DIAGNOSIS — Z4002 Encounter for prophylactic removal of ovary: Secondary | ICD-10-CM

## 2017-01-05 DIAGNOSIS — Z8041 Family history of malignant neoplasm of ovary: Secondary | ICD-10-CM

## 2017-01-05 DIAGNOSIS — Z803 Family history of malignant neoplasm of breast: Secondary | ICD-10-CM | POA: Diagnosis not present

## 2017-01-05 DIAGNOSIS — Z808 Family history of malignant neoplasm of other organs or systems: Secondary | ICD-10-CM | POA: Insufficient documentation

## 2017-01-05 DIAGNOSIS — Z79899 Other long term (current) drug therapy: Secondary | ICD-10-CM | POA: Diagnosis not present

## 2017-01-05 DIAGNOSIS — Z8249 Family history of ischemic heart disease and other diseases of the circulatory system: Secondary | ICD-10-CM | POA: Diagnosis not present

## 2017-01-05 DIAGNOSIS — E785 Hyperlipidemia, unspecified: Secondary | ICD-10-CM | POA: Diagnosis not present

## 2017-01-05 DIAGNOSIS — Z1502 Genetic susceptibility to malignant neoplasm of ovary: Secondary | ICD-10-CM

## 2017-01-05 DIAGNOSIS — Z885 Allergy status to narcotic agent status: Secondary | ICD-10-CM | POA: Insufficient documentation

## 2017-01-05 DIAGNOSIS — Z9221 Personal history of antineoplastic chemotherapy: Secondary | ICD-10-CM | POA: Diagnosis not present

## 2017-01-05 DIAGNOSIS — K682 Retroperitoneal fibrosis: Secondary | ICD-10-CM

## 2017-01-05 DIAGNOSIS — N135 Crossing vessel and stricture of ureter without hydronephrosis: Secondary | ICD-10-CM | POA: Diagnosis not present

## 2017-01-05 DIAGNOSIS — Z7982 Long term (current) use of aspirin: Secondary | ICD-10-CM | POA: Diagnosis not present

## 2017-01-05 DIAGNOSIS — Z1589 Genetic susceptibility to other disease: Secondary | ICD-10-CM

## 2017-01-05 DIAGNOSIS — Z1509 Genetic susceptibility to other malignant neoplasm: Secondary | ICD-10-CM

## 2017-01-05 HISTORY — PX: ROBOTIC ASSISTED BILATERAL SALPINGO OOPHERECTOMY: SHX6078

## 2017-01-05 HISTORY — PX: PORT-A-CATH REMOVAL: SHX5289

## 2017-01-05 LAB — TYPE AND SCREEN
ABO/RH(D): AB POS
Antibody Screen: NEGATIVE

## 2017-01-05 SURGERY — SALPINGO-OOPHORECTOMY, BILATERAL, ROBOT-ASSISTED
Anesthesia: General

## 2017-01-05 MED ORDER — PROMETHAZINE HCL 25 MG/ML IJ SOLN: 6.2500 mg | INTRAMUSCULAR | Status: DC | PRN

## 2017-01-05 MED ORDER — BUPIVACAINE-EPINEPHRINE 0.5% -1:200000 IJ SOLN
INTRAMUSCULAR | Status: DC | PRN
  Administered 2017-01-05: 8 mL

## 2017-01-05 MED ORDER — BUPIVACAINE-EPINEPHRINE (PF) 0.5% -1:200000 IJ SOLN
INTRAMUSCULAR | Status: AC
  Filled 2017-01-05: qty 30

## 2017-01-05 MED ORDER — LACTATED RINGERS IV SOLN
INTRAVENOUS | Status: AC | PRN
  Administered 2017-01-05: 1000 mL

## 2017-01-05 MED ORDER — HYDROMORPHONE HCL 2 MG PO TABS: 2.0000 mg | ORAL_TABLET | ORAL | 0 refills | Status: DC | PRN

## 2017-01-05 MED ORDER — LIDOCAINE 2% (20 MG/ML) 5 ML SYRINGE
INTRAMUSCULAR | Status: DC | PRN
  Administered 2017-01-05: 80 mg via INTRAVENOUS

## 2017-01-05 MED ORDER — MIDAZOLAM HCL 2 MG/2ML IJ SOLN
INTRAMUSCULAR | Status: AC
  Filled 2017-01-05: qty 2

## 2017-01-05 MED ORDER — SODIUM CHLORIDE 0.9 % IV SOLN: 250.0000 mL | INTRAVENOUS | Status: DC | PRN

## 2017-01-05 MED ORDER — SUGAMMADEX SODIUM 200 MG/2ML IV SOLN
INTRAVENOUS | Status: AC
  Filled 2017-01-05: qty 2

## 2017-01-05 MED ORDER — HYDROMORPHONE HCL-NACL 0.5-0.9 MG/ML-% IV SOSY: 0.2500 mg | PREFILLED_SYRINGE | INTRAVENOUS | Status: DC | PRN

## 2017-01-05 MED ORDER — CEFAZOLIN SODIUM-DEXTROSE 2-4 GM/100ML-% IV SOLN
2.0000 g | INTRAVENOUS | Status: AC
  Administered 2017-01-05: 2 g via INTRAVENOUS

## 2017-01-05 MED ORDER — MIDAZOLAM HCL 5 MG/5ML IJ SOLN
INTRAMUSCULAR | Status: DC | PRN
  Administered 2017-01-05: 2 mg via INTRAVENOUS

## 2017-01-05 MED ORDER — LIDOCAINE HCL (CARDIAC) 20 MG/ML IV SOLN: INTRAVENOUS | Status: DC | PRN

## 2017-01-05 MED ORDER — LACTATED RINGERS IV SOLN
INTRAVENOUS | Status: DC | PRN
  Administered 2017-01-05: 07:00:00 via INTRAVENOUS

## 2017-01-05 MED ORDER — STERILE WATER FOR IRRIGATION IR SOLN
Status: DC | PRN
  Administered 2017-01-05: 1000 mL

## 2017-01-05 MED ORDER — FAMOTIDINE IN NACL 20-0.9 MG/50ML-% IV SOLN
20.0000 mg | Freq: Once | INTRAVENOUS | Status: AC
  Administered 2017-01-05: 20 mg via INTRAVENOUS
  Filled 2017-01-05: qty 50

## 2017-01-05 MED ORDER — ONDANSETRON HCL 4 MG/2ML IJ SOLN
INTRAMUSCULAR | Status: DC | PRN
  Administered 2017-01-05: 4 mg via INTRAVENOUS

## 2017-01-05 MED ORDER — PROMETHAZINE HCL 25 MG/ML IJ SOLN
6.2500 mg | INTRAMUSCULAR | Status: AC
  Administered 2017-01-05: 6.25 mg via INTRAVENOUS
  Filled 2017-01-05: qty 1

## 2017-01-05 MED ORDER — CEFAZOLIN SODIUM-DEXTROSE 2-4 GM/100ML-% IV SOLN
INTRAVENOUS | Status: AC
  Filled 2017-01-05: qty 100

## 2017-01-05 MED ORDER — PROPOFOL 10 MG/ML IV BOLUS
INTRAVENOUS | Status: AC
Start: 2017-01-05 — End: ?
  Filled 2017-01-05: qty 20

## 2017-01-05 MED ORDER — PROPOFOL 10 MG/ML IV BOLUS
INTRAVENOUS | Status: DC | PRN
  Administered 2017-01-05: 150 mg via INTRAVENOUS

## 2017-01-05 MED ORDER — 0.9 % SODIUM CHLORIDE (POUR BTL) OPTIME
TOPICAL | Status: DC | PRN
  Administered 2017-01-05: 1000 mL

## 2017-01-05 MED ORDER — FENTANYL CITRATE (PF) 100 MCG/2ML IJ SOLN: 25.0000 ug | INTRAMUSCULAR | Status: DC | PRN

## 2017-01-05 MED ORDER — FENTANYL CITRATE (PF) 250 MCG/5ML IJ SOLN
INTRAMUSCULAR | Status: AC
  Filled 2017-01-05: qty 5

## 2017-01-05 MED ORDER — SUCCINYLCHOLINE CHLORIDE 200 MG/10ML IV SOSY
PREFILLED_SYRINGE | INTRAVENOUS | Status: AC
  Filled 2017-01-05: qty 10

## 2017-01-05 MED ORDER — SUGAMMADEX SODIUM 200 MG/2ML IV SOLN
INTRAVENOUS | Status: DC | PRN
  Administered 2017-01-05: 200 mg via INTRAVENOUS

## 2017-01-05 MED ORDER — SCOPOLAMINE 1 MG/3DAYS TD PT72
MEDICATED_PATCH | TRANSDERMAL | Status: DC | PRN
  Administered 2017-01-05: 1 via TRANSDERMAL

## 2017-01-05 MED ORDER — LACTATED RINGERS IV SOLN: INTRAVENOUS | Status: DC

## 2017-01-05 MED ORDER — ROCURONIUM BROMIDE 10 MG/ML (PF) SYRINGE
PREFILLED_SYRINGE | INTRAVENOUS | Status: DC | PRN
  Administered 2017-01-05: 40 mg via INTRAVENOUS

## 2017-01-05 MED ORDER — SODIUM CHLORIDE 0.9% FLUSH: 3.0000 mL | Freq: Two times a day (BID) | INTRAVENOUS | Status: DC

## 2017-01-05 MED ORDER — PHENYLEPHRINE 40 MCG/ML (10ML) SYRINGE FOR IV PUSH (FOR BLOOD PRESSURE SUPPORT)
PREFILLED_SYRINGE | INTRAVENOUS | Status: AC
  Filled 2017-01-05: qty 10

## 2017-01-05 MED ORDER — ROCURONIUM BROMIDE 50 MG/5ML IV SOSY
PREFILLED_SYRINGE | INTRAVENOUS | Status: AC
  Filled 2017-01-05: qty 5

## 2017-01-05 MED ORDER — FENTANYL CITRATE (PF) 100 MCG/2ML IJ SOLN
INTRAMUSCULAR | Status: DC | PRN
  Administered 2017-01-05: 50 ug via INTRAVENOUS
  Administered 2017-01-05: 100 ug via INTRAVENOUS
  Administered 2017-01-05 (×2): 50 ug via INTRAVENOUS

## 2017-01-05 MED ORDER — ACETAMINOPHEN 325 MG PO TABS: 650.0000 mg | ORAL_TABLET | ORAL | Status: DC | PRN

## 2017-01-05 MED ORDER — ACETAMINOPHEN 650 MG RE SUPP
650.0000 mg | RECTAL | Status: DC | PRN
  Filled 2017-01-05: qty 1

## 2017-01-05 MED ORDER — SUCCINYLCHOLINE CHLORIDE 200 MG/10ML IV SOSY
PREFILLED_SYRINGE | INTRAVENOUS | Status: DC | PRN
  Administered 2017-01-05: 100 mg via INTRAVENOUS

## 2017-01-05 MED ORDER — SODIUM CHLORIDE 0.9% FLUSH: 3.0000 mL | INTRAVENOUS | Status: DC | PRN

## 2017-01-05 MED ORDER — PHENYLEPHRINE HCL 10 MG/ML IJ SOLN: INTRAMUSCULAR | Status: DC | PRN

## 2017-01-05 MED ORDER — DEXAMETHASONE SODIUM PHOSPHATE 10 MG/ML IJ SOLN
INTRAMUSCULAR | Status: AC
  Filled 2017-01-05: qty 1

## 2017-01-05 MED ORDER — BUPIVACAINE LIPOSOME 1.3 % IJ SUSP
20.0000 mL | Freq: Once | INTRAMUSCULAR | Status: AC
  Administered 2017-01-05: 20 mL
  Filled 2017-01-05: qty 20

## 2017-01-05 MED ORDER — HYDROMORPHONE HCL 2 MG PO TABS: 2.0000 mg | ORAL_TABLET | ORAL | Status: DC | PRN

## 2017-01-05 MED ORDER — PHENYLEPHRINE 40 MCG/ML (10ML) SYRINGE FOR IV PUSH (FOR BLOOD PRESSURE SUPPORT)
PREFILLED_SYRINGE | INTRAVENOUS | Status: DC | PRN
  Administered 2017-01-05 (×2): 80 ug via INTRAVENOUS

## 2017-01-05 MED ORDER — LIDOCAINE 2% (20 MG/ML) 5 ML SYRINGE
INTRAMUSCULAR | Status: AC
  Filled 2017-01-05: qty 5

## 2017-01-05 MED ORDER — SCOPOLAMINE 1 MG/3DAYS TD PT72
MEDICATED_PATCH | TRANSDERMAL | Status: AC
  Filled 2017-01-05: qty 1

## 2017-01-05 MED ORDER — DEXAMETHASONE SODIUM PHOSPHATE 4 MG/ML IJ SOLN
INTRAMUSCULAR | Status: DC | PRN
  Administered 2017-01-05: 10 mg via INTRAVENOUS

## 2017-01-05 MED ORDER — ONDANSETRON HCL 4 MG/2ML IJ SOLN
INTRAMUSCULAR | Status: AC
  Filled 2017-01-05: qty 2

## 2017-01-05 SURGICAL SUPPLY — 66 items
ADH SKN CLS APL DERMABOND .7 (GAUZE/BANDAGES/DRESSINGS) ×2
APL SKNCLS STERI-STRIP NONHPOA (GAUZE/BANDAGES/DRESSINGS) ×2
BAG LAPAROSCOPIC 12 15 PORT 16 (BASKET) IMPLANT
BAG RETRIEVAL 12/15 (BASKET)
BAG SPEC RTRVL LRG 6X4 10 (ENDOMECHANICALS) ×4
BENZOIN TINCTURE PRP APPL 2/3 (GAUZE/BANDAGES/DRESSINGS) ×1 IMPLANT
CHLORAPREP W/TINT 26ML (MISCELLANEOUS) ×6 IMPLANT
COVER BACK TABLE 60X90IN (DRAPES) ×3 IMPLANT
COVER SURGICAL LIGHT HANDLE (MISCELLANEOUS) ×3 IMPLANT
COVER TIP SHEARS 8 DVNC (MISCELLANEOUS) ×2 IMPLANT
COVER TIP SHEARS 8MM DA VINCI (MISCELLANEOUS) ×1
DECANTER SPIKE VIAL GLASS SM (MISCELLANEOUS) ×3 IMPLANT
DERMABOND ADVANCED (GAUZE/BANDAGES/DRESSINGS) ×1
DERMABOND ADVANCED .7 DNX12 (GAUZE/BANDAGES/DRESSINGS) IMPLANT
DRAPE ARM DVNC X/XI (DISPOSABLE) ×8 IMPLANT
DRAPE COLUMN DVNC XI (DISPOSABLE) ×2 IMPLANT
DRAPE DA VINCI XI ARM (DISPOSABLE) ×4
DRAPE DA VINCI XI COLUMN (DISPOSABLE) ×1
DRAPE LAPAROTOMY TRNSV 102X78 (DRAPE) ×5 IMPLANT
DRAPE SHEET LG 3/4 BI-LAMINATE (DRAPES) ×6 IMPLANT
DRAPE SURG IRRIG POUCH 19X23 (DRAPES) ×3 IMPLANT
DRSG TEGADERM 4X4.75 (GAUZE/BANDAGES/DRESSINGS) ×2 IMPLANT
ELECT PENCIL ROCKER SW 15FT (MISCELLANEOUS) ×3 IMPLANT
ELECT REM PT RETURN 15FT ADLT (MISCELLANEOUS) ×6 IMPLANT
GAUZE SPONGE 4X4 16PLY XRAY LF (GAUZE/BANDAGES/DRESSINGS) ×3 IMPLANT
GLOVE BIO SURGEON STRL SZ 6 (GLOVE) ×12 IMPLANT
GLOVE BIO SURGEON STRL SZ 6.5 (GLOVE) ×6 IMPLANT
GLOVE BIOGEL PI IND STRL 7.0 (GLOVE) ×2 IMPLANT
GLOVE BIOGEL PI INDICATOR 7.0 (GLOVE) ×1
GLOVE EUDERMIC 7 POWDERFREE (GLOVE) ×3 IMPLANT
GOWN STRL REUS W/ TWL LRG LVL3 (GOWN DISPOSABLE) ×6 IMPLANT
GOWN STRL REUS W/TWL LRG LVL3 (GOWN DISPOSABLE) ×12 IMPLANT
GOWN STRL REUS W/TWL XL LVL3 (GOWN DISPOSABLE) ×6 IMPLANT
HOLDER FOLEY CATH W/STRAP (MISCELLANEOUS) ×3 IMPLANT
IRRIG SUCT STRYKERFLOW 2 WTIP (MISCELLANEOUS) ×3
IRRIGATION SUCT STRKRFLW 2 WTP (MISCELLANEOUS) ×2 IMPLANT
KIT BASIN OR (CUSTOM PROCEDURE TRAY) ×3 IMPLANT
LEGGING LITHOTOMY PAIR STRL (DRAPES) ×1 IMPLANT
MANIPULATOR UTERINE 4.5 ZUMI (MISCELLANEOUS) IMPLANT
NDL HYPO 25X1 1.5 SAFETY (NEEDLE) ×1 IMPLANT
NEEDLE HYPO 25X1 1.5 SAFETY (NEEDLE) ×3 IMPLANT
OBTURATOR OPTICAL STANDARD 8MM (TROCAR) ×1
OBTURATOR OPTICAL STND 8 DVNC (TROCAR) ×2
OBTURATOR OPTICALSTD 8 DVNC (TROCAR) ×2 IMPLANT
PACK BASIC VI WITH GOWN DISP (CUSTOM PROCEDURE TRAY) ×3 IMPLANT
PACK ROBOT GYN CUSTOM WL (TRAY / TRAY PROCEDURE) ×3 IMPLANT
PAD POSITIONING PINK XL (MISCELLANEOUS) ×3 IMPLANT
POUCH SPECIMEN RETRIEVAL 10MM (ENDOMECHANICALS) ×4 IMPLANT
SEAL CANN UNIV 5-8 DVNC XI (MISCELLANEOUS) ×8 IMPLANT
SEAL XI 5MM-8MM UNIVERSAL (MISCELLANEOUS) ×4
SET TRI-LUMEN FLTR TB AIRSEAL (TUBING) IMPLANT
SOLUTION ELECTROLUBE (MISCELLANEOUS) ×3 IMPLANT
STRIP CLOSURE SKIN 1/2X4 (GAUZE/BANDAGES/DRESSINGS) IMPLANT
SUT MNCRL AB 4-0 PS2 18 (SUTURE) ×3 IMPLANT
SUT VIC AB 0 CT1 27 (SUTURE)
SUT VIC AB 0 CT1 27XBRD ANTBC (SUTURE) IMPLANT
SUT VIC AB 3-0 SH 27 (SUTURE) ×3
SUT VIC AB 3-0 SH 27XBRD (SUTURE) ×2 IMPLANT
SYR CONTROL 10ML LL (SYRINGE) ×3 IMPLANT
TAPE STRIPS DRAPE STRL (GAUZE/BANDAGES/DRESSINGS) ×2 IMPLANT
TOWEL OR 17X26 10 PK STRL BLUE (TOWEL DISPOSABLE) ×3 IMPLANT
TOWEL OR NON WOVEN STRL DISP B (DISPOSABLE) ×3 IMPLANT
TRAP SPECIMEN MUCOUS 40CC (MISCELLANEOUS) ×2 IMPLANT
TRAY FOLEY W/METER SILVER 16FR (SET/KITS/TRAYS/PACK) ×3 IMPLANT
UNDERPAD 30X30 (UNDERPADS AND DIAPERS) ×3 IMPLANT
WATER STERILE IRR 1000ML POUR (IV SOLUTION) ×3 IMPLANT

## 2017-01-05 NOTE — Progress Notes (Addendum)
Patient sat on edge of bed prior to ambulating to bathroom. Patient became very nauseated and had to lie back down. Paged Dr Roanna Banning. Orders received for phenergan

## 2017-01-05 NOTE — H&P (Signed)
Consult Note: Gyn-Onc  Consult was requested by Dr. Jana Hakim and Dr Sabra Heck for the evaluation of Alexandra Mathis 61 y.o. female  CC:     Chief Complaint  Patient presents with  . Family History of Breast Cancer  Deleterious mutation of DICER1  Assessment/Plan:  Alexandra Mathis  is a 61 y.o.  year old with a deleterious mutation of DICER1 gene, a personal history of breast cancer who desires risk reducing BSO.  I discussed with the patient that the [place] mutation carries an unknown risk for ovarian cancer (possibly approximately 1 in 88). I discussed that BSO was associated with risk for  bleeding, infection, damage to internal organs (such as bladder,ureters, bowels), blood clot, reoperation and rehospitalization. These risks are higher for her due to her history of prior surgeries (multiple). I discussed that BSO prior to age 33 is also associated with increased all cause mortality, particularly for cardiovascular events.   She is electing for surgery in August. We discussed that she should stop ASA 10 to 14 days preop and discussed postop recovery.  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  The patient has a remote history of adenomyosis and endometriosis (had multiple laparoscopies). She had 4 cesarean sections. At the time of her 4th she had a cesarean hysterectomy.  She subsequently developed breast cancer at age 61. Her tumor was stage I and ER/PR/Her2Neu positive. She was treated with chemotherapy and surgery (bilateral mastectomies). She is completing Herceptin therapy at present. Her cousin has a strong family history of cancer (breast, colon, uterine). The patient was tested with genetics and found to have a mutation in DICER1 of uncertain significance.  [place] on 03/26/16 showed normal ovaries  bilaterally.   Current Meds:      Outpatient Encounter Prescriptions as of 11/16/2016  Medication Sig  . anastrozole (ARIMIDEX) 1 MG tablet Take 1 tablet (1 mg total) by mouth daily.  Marland Kitchen ascorbic acid (VITAMIN C) 250 MG CHEW Chew 500 mg by mouth daily. In the winter months  . aspirin EC 81 MG tablet Take 81 mg by mouth daily.  Marland Kitchen atorvastatin (LIPITOR) 40 MG tablet Take 40 mg by mouth daily.  . Cetirizine HCl 10 MG CAPS Take 10 mg by mouth daily as needed. allergies  . Cholecalciferol 1000 units capsule Take 2,000 Units by mouth daily.   Marland Kitchen etodolac (LODINE) 400 MG tablet Take 400 mg by mouth 2 (two) times daily as needed for mild pain.   Marland Kitchen FIBER COMPLETE PO Take 5 tablets by mouth daily.   . Multiple Vitamin (MULTIVITAMIN) tablet Take 1 tablet by mouth daily.    . nitroGLYCERIN (NITROLINGUAL) 0.4 MG/SPRAY spray Place 1 spray under the tongue every 5 (five) minutes x 3 doses as needed for chest pain.  . Probiotic Product (ALIGN PO) Take 1 tablet by mouth daily.   Marland Kitchen sulfamethoxazole-trimethoprim (BACTRIM DS,SEPTRA DS) 800-160 MG tablet Take 1 tablet by mouth 2 (two) times daily.  . vitamin B-12 (CYANOCOBALAMIN) 500 MCG tablet Take 500 mcg by mouth daily.  . Vitamins C E (CRANBERRY CONCENTRATE PO) Take 1 tablet by mouth daily.       Facility-Administered Encounter Medications as of 11/16/2016  Medication  . sodium chloride 0.9 % injection 10 mL    Allergy:  Allergies  Allergen Reactions  . Codeine Itching    Can not tolerate tylenol #3  . Morphine And Related Hives, Nausea And Vomiting and Rash  . Vicodin [Hydrocodone-Acetaminophen] Palpitations    Social Hx:   Social History        Social History  . Marital status: Married    Spouse name: N/A  . Number of children: 4  . Years of education: N/A       Occupational History  . teacher          Social History Main Topics  . Smoking status: Former Smoker    Years: 0.25    Types: Cigarettes    Quit  date: 05/25/1974  . Smokeless tobacco: Never Used     Comment: was only a social/weekend smoker; smoked for a couple months total  . Alcohol use 0.5 oz/week    1 Standard drinks or equivalent per week     Comment: 04/21/2016 "1 glass wine a few times/year"  . Drug use: No     Comment: last used 25 yrs ago  . Sexual activity: Yes    Birth control/ protection: Surgical, Post-menopausal       Other Topics Concern  . Not on file      Social History Narrative  . No narrative on file    Past Surgical Hx:       Past Surgical History:  Procedure Laterality Date  . ABDOMINAL HYSTERECTOMY  1990   done with last cesarean section; "still have my ovaries"  . ANTERIOR CRUCIATE LIGAMENT REPAIR Right   . BREAST BIOPSY Left 2017  . BREAST BIOPSY Right 2017   "MRI guided; benign"  . BREAST RECONSTRUCTION WITH PLACEMENT OF TISSUE EXPANDER AND FLEX HD (ACELLULAR HYDRATED DERMIS) Bilateral 04/21/2016  . BREAST RECONSTRUCTION WITH PLACEMENT OF TISSUE EXPANDER AND FLEX HD (ACELLULAR HYDRATED DERMIS) Bilateral 04/21/2016   Procedure: BREAST RECONSTRUCTION WITH PLACEMENT OF TISSUE EXPANDER AND ALLODERM;  Surgeon: Irene Limbo, MD;  Location: [facility];  Service: Plastics;  Laterality: Bilateral;  . [place]; 1983; 1987; 1990  . CESAREAN SECTION     done with last cesarean section  . CYST EXCISION  2007   on abdomen- 2 cyst removed; "benign"  . DILATION AND CURETTAGE OF UTERUS  ~ 1980; 1989  . GANGLION CYST EXCISION Right ~ 1975  . LAPAROSCOPIC ENDOMETRIOSIS FULGURATION  1989   w/D&C  . MASTECTOMY Bilateral 04/21/2016   LEFT SKIN SPARING MASTECTOMY WITH LEFT SENTINAL LYMPH NODE BIOPSY, RIGHT PROPHYLATIC SKIN SPARING MASTECTOMY   . NIPPLE SPARING MASTECTOMY/SENTINAL LYMPH NODE BIOPSY/RECONSTRUCTION/PLACEMENT OF TISSUE EXPANDER Bilateral 04/21/2016   Procedure: LEFT SKIN SPARING MASTECTOMY WITH LEFT SENTINAL LYMPH NODE BIOPSY, RIGHT PROPHYLATIC SKIN  SPARING MASTECTOMY;  Surgeon: Fanny Skates, MD;  Location: [facility];  Service: General;  Laterality: Bilateral;  . PORTACATH PLACEMENT Right 12/25/2015   Procedure: INSERTION PORT-A-CATH WITH [place];  Surgeon: Fanny Skates, MD;  Location: [facility];  Service: General;  Laterality: Right;  . REMOVAL OF BILATERAL TISSUE EXPANDERS WITH PLACEMENT OF BILATERAL BREAST IMPLANTS Bilateral 07/28/2016   Procedure: REMOVAL OF BILATERAL TISSUE EXPANDERS WITH PLACEMENT OF BILATERAL SILICONE BREAST IMPLANTS;  Surgeon: Irene Limbo, MD;  Location: [facility];  Service: Plastics;  Laterality: Bilateral;  . TONSILLECTOMY    . WISDOM TOOTH EXTRACTION  1978    Past Medical Hx:      Past Medical History:  Diagnosis Date  . Anemia   . Arthritis    "mostly in my  hands; alot in my knees" (04/21/2016)  . Breast cancer of upper-outer quadrant of left female breast ([place]) 12/05/2015  . Chemotherapy management, encounter for 12/27/15 started  . Chest pain   . Family history of premature CAD   . Fibroid   . HLD (hyperlipidemia)   . Neuropathy    feet  . Pneumonia 1996; 2006  . PONV (postoperative nausea and vomiting)    scop patch works well    Past Gynecological History: c/s x 4  S/p hysterectomy. No LMP recorded. Patient has had a hysterectomy.  Family Hx:       Family History  Problem Relation Age of Onset  . Other Mother        benign meningioma dx 86 s/p surgery; hx of hysterectomy for hemorrhaging  . Hypothyroidism Mother   . CAD Mother   . Colon cancer Father 66       w/ mets to lungs and liver; + chemo; d. 8  . Gout Father   . Arthritis Father   . Ovarian cancer Sister        early 24s; "precancerous cells" s/p USO  . Breast cancer Sister 31       s/p mastectomy and aromatase inhibitor  . Other Paternal Aunt 62       Primary peritoneal cancer; s/p surgery  . Cancer Paternal Uncle   . Ovarian cancer Sister 60       "pre-cancer"; s/p BSO  .  Cervical cancer Sister   . Basal cell carcinoma Sister   . Colon polyps Sister        unspecified number  . Breast cancer Cousin 11       s/p mastectomy  . Ovarian cancer Cousin 73       s/p BSO  . Leukemia Cousin 30  . Cancer Cousin        dx carcinoid bowel tumor  . Other Cousin        reportedly negative "BRCA1/2 testing"  . Basal cell carcinoma Cousin   . Breast cancer Cousin 85       reportedly negative GT is 2014-2015  . CAD Maternal Grandmother        d. 90s  . Heart attack Maternal Grandfather 51  . Heart Problems Paternal Grandmother   . Heart Problems Paternal Grandfather   . CAD Brother        triple vessel CAD, d. 37  . Other Son        enlarged prostate w/ surveillance  . Thyroid cancer Maternal Uncle 71       unspecified type  . CAD Maternal Uncle        d. 18  . Other Cousin 48       dx benign "skin polyp"  . Breast cancer Other        paternal great aunt (PGM's sister)  . Breast cancer Other        paternal great aunt (PGM's sister)    Review of Systems:  Constitutional  Feels well,    ENT Normal appearing ears and nares bilaterally Skin/Breast  No rash, sores, jaundice, itching, dryness Cardiovascular  No chest pain, shortness of breath, or edema  Pulmonary  No cough or wheeze.  Gastro Intestinal  No nausea, vomitting, or diarrhoea. No bright red blood per rectum, no abdominal pain, change in bowel movement, or constipation.  Genito Urinary  No frequency, urgency, dysuria,  Musculo Skeletal  No myalgia, arthralgia, joint swelling or pain  Neurologic  No weakness, numbness, change in gait,  Psychology  No depression, anxiety, insomnia.   Vitals:  Blood pressure 131/87, pulse 83, temperature 98.1 F (36.7 C), resp. rate 18, height '5\' 7"'  (1.702 m), weight 163 lb 3.2 oz (74 kg), SpO2 99 %.  Physical Exam: WD in NAD Neck  Supple NROM, without any enlargements.  Lymph Node Survey No cervical  supraclavicular or inguinal adenopathy Cardiovascular  Pulse normal rate, regularity and rhythm. S1 and S2 normal.  Lungs  Clear to auscultation bilateraly, without wheezes/crackles/rhonchi. Good air movement.  Skin  No rash/lesions/breakdown  Psychiatry  Alert and oriented to person, place, and time  Abdomen  Normoactive bowel sounds, abdomen soft, non-tender and nonobese without evidence of hernia.  Back No CVA tenderness Genito Urinary  Vulva/vagina: Normal external female genitalia.  No lesions. No discharge or bleeding.             Bladder/urethra:  No lesions or masses, well supported bladder             Vagina: normal             Cervix and uterus: surgically absent             Adnexa: no palpable masses. Rectal  deferred Extremities  No bilateral cyanosis, clubbing or edema.  Donaciano Eva, MD

## 2017-01-05 NOTE — Discharge Instructions (Addendum)
General Anesthesia, Adult, Care After These instructions provide you with information about caring for yourself after your procedure. Your health care provider may also give you more specific instructions. Your treatment has been planned according to current medical practices, but problems sometimes occur. Call your health care provider if you have any problems or questions after your procedure. What can I expect after the procedure? After the procedure, it is common to have: Vomiting. A sore throat. Mental slowness.  It is common to feel: Nauseous. Cold or shivery. Sleepy. Tired. Sore or achy, even in parts of your body where you did not have surgery.  Follow these instructions at home: For at least 24 hours after the procedure: Do not: Participate in activities where you could fall or become injured. Drive. Use heavy machinery. Drink alcohol. Take sleeping pills or medicines that cause drowsiness. Make important decisions or sign legal documents. Take care of children on your own. Rest. Eating and drinking If you vomit, drink water, juice, or soup when you can drink without vomiting. Drink enough fluid to keep your urine clear or pale yellow. Make sure you have little or no nausea before eating solid foods. Follow the diet recommended by your health care provider. General instructions Have a responsible adult stay with you until you are awake and alert. Return to your normal activities as told by your health care provider. Ask your health care provider what activities are safe for you. Take over-the-counter and prescription medicines only as told by your health care provider. If you smoke, do not smoke without supervision. Keep all follow-up visits as told by your health care provider. This is important. Contact a health care provider if: You continue to have nausea or vomiting at home, and medicines are not helpful. You cannot drink fluids or start eating again. You cannot  urinate after 8-12 hours. You develop a skin rash. You have fever. You have increasing redness at the site of your procedure. Get help right away if: You have difficulty breathing. You have chest pain. You have unexpected bleeding. You feel that you are having a life-threatening or urgent problem. This information is not intended to replace advice given to you by your health care provider. Make sure you discuss any questions you have with your health care provider. Document Released: 08/17/2000 Document Revised: 10/14/2015 Document Reviewed: 04/25/2015 Elsevier Interactive Patient Education  2018 [place] a cath post op instructions:    OK to shower in 24 hours. No tub baths.    Remove steri strips in 10 days or so.    Ice pack to wound for 10 minutes at a time for 24 hours. Ice no necessary thereafter.   [name]. Dalbert Batman, M.D., [facility] General and Minimally invasive Surgery Breast and Colorectal Surgery Office:   [phone] Unilateral Salpingo-Oophorectomy, Care After Refer to this sheet in the next few weeks. These instructions provide you with information on caring for yourself after your procedure. Your health care provider may also give you more specific instructions. Your treatment has been planned according to current medical practices, but problems sometimes occur. Call your health care provider if you have any problems or questions after your procedure. What can I expect after the procedure? After the procedure, it is typical to have the following:  Abdominal pain that can be controlled with pain medicine.  Vaginal spotting.  Constipation.  Follow these instructions at home:  Get plenty of rest and sleep.  Only  take over-the-counter or prescription medicines as directed by your health care provider. Do not take aspirin. It can cause bleeding.  Keep incision areas clean and dry. Remove or change any bandages (dressings)  only as directed by your health care provider.  Follow your health care provider's advice regarding diet.  Drink enough fluids to keep your urine clear or pale yellow.  Limit exercise and activities as directed by your health care provider. Do not lift anything heavier than 5 pounds (2.3 kg) until your health care provider approves.  Do not drive until your health care provider approves.  Do not drink alcohol until your health care provider approves.  Do not have sexual intercourse until your health care provider says it is OK.  Take your temperature twice a day and write it down.  If you become constipated, you may: ? Ask your health care provider about taking a mild laxative. ? Add more fruit and bran to your diet. ? Drink more fluids.  Follow up with your health care provider as directed. Contact a health care provider if:  You have swelling or redness in the incision area.  You develop a rash.  You feel lightheaded.  You have pain that is not controlled with medicine.  You have pain, swelling, or redness where the IV access tube was placed. Get help right away if:  You have a fever.  You develop increasing abdominal pain.  You see pus coming out of the incision, or the incision is separating.  You notice a bad smell coming from the wound or dressing.  You have excessive vaginal bleeding.  You feel sick to your stomach (nauseous) and vomit.  You have leg or chest pain.  You have pain when you urinate.  You develop shortness of breath.  You pass out. This information is not intended to replace advice given to you by your health care provider. Make sure you discuss any questions you have with your health care provider. Document Released: 03/07/2009 Document Revised: 04/10/2016 Document Reviewed: 11/02/2012 Elsevier Interactive Patient Education  [employer].   Return to work: 2 weeks  Activity: 1. Be up and out of the bed during the day.  Take a  nap if needed.  You may walk up steps but be careful and use the hand rail.  Stair climbing will tire you more than you think, you may need to stop part way and rest.   2. No lifting or straining for 6 weeks.  3. No driving for 1-2 weeks.  Do Not drive if you are taking narcotic pain medicine.  4. Shower daily.  Use soap and water on your incision and pat dry; don't rub.   5. No sexual activity and nothing in the vagina for 4 weeks.  Diet: 1. Low sodium Heart Healthy Diet is recommended.  2. It is safe to use a laxative if you have difficulty moving your bowels.   Wound Care: 1. Keep clean and dry.  Shower daily.  Reasons to call the Doctor:   Fever - Oral temperature greater than 100.4 degrees Fahrenheit  Foul-smelling vaginal discharge  Difficulty urinating  Nausea and vomiting  Increased pain at the site of the incision that is unrelieved with pain medicine.  Difficulty breathing with or without chest pain  New calf pain especially if only on one side  Sudden, continuing increased vaginal bleeding with or without clots.   Follow-up: 1. See Everitt Amber in 4 weeks.  Contacts: For questions or concerns  you should contact:  Dr. Everitt Amber at [phone]  or at [place]

## 2017-01-05 NOTE — Telephone Encounter (Signed)
Per staff message patient scheduled for post op appt. Patient to receive appt with discharge summary

## 2017-01-05 NOTE — Op Note (Signed)
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  Assistant Surgeon: Lahoma Crocker M.D. (an MD assistant was necessary for tissue manipulation, management of robotic instrumentation, retraction and positioning due to the complexity of the case and hospital policies).   Anesthesia: Gen. endotracheal.  Specimens: Bilateral ovaries, fallopian tubes, pelvic washings  Estimated Blood Loss: <20 mL. Blood Replacement: None  Complications: none  Indication for Procedure:  DICER1 mutation, history of breast cancer, family history of ovarian cancer  Operative Findings: small grossly normal appearing tubes and ovaries (surgically absent uterus), with dense adhesions between ovaries and ovarian fossa bilaterally. 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  Procedure: The patient's taken to the operating room and placed under general endotracheal anesthesia testing difficulty. She is placed in a dorsolithotomy position and cervical acromial pad was placed. The arms were tucked with care taken to pad the olecranon process. And prepped and draped in usual sterile fashion.  A 83mm incision was made in the left upper quadrant palmer's point and a 5 mm Optiview trocar used to enter the abdomen under direct visualization. With entry into the abdomen and then maintenance of 15 mm of mercury the patient was placed in Trendelenburg position. An incision was made in the umbilicus and a 20mm trochar was placed through this site. Two incisions were made lateral to the umbilical incision in the left and right abdomen measuring 70mm. These incisions  were made approximately 10 cm lateral to the umbilical incision. 8 mm robotic trochars were inserted. The robot was docked.  The abdomen was inspected as was the pelvis.  Pelvic washings were obtained. An incision was made on the right pelvic side wall peritoneum parallel to the IP ligament and the retroperitoneal space entered. The right ureter was identified but was invested in retroperitoneal fibrosis and was densely adherent to the ovary. For 15 minutes sharp and blunt meticulous ureterolysis took place to skeletonize the ureter and free it from its surrounding adhesions and the attached ovary. The para-rectal space was developed. A window was created in the right broad ligament above the ureter. The right infundibulopelvic vessels were skeletonized cauterized and transected. The vaginal attachments to the ovary were similarly were cauterized and transected. Specimen was placed in an Endo Catch bag.  In a similar manner the left peritoneum and the side wall was incised, and the retroperitoneal space entered. The left ureter was identified and was also noted to be invested with fibrotic retroperitoneal tissue and was densely adherent to the ovary, and the left pararectal space was developed. The vaginal infundibulopelvic ligament was skeletonized cauterized and transected. The left vaginal attachments to the ovary were cauterized and transected in the left adnexa was placed in an Endo Catch bag.  The abdomen was copiously irrigated and drained and all operative sites inspected and hemostasis was assured  The robot was undocked. The left and right Endo Catch bags were removal from the abdominal cavity via the left upper quadrant incision.  The ports were all remove. The fascial closure at the umbilical incision and left upper quadrant port was made with 0 Vicryl.  All incisions were closed with a running subcuticular Monocryl suture. The incisions were infiltrated with a mixture of exparel and quarter  percent marcaine with  epi. Dermabond was applied. Sponge, lap and needle counts were correct x 3.    The patient had sequential compression devices for VTE prophylaxis.         Disposition: PACU -stable         Condition: stable  Donaciano Eva, MD

## 2017-01-05 NOTE — Anesthesia Postprocedure Evaluation (Signed)
Anesthesia Post Note  Patient: Alexandra Mathis  Procedure(s) Performed: Procedure(s) (LRB): XI ROBOTIC ASSISTED BILATERAL SALPINGO OOPHORECTOMY; BILATERAL URETEROLYSIS (Bilateral) REMOVAL PORT-A-CATH (N/A)     Patient location during evaluation: PACU Anesthesia Type: General Level of consciousness: awake and alert Pain management: pain level controlled Vital Signs Assessment: post-procedure vital signs reviewed and stable Respiratory status: spontaneous breathing, nonlabored ventilation, respiratory function stable and patient connected to nasal cannula oxygen Cardiovascular status: blood pressure returned to baseline and stable Postop Assessment: no signs of nausea or vomiting Anesthetic complications: no    Last Vitals:  Vitals:   01/05/17 0953 01/05/17 1003  BP: 131/89 139/80  Pulse: 73 74  Resp: 14 12  Temp:  36.5 C  SpO2: 97% 100%    Last Pain:  Vitals:   01/05/17 1320  TempSrc:   PainSc: 2                  Breylon Sherrow P Eshaan Titzer

## 2017-01-05 NOTE — Op Note (Signed)
Patient Name:           Alexandra Mathis   Date of Surgery:        01/05/2017  Pre op Diagnosis:      IDC left breast, grade 3, TPBC   Post op Diagnosis:    same  Procedure:                 Remove port a cath  Surgeon:                     [name]. Dalbert Batman, M.D., FACS  Assistant:                      OR staff   Indication for Assistant: N/A  Operative Indications:   . This is a 61 year old female who returns with her husband for another postop visit following definitive breast surgery Dr. Jana Hakim is her medical oncologist. Her primary care provider is Benjamine Mola, [place] in [place].      Initially diagnosed with cancer of the left breast on December 11, 2015 with a 4 cm area of calcifications and a smaller mass central to this. Irrigated biopsy showed grade 3, invasive duct carcinoma and DCIS, a triple positive tumor. A lymph node biopsy was negative. She developed a hematoma which resolved. Further abnormalities were seen but she did not want any further biopsies and wanted a mastectomy. MRI showed a larger area of extensive non-mass enhancement. Port-A-Cath was inserted. Plastic surgery was involved. She underwent Herceptin-based chemotherapy and had good response. Genetic testing was negative.     On April 21, 2016 she underwent bilateral total mastectomy, left axilla sentinel node biopsy, tissue expander insertion. The tumor had been down staged and was only 1.1 cm. Still triple positive. Nodes negative. She has completed one year of herceptin. She is referred for port removal.We have discussed indications, technique and risks of this in detail.      Dr. Denman George plans prophylactic  BSO at same time due to deleterious mutation of DICER1 gene. .    Dr. Jana Hakim has started her on letrozole  Operative Findings:       Port a cath removed intact. No bleeding or infection.  Procedure in Detail:          Following induction of GET anesthesia, a time out was performed and IV  antibiotics given.  The right upper chest was prepped and draped. 0.5% marcaine with epi was used and local anesthesia. A transverse incision was made through the old scar. The capsule was incised and the port elevated and all 3 prolene sutures cut and removed. The port and catheter were removed intact without difficulty. The wound was clean. The subcutaneous tissue was closed with 3-0 vicryl suture and the skin closed with 4-0 subcuticular monocryl and steri strips. EBL <5cc, counts correct, no complications.   Dr. Denman George assumed control of the operatiion at this point and her procedure will be dictated separately.     [name]. Dalbert Batman, M.D., FACS General and Minimally Invasive Surgery Breast and Colorectal Surgery  01/05/2017 8:08 AM

## 2017-01-05 NOTE — Transfer of Care (Signed)
Immediate Anesthesia Transfer of Care Note  Patient: Alexandra Mathis  Procedure(s) Performed: Procedure(s): XI ROBOTIC ASSISTED BILATERAL SALPINGO OOPHORECTOMY; BILATERAL URETEROLYSIS (Bilateral) REMOVAL PORT-A-CATH (N/A)  Patient Location: PACU  Anesthesia Type:General  Level of Consciousness: awake, alert , oriented and patient cooperative  Airway & Oxygen Therapy: Patient Spontanous Breathing and Patient connected to face mask oxygen  Post-op Assessment: Report given to RN, Post -op Vital signs reviewed and stable and Patient moving all extremities  Post vital signs: Reviewed and stable  Last Vitals:  Vitals:   01/05/17 0540  BP: 117/88  Pulse: 65  Resp: 16  Temp: 36.8 C  SpO2: 98%    Last Pain:  Vitals:   01/05/17 0540  TempSrc: Oral      Patients Stated Pain Goal: 3 (90/24/09 7353)  Complications: No apparent anesthesia complications

## 2017-01-05 NOTE — Interval H&P Note (Signed)
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 After consideration of risks, benefits and other options for treatment, the patient has consented to  Procedure(s): XI ROBOTIC ASSISTED BILATERAL SALPINGO OOPHORECTOMY (Bilateral) REMOVAL PORT-A-CATH (N/A) as a surgical intervention .  The patient's history has been reviewed, patient examined, no change in status, stable for surgery.  I have reviewed the patient's chart and labs.  Questions were answered to the patient's satisfaction.     [name]

## 2017-01-05 NOTE — Anesthesia Procedure Notes (Signed)
Procedure Name: Intubation Date/Time: 01/05/2017 7:40 AM Performed by: Carleene Cooper A Pre-anesthesia Checklist: Patient identified, Suction available, Patient being monitored and Emergency Drugs available Patient Re-evaluated:Patient Re-evaluated prior to induction Oxygen Delivery Method: Circle system utilized Preoxygenation: Pre-oxygenation with 100% oxygen Induction Type: IV induction Ventilation: Mask ventilation without difficulty Laryngoscope Size: Mac and 4 Grade View: Grade I Tube type: Oral Tube size: 7.0 mm Number of attempts: 1 Airway Equipment and Method: Stylet Placement Confirmation: ETT inserted through vocal cords under direct vision,  positive ETCO2 and breath sounds checked- equal and bilateral Secured at: 21 cm Tube secured with: Tape Dental Injury: Teeth and Oropharynx as per pre-operative assessment

## 2017-01-06 ENCOUNTER — Encounter (HOSPITAL_COMMUNITY): Payer: Self-pay | Admitting: Gynecologic Oncology

## 2017-01-06 ENCOUNTER — Telehealth: Payer: Self-pay | Admitting: Nurse Practitioner

## 2017-01-06 NOTE — Telephone Encounter (Signed)
This patient calling clinic to ask when she can restart her daily baby asiprin (taking for general heart health). Per Dr. Denman George, can be restarted today. She denies uncontrolled pain or bleeding. She had BM yesterday but not yet today, planning to take a stool softener tonight if no BM. Encouraged to increase water intake and be active as instructed and as tolerated. She also requests to move her post op apt to a later time. Given new apt for 01/29/17 at 3:45 with Dr. Denman George. She is encouraged to call with any new or worsening concerns. She is very appreciative of this clinic and the care she has received, thanks for the above information.

## 2017-01-14 ENCOUNTER — Telehealth: Payer: Self-pay | Admitting: Gynecologic Oncology

## 2017-01-14 NOTE — Telephone Encounter (Signed)
Returned call to patient.  She reports mild abdominal discomfort after carrying a heavy object.  She also notes the discomfort with having bowel movements.  No fever, chills.  She states she was taking a stool softener previous and had stopped and can tell a difference.  Advised to resume once daily stool softeners for one to two weeks.  Reportable signs and symptoms reviewed.  Advised to call for any needs or changes.

## 2017-01-29 ENCOUNTER — Ambulatory Visit: Payer: BC Managed Care – PPO | Attending: Gynecologic Oncology | Admitting: Gynecologic Oncology

## 2017-01-29 ENCOUNTER — Encounter: Payer: Self-pay | Admitting: Gynecologic Oncology

## 2017-01-29 VITALS — BP 112/74 | HR 78 | Temp 98.4°F | Resp 18 | Wt 166.7 lb

## 2017-01-29 DIAGNOSIS — Z853 Personal history of malignant neoplasm of breast: Secondary | ICD-10-CM | POA: Insufficient documentation

## 2017-01-29 DIAGNOSIS — Z803 Family history of malignant neoplasm of breast: Secondary | ICD-10-CM | POA: Insufficient documentation

## 2017-01-29 DIAGNOSIS — E785 Hyperlipidemia, unspecified: Secondary | ICD-10-CM | POA: Diagnosis not present

## 2017-01-29 DIAGNOSIS — Z1509 Genetic susceptibility to other malignant neoplasm: Secondary | ICD-10-CM

## 2017-01-29 DIAGNOSIS — Z148 Genetic carrier of other disease: Secondary | ICD-10-CM | POA: Diagnosis not present

## 2017-01-29 DIAGNOSIS — D649 Anemia, unspecified: Secondary | ICD-10-CM | POA: Diagnosis not present

## 2017-01-29 DIAGNOSIS — Z08 Encounter for follow-up examination after completed treatment for malignant neoplasm: Secondary | ICD-10-CM | POA: Insufficient documentation

## 2017-01-29 DIAGNOSIS — Z8249 Family history of ischemic heart disease and other diseases of the circulatory system: Secondary | ICD-10-CM | POA: Diagnosis not present

## 2017-01-29 DIAGNOSIS — Z1589 Genetic susceptibility to other disease: Secondary | ICD-10-CM

## 2017-01-29 DIAGNOSIS — Z1502 Genetic susceptibility to malignant neoplasm of ovary: Secondary | ICD-10-CM

## 2017-01-29 DIAGNOSIS — Z808 Family history of malignant neoplasm of other organs or systems: Secondary | ICD-10-CM | POA: Insufficient documentation

## 2017-01-29 DIAGNOSIS — Z87891 Personal history of nicotine dependence: Secondary | ICD-10-CM | POA: Insufficient documentation

## 2017-01-29 NOTE — Patient Instructions (Signed)
Follow-up with Dr Denman George is not required. Call her office at [phone] if you have questions or concerns.  Please see Dr Sabra Heck if you have any gynecologic concerns in the future. However, you do not require annual pap smears.

## 2017-01-29 NOTE — Progress Notes (Signed)
Consult Note: Gyn-Onc  Consult was requested by Dr. Jana Hakim and Dr Sabra Heck for the evaluation of Alexandra Mathis 61 y.o. female  CC:  Chief Complaint  Patient presents with  . Monoallelic mutation of DICER1 gene  Deleterious mutation of DICER1  Assessment/Plan:  Ms. Alexandra Mathis  is a 61 y.o.  year old with a deleterious mutation of DICER1 gene, a personal history of breast cancer who is s/p risk reducing BSO. Healing normally postop.  Follow-up on a prn basis  HPI: Alexandra Mathis is a 61 year old P4 who is seen in consultation at the request of Dr Sabra Heck for a history of triple positive breast cancer and a persona history of a mutation of DICER 1 of uncertain significance.  The patient has a remote history of adenomyosis and endometriosis (had multiple laparoscopies). She had 4 cesarean sections. At the time of her 4th she had a cesarean hysterectomy.  She subsequently developed breast cancer at age 23. Her tumor was stage I and ER/PR/Her2Neu positive. She was treated with chemotherapy and surgery (bilateral mastectomies). She is completing Herceptin therapy at present. Her cousin has a strong family history of cancer (breast, colon, uterine). The patient was tested with genetics and found to have a mutation in DICER1 of uncertain significance.  [place] on 03/26/16 showed normal ovaries bilaterally.   Interval Hx: On 01/29/17  she underwent robotic assisted lysis of adhesions, ureterolysis and BSO. Final pathology was benign. No ovarian tissue was identified on the final specimen. It had likely been removed a the time of a prior surgery.  Since surgery she is doing well with no complaints.  Current Meds:  Outpatient Encounter Prescriptions as of 01/29/2017  Medication Sig  . anastrozole (ARIMIDEX) 1 MG tablet TAKE 1 TABLET (1 MG TOTAL) BY MOUTH DAILY. (Patient taking differently: Take 1 mg by mouth at bedtime. )  . Ascorbic Acid (VITAMIN C) 1000 MG tablet Take 1,000 mg by mouth daily.   Marland Kitchen aspirin EC 81 MG tablet Take 81 mg by mouth daily.  Marland Kitchen atorvastatin (LIPITOR) 40 MG tablet Take 40 mg by mouth at bedtime.   . Cetirizine HCl 10 MG CAPS Take 10 mg by mouth daily as needed (allergies).   . Cholecalciferol (VITAMIN D) 2000 units CAPS Take 2,000 Units by mouth daily.  Marland Kitchen CRANBERRY PO Take 1,500 mg by mouth daily.  . Cyanocobalamin (VITAMIN B-12) 5000 MCG TBDP Take 5,000 mcg by mouth daily.  Marland Kitchen etodolac (LODINE) 400 MG tablet Take 400 mg by mouth 2 (two) times daily as needed for mild pain.   Marland Kitchen FIBER COMPLETE PO Take 5 tablets by mouth daily.   Marland Kitchen ibuprofen (ADVIL,MOTRIN) 200 MG tablet Take 400 mg by mouth daily as needed for headache or moderate pain.  . Multiple Vitamin (MULTIVITAMIN) tablet Take 1 tablet by mouth daily.    . nitroGLYCERIN (NITROLINGUAL) 0.4 MG/SPRAY spray Place 1 spray under the tongue every 5 (five) minutes x 3 doses as needed for chest pain.  . Omega-3 Fatty Acids (FISH OIL OMEGA-3 PO) Take 1 tablet by mouth daily.  . Probiotic Product (ALIGN PO) Take 1 tablet by mouth daily.   . sodium chloride (OCEAN) 0.65 % SOLN nasal spray Place 1 spray into both nostrils as needed for congestion.  . [DISCONTINUED] HYDROmorphone (DILAUDID) 2 MG tablet Take 1 tablet (2 mg total) by mouth every 4 (four) hours as needed for severe pain.   Facility-Administered Encounter Medications as of 01/29/2017  Medication  . sodium chloride  0.9 % injection 10 mL    Allergy:  Allergies  Allergen Reactions  . Codeine Itching    Can not tolerate tylenol #3  . Morphine And Related Hives, Nausea And Vomiting and Rash  . Vicodin [Hydrocodone-Acetaminophen] Palpitations    Social Hx:   Social History   Social History  . Marital status: Married    Spouse name: N/A  . Number of children: 4  . Years of education: N/A   Occupational History  . teacher    Social History Main Topics  . Smoking status: Former Smoker    Years: 0.25    Types: Cigarettes    Quit date: 05/25/1974  .  Smokeless tobacco: Never Used     Comment: was only a social/weekend smoker; smoked for a couple months total  . Alcohol use 0.5 oz/week    1 Standard drinks or equivalent per week     Comment: 04/21/2016 "1 glass wine a few times/year"  . Drug use: No     Comment: last used 25 yrs ago  . Sexual activity: Yes    Birth control/ protection: Surgical, Post-menopausal   Other Topics Concern  . Not on file   Social History Narrative  . No narrative on file    Past Surgical Hx:  Past Surgical History:  Procedure Laterality Date  . ABDOMINAL HYSTERECTOMY  1990   done with last cesarean section; "still have my ovaries"  . ANTERIOR CRUCIATE LIGAMENT REPAIR Right   . BREAST BIOPSY Left 2017  . BREAST BIOPSY Right 2017   "MRI guided; benign"  . BREAST RECONSTRUCTION WITH PLACEMENT OF TISSUE EXPANDER AND FLEX HD (ACELLULAR HYDRATED DERMIS) Bilateral 04/21/2016  . BREAST RECONSTRUCTION WITH PLACEMENT OF TISSUE EXPANDER AND FLEX HD (ACELLULAR HYDRATED DERMIS) Bilateral 04/21/2016   Procedure: BREAST RECONSTRUCTION WITH PLACEMENT OF TISSUE EXPANDER AND ALLODERM;  Surgeon: Irene Limbo, MD;  Location: [facility];  Service: Plastics;  Laterality: Bilateral;  . [place]; 1983; 1987; 1990  . CESAREAN SECTION     done with last cesarean section  . CYST EXCISION  2007   on abdomen- 2 cyst removed; "benign"  . DILATION AND CURETTAGE OF UTERUS  ~ 1980; 1989  . GANGLION CYST EXCISION Right ~ 1975  . LAPAROSCOPIC ENDOMETRIOSIS FULGURATION  1989   w/D&C  . MASTECTOMY Bilateral 04/21/2016   LEFT SKIN SPARING MASTECTOMY WITH LEFT SENTINAL LYMPH NODE BIOPSY, RIGHT PROPHYLATIC SKIN SPARING MASTECTOMY   . NIPPLE SPARING MASTECTOMY/SENTINAL LYMPH NODE BIOPSY/RECONSTRUCTION/PLACEMENT OF TISSUE EXPANDER Bilateral 04/21/2016   Procedure: LEFT SKIN SPARING MASTECTOMY WITH LEFT SENTINAL LYMPH NODE BIOPSY, RIGHT PROPHYLATIC SKIN SPARING MASTECTOMY;  Surgeon: Fanny Skates, MD;  Location: [facility];   Service: General;  Laterality: Bilateral;  . PORT-A-CATH REMOVAL N/A 01/05/2017   Procedure: REMOVAL PORT-A-CATH;  Surgeon: Fanny Skates, MD;  Location: [facility];  Service: General;  Laterality: N/A;  . PORTACATH PLACEMENT Right 12/25/2015   Procedure: INSERTION PORT-A-CATH WITH [place];  Surgeon: Fanny Skates, MD;  Location: [facility];  Service: General;  Laterality: Right;  . REMOVAL OF BILATERAL TISSUE EXPANDERS WITH PLACEMENT OF BILATERAL BREAST IMPLANTS Bilateral 07/28/2016   Procedure: REMOVAL OF BILATERAL TISSUE EXPANDERS WITH PLACEMENT OF BILATERAL SILICONE BREAST IMPLANTS;  Surgeon: Irene Limbo, MD;  Location: [facility];  Service: Plastics;  Laterality: Bilateral;  . ROBOTIC ASSISTED BILATERAL SALPINGO OOPHERECTOMY Bilateral 01/05/2017   Procedure: XI ROBOTIC ASSISTED BILATERAL SALPINGO OOPHORECTOMY; BILATERAL URETEROLYSIS;  Surgeon: Everitt Amber, MD;  Location: [facility];  Service: Gynecology;  Laterality: Bilateral;  . TONSILLECTOMY    . WISDOM TOOTH EXTRACTION  1978    Past Medical Hx:  Past Medical History:  Diagnosis Date  . Anemia   . Arthritis    "mostly in my hands; alot in my knees" (04/21/2016)  . Breast cancer of upper-outer quadrant of left female breast ([place]) 12/05/2015  . Chemotherapy management, encounter for 12/27/15 started  . Chest pain   . Family history of premature CAD   . Fibroid   . HLD (hyperlipidemia)   . Neuropathy    feet  . Pneumonia 1996; 2006  . PONV (postoperative nausea and vomiting)    scop patch works well    Past Gynecological History: c/s x 4  S/p hysterectomy. No LMP recorded. Patient has had a hysterectomy.  Family Hx:  Family History  Problem Relation Age of Onset  . Other Mother        benign meningioma dx 72 s/p surgery; hx of hysterectomy for hemorrhaging  . Hypothyroidism Mother   . CAD Mother   . Colon cancer Father 12       w/ mets to lungs and liver; + chemo; d. 60  . Gout Father   . Arthritis Father   . Ovarian  cancer Sister        early 91s; "precancerous cells" s/p USO  . Breast cancer Sister 16       s/p mastectomy and aromatase inhibitor  . Other Paternal Aunt 46       Primary peritoneal cancer; s/p surgery  . Cancer Paternal Uncle   . Ovarian cancer Sister 54       "pre-cancer"; s/p BSO  . Cervical cancer Sister   . Basal cell carcinoma Sister   . Colon polyps Sister        unspecified number  . Breast cancer Cousin 27       s/p mastectomy  . Ovarian cancer Cousin 50       s/p BSO  . Leukemia Cousin 73  . Cancer Cousin        dx carcinoid bowel tumor  . Other Cousin        reportedly negative "BRCA1/2 testing"  . Basal cell carcinoma Cousin   . Breast cancer Cousin 89       reportedly negative GT is 2014-2015  . CAD Maternal Grandmother        d. 90s  . Heart attack Maternal Grandfather 51  . Heart Problems Paternal Grandmother   . Heart Problems Paternal Grandfather   . CAD Brother        triple vessel CAD, d. 63  . Other Son        enlarged prostate w/ surveillance  . Thyroid cancer Maternal Uncle 71       unspecified type  . CAD Maternal Uncle        d. 36  . Other Cousin 48       dx benign "skin polyp"  . Breast cancer Other        paternal great aunt (PGM's sister)  . Breast cancer Other        paternal great aunt (PGM's sister)    Review of Systems:  Constitutional  Feels well,    ENT Normal appearing ears and nares bilaterally Skin/Breast  No rash, sores, jaundice, itching, dryness Cardiovascular  No chest pain, shortness of breath, or edema  Pulmonary  No cough or wheeze.  Gastro Intestinal  No nausea, vomitting, or diarrhoea. No bright  red blood per rectum, no abdominal pain, change in bowel movement, or constipation.  Genito Urinary  No frequency, urgency, dysuria,  Musculo Skeletal  No myalgia, arthralgia, joint swelling or pain  Neurologic  No weakness, numbness, change in gait,  Psychology  No depression, anxiety, insomnia.   Vitals:   Blood pressure 112/74, pulse 78, temperature 98.4 F (36.9 C), temperature source Oral, resp. rate 18, weight 166 lb 11.2 oz (75.6 kg), SpO2 100 %.  Physical Exam: WD in NAD Neck  Supple NROM, without any enlargements.  Lymph Node Survey No cervical supraclavicular or inguinal adenopathy Cardiovascular  Pulse normal rate, regularity and rhythm. S1 and S2 normal.  Lungs  Clear to auscultation bilateraly, without wheezes/crackles/rhonchi. Good air movement.  Skin  No rash/lesions/breakdown  Psychiatry  Alert and oriented to person, place, and time  Abdomen  Normoactive bowel sounds, abdomen soft, non-tender and nonobese without evidence of hernia. Incisions healed.  Back No CVA tenderness Genito Urinary  Vulva/vagina: Normal external female genitalia.  No lesions. No discharge or bleeding.  Bladder/urethra:  No lesions or masses, well supported bladder  Vagina: normal  Cervix and uterus: surgically absent  Adnexa: no palpable masses. Rectal  deferred Extremities  No bilateral cyanosis, clubbing or edema.   Donaciano Eva, MD  01/29/2017, 5:02 PM

## 2017-02-01 ENCOUNTER — Ambulatory Visit: Payer: BC Managed Care – PPO | Admitting: Gynecologic Oncology

## 2017-02-08 ENCOUNTER — Other Ambulatory Visit: Payer: BC Managed Care – PPO

## 2017-02-10 ENCOUNTER — Ambulatory Visit (INDEPENDENT_AMBULATORY_CARE_PROVIDER_SITE_OTHER)
Admission: RE | Admit: 2017-02-10 | Discharge: 2017-02-10 | Disposition: A | Discharge status: Home / Self Care | Payer: Self-pay | Source: Ambulatory Visit | Attending: Internal Medicine | Admitting: Internal Medicine

## 2017-02-10 DIAGNOSIS — Z8249 Family history of ischemic heart disease and other diseases of the circulatory system: Secondary | ICD-10-CM

## 2017-03-11 ENCOUNTER — Encounter: Payer: BC Managed Care – PPO | Admitting: Adult Health

## 2017-03-30 ENCOUNTER — Telehealth: Payer: Self-pay

## 2017-03-30 NOTE — Telephone Encounter (Signed)
Patient called to reschedule her appointment time. Val the RN approved 400pm appointment time on 11/21. Per 11/6 phone que.

## 2017-04-07 ENCOUNTER — Other Ambulatory Visit: Payer: Self-pay

## 2017-04-07 MED ORDER — ANASTROZOLE 1 MG PO TABS: 1.0000 mg | ORAL_TABLET | Freq: Every day | ORAL | 3 refills | Status: DC

## 2017-04-08 NOTE — Progress Notes (Signed)
[place]  Telephone:(336) [phone] Fax:(336) [phone]     ID: LETA BUCKLIN DOB: [birth year]  MR#: [id number]  BTD#:[id number]  Patient Care Team: Darrol Jump, PA-C as PCP - General (Family Medicine) Fanny Skates, MD as Consulting Physician (General Surgery) Siera Beyersdorf, Virgie Dad, MD as Consulting Physician (Oncology) Kyung Rudd, MD as Consulting Physician (Radiation Oncology) Larey Dresser, MD as Consulting Physician (Cardiology) Irene Limbo, MD as Consulting Physician (Plastic Surgery) Megan Salon, MD as Consulting Physician (Gynecology) Everitt Amber, MD as Consulting Physician (Obstetrics and Gynecology) OTHER MD:  CHIEF COMPLAINT: Triple positive breast cancer  CURRENT TREATMENT: anastrozole   BREAST CANCER HISTORY: From the original intake note:  "Lesleigh Noe" had screening mammography in June 2017 showing left breast upper outer quadrant calcifications. Accordingly she was referred for left diagnostic mammography and ultrasonography, performed 11/27/2015. The breast density was category C. Compression views of the left breast found pleomorphic calcifications in the upper outer quadrant measuring up to 4.0 cm. There was an area of associated architectural distortion in the upper outer quadrant as well. On physical exam there was an irregular thickening in the left breast at the 2:00 position anteriorly. Ultrasonography confirmed a hypoechoic irregular mass measuring 1.6 cm in the 2:00 position 2 cm from the nipple. An intramammary lymph node was noted with benign appearing. In the left axilla there was a lymph node with focally thickened cortex.  Biopsy of the left breast mass and left axillary lymph node 12/02/2015 found ([id number]), in the breast, invasive ductal carcinoma, grade 3, estrogen receptor 95% positive, progesterone receptor 15% positive, both with strong staining intensity, with an MIB-1 of 15%, and HER-2 amplification, the signals ratio  being 2.54 and the number per cell 6.10. Biopsy of the left axillary lymph node was negative.  Her subsequent history is as detailed below  INTERVAL HISTORY: Lesleigh Noe returns today for further follow-up and treatment of her estrogen receptor positive breast cancer.  She continues on anastrozole, which she tolerates well. She reports rare hot flashes. She denies any changes to her vaginal dryness.   Since the last visit here she underwent bilateral salpingo-oophorectomy under Dr. Denman George on 01/05/2017.  The final pathology ([id number]) was benign. She tolerated surgery well.  REVIEW OF SYSTEMS: Lesleigh Noe is doing well and reports that she has been busy working and plans to retire next year. Two of her children are living in [place] and she would like to move closer to them and her grandchildren. She notes that she will exercise 5 days a week for at least 20-30 minutes. She denies unusual headaches, visual changes, nausea, vomiting, or dizziness. There has been no unusual cough, phlegm production, or pleurisy. This been no change in bowel or bladder habits. She denies unexplained fatigue or unexplained weight loss, bleeding, rash, or fever. A detailed review of systems was otherwise entirely stable.   PAST MEDICAL HISTORY: Past Medical History:  Diagnosis Date  . Anemia   . Arthritis    "mostly in my hands; alot in my knees" (04/21/2016)  . Breast cancer of upper-outer quadrant of left female breast ([place]) 12/05/2015  . Chemotherapy management, encounter for 12/27/15 started  . Chest pain   . Family history of premature CAD   . Fibroid   . HLD (hyperlipidemia)   . Neuropathy    feet  . Pneumonia 1996; 2006  . PONV (postoperative nausea and vomiting)    scop patch works well    PAST SURGICAL HISTORY: Past Surgical History:  Procedure  Laterality Date  . ABDOMINAL HYSTERECTOMY  1990   done with last cesarean section; "still have my ovaries"  . ANTERIOR CRUCIATE LIGAMENT REPAIR Right   . BREAST  BIOPSY Left 2017  . BREAST BIOPSY Right 2017   "MRI guided; benign"  . BREAST RECONSTRUCTION WITH PLACEMENT OF TISSUE EXPANDER AND FLEX HD (ACELLULAR HYDRATED DERMIS) Bilateral 04/21/2016  . BREAST RECONSTRUCTION WITH PLACEMENT OF TISSUE EXPANDER AND FLEX HD (ACELLULAR HYDRATED DERMIS) Bilateral 04/21/2016   Procedure: BREAST RECONSTRUCTION WITH PLACEMENT OF TISSUE EXPANDER AND ALLODERM;  Surgeon: Irene Limbo, MD;  Location: [facility];  Service: Plastics;  Laterality: Bilateral;  . [place]; 1983; 1987; 1990  . CESAREAN SECTION     done with last cesarean section  . CYST EXCISION  2007   on abdomen- 2 cyst removed; "benign"  . DILATION AND CURETTAGE OF UTERUS  ~ 1980; 1989  . GANGLION CYST EXCISION Right ~ 1975  . LAPAROSCOPIC ENDOMETRIOSIS FULGURATION  1989   w/D&C  . MASTECTOMY Bilateral 04/21/2016   LEFT SKIN SPARING MASTECTOMY WITH LEFT SENTINAL LYMPH NODE BIOPSY, RIGHT PROPHYLATIC SKIN SPARING MASTECTOMY   . NIPPLE SPARING MASTECTOMY/SENTINAL LYMPH NODE BIOPSY/RECONSTRUCTION/PLACEMENT OF TISSUE EXPANDER Bilateral 04/21/2016   Procedure: LEFT SKIN SPARING MASTECTOMY WITH LEFT SENTINAL LYMPH NODE BIOPSY, RIGHT PROPHYLATIC SKIN SPARING MASTECTOMY;  Surgeon: Fanny Skates, MD;  Location: [facility];  Service: General;  Laterality: Bilateral;  . PORT-A-CATH REMOVAL N/A 01/05/2017   Procedure: REMOVAL PORT-A-CATH;  Surgeon: Fanny Skates, MD;  Location: [facility];  Service: General;  Laterality: N/A;  . PORTACATH PLACEMENT Right 12/25/2015   Procedure: INSERTION PORT-A-CATH WITH [place];  Surgeon: Fanny Skates, MD;  Location: [facility];  Service: General;  Laterality: Right;  . REMOVAL OF BILATERAL TISSUE EXPANDERS WITH PLACEMENT OF BILATERAL BREAST IMPLANTS Bilateral 07/28/2016   Procedure: REMOVAL OF BILATERAL TISSUE EXPANDERS WITH PLACEMENT OF BILATERAL SILICONE BREAST IMPLANTS;  Surgeon: Irene Limbo, MD;  Location: [facility];  Service: Plastics;  Laterality: Bilateral;    . ROBOTIC ASSISTED BILATERAL SALPINGO OOPHERECTOMY Bilateral 01/05/2017   Procedure: XI ROBOTIC ASSISTED BILATERAL SALPINGO OOPHORECTOMY; BILATERAL URETEROLYSIS;  Surgeon: Everitt Amber, MD;  Location: [facility];  Service: Gynecology;  Laterality: Bilateral;  . TONSILLECTOMY    . WISDOM TOOTH EXTRACTION  1978    FAMILY HISTORY Family History  Problem Relation Age of Onset  . Other Mother        benign meningioma dx 58 s/p surgery; hx of hysterectomy for hemorrhaging  . Hypothyroidism Mother   . CAD Mother   . Colon cancer Father 32       w/ mets to lungs and liver; + chemo; d. 56  . Gout Father   . Arthritis Father   . Ovarian cancer Sister        early 30s; "precancerous cells" s/p USO  . Breast cancer Sister 63       s/p mastectomy and aromatase inhibitor  . Other Paternal Aunt 71       Primary peritoneal cancer; s/p surgery  . Cancer Paternal Uncle   . Ovarian cancer Sister 38       "pre-cancer"; s/p BSO  . Cervical cancer Sister   . Basal cell carcinoma Sister   . Colon polyps Sister        unspecified number  . Breast cancer Cousin 17       s/p mastectomy  . Ovarian cancer Cousin 57       s/p BSO  . Leukemia Cousin 89  .  Cancer Cousin        dx carcinoid bowel tumor  . Other Cousin        reportedly negative "BRCA1/2 testing"  . Basal cell carcinoma Cousin   . Breast cancer Cousin 65       reportedly negative GT is 2014-2015  . CAD Maternal Grandmother        d. 90s  . Heart attack Maternal Grandfather 51  . Heart Problems Paternal Grandmother   . Heart Problems Paternal Grandfather   . CAD Brother        triple vessel CAD, d. 12  . Other Son        enlarged prostate w/ surveillance  . Thyroid cancer Maternal Uncle 71       unspecified type  . CAD Maternal Uncle        d. 37  . Other Cousin 48       dx benign "skin polyp"  . Breast cancer Other        paternal great aunt (PGM's sister)  . Breast cancer Other        paternal great aunt (PGM's sister)     GYNECOLOGIC HISTORY:  No LMP recorded. Patient has had a hysterectomy. Menarche age 60, first live birth age 6. The patient is GX P4. She stopped having periods in 1989. She did not use hormone replacement. She used oral contraceptives less than one year.  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 Her husband, [name] is retired from a [employer]. Son, [name], lives in [place], [place] and works as a Chief Strategy Officer. Son, [name], lives in [place], [place] and is with the [employer]. Son, [name], lives in [place] where he works as an Chief Financial Officer. Son, [name], lives in [place], were he works as an Chief Financial Officer. The patient has 4 grandchildren including a 36-year-old they have custody of and lives with them. The patient attends our [employer].    ADVANCED DIRECTIVES: In place   HEALTH MAINTENANCE: Social History   Tobacco Use  . Smoking status: Former Smoker    Years: 0.25    Types: Cigarettes    Last attempt to quit: 05/25/1974    Years since quitting: 42.9  . Smokeless tobacco: Never Used  . Tobacco comment: was only a social/weekend smoker; smoked for a couple months total  Substance Use Topics  . Alcohol use: Yes    Alcohol/week: 0.5 oz    Types: 1 Standard drinks or equivalent per week    Comment: 04/21/2016 "1 glass wine a few times/year"  . Drug use: No    Comment: last used 25 yrs ago     Colonoscopy:  PAP:  Bone density:   Allergies  Allergen Reactions  . Codeine Itching    Can not tolerate tylenol #3  . Morphine And Related Hives, Nausea And Vomiting and Rash  . Vicodin [Hydrocodone-Acetaminophen] Palpitations    Current Outpatient Medications  Medication Sig Dispense Refill  . anastrozole (ARIMIDEX) 1 MG tablet Take 1 tablet (1 mg total) at bedtime by mouth. 90 tablet 3  . Ascorbic Acid (VITAMIN C) 1000 MG tablet Take 1,000 mg by mouth daily.    Marland Kitchen aspirin EC 81 MG tablet Take 81 mg by mouth daily.    Marland Kitchen  atorvastatin (LIPITOR) 40 MG tablet Take 40 mg by mouth at bedtime.   5  . Cetirizine HCl 10 MG CAPS Take 10 mg by mouth daily as needed (allergies).     Marland Kitchen  Cholecalciferol (VITAMIN D) 2000 units CAPS Take 2,000 Units by mouth daily.    Marland Kitchen CRANBERRY PO Take 1,500 mg by mouth daily.    . Cyanocobalamin (VITAMIN B-12) 5000 MCG TBDP Take 5,000 mcg by mouth daily.    Marland Kitchen etodolac (LODINE) 400 MG tablet Take 400 mg by mouth 2 (two) times daily as needed for mild pain.   2  . FIBER COMPLETE PO Take 5 tablets by mouth daily.     Marland Kitchen ibuprofen (ADVIL,MOTRIN) 200 MG tablet Take 400 mg by mouth daily as needed for headache or moderate pain.    . Multiple Vitamin (MULTIVITAMIN) tablet Take 1 tablet by mouth daily.      . nitroGLYCERIN (NITROLINGUAL) 0.4 MG/SPRAY spray Place 1 spray under the tongue every 5 (five) minutes x 3 doses as needed for chest pain.    . Omega-3 Fatty Acids (FISH OIL OMEGA-3 PO) Take 1 tablet by mouth daily.    . Probiotic Product (ALIGN PO) Take 1 tablet by mouth daily.     . sodium chloride (OCEAN) 0.65 % SOLN nasal spray Place 1 spray into both nostrils as needed for congestion.     No current facility-administered medications for this visit.    Facility-Administered Medications Ordered in Other Visits  Medication Dose Route Frequency Provider Last Rate Last Dose  . sodium chloride 0.9 % injection 10 mL  10 mL Intravenous PRN Magrinat, Virgie Dad, MD   10 mL at 06/15/16 0846    OBJECTIVE: Middle-aged white woman who appears well  Vitals:   04/14/17 1538  BP: 116/73  Pulse: 84  Resp: 18  Temp: 98.8 F (37.1 C)  SpO2: 99%     Body mass index is 26.28 kg/m.    ECOG FS:0 - Asymptomatic  Sclerae unicteric, pupils round and equal Oropharynx clear and moist No cervical or supraclavicular adenopathy Lungs no rales or rhonchi Heart regular rate and rhythm Abd soft, nontender, positive bowel sounds MSK no focal spinal tenderness, no upper extremity lymphedema Neuro: nonfocal,  well oriented, appropriate affect Breasts: Status post bilateral mastectomies with bilateral implant reconstruction.  There is no evidence of local recurrence.  Both axillae are benign.  LAB RESULTS:  CMP     Component Value Date/Time   NA 143 01/01/2017 1424   NA 140 12/18/2016 0812   K 4.8 01/01/2017 1424   K 4.1 12/18/2016 0812   CL 106 01/01/2017 1424   CO2 31 01/01/2017 1424   CO2 28 12/18/2016 0812   GLUCOSE 101 (H) 01/01/2017 1424   GLUCOSE 84 12/18/2016 0812   BUN 18 01/01/2017 1424   BUN 18.8 12/18/2016 0812   CREATININE 0.68 01/01/2017 1424   CREATININE 0.8 12/18/2016 0812   CALCIUM 9.3 01/01/2017 1424   CALCIUM 9.6 12/18/2016 0812   PROT 7.2 01/01/2017 1424   PROT 7.4 12/18/2016 0812   ALBUMIN 4.3 01/01/2017 1424   ALBUMIN 4.0 12/18/2016 0812   AST 25 01/01/2017 1424   AST 27 12/18/2016 0812   ALT 20 01/01/2017 1424   ALT 21 12/18/2016 0812   ALKPHOS 62 01/01/2017 1424   ALKPHOS 70 12/18/2016 0812   BILITOT 0.7 01/01/2017 1424   BILITOT 0.51 12/18/2016 0812   GFRNONAA >60 01/01/2017 1424   GFRAA >60 01/01/2017 1424    INo results found for: SPEP, UPEP  Lab Results  Component Value Date   WBC 5.0 04/14/2017   NEUTROABS 2.5 04/14/2017   HGB 12.7 04/14/2017   HCT 38.0 04/14/2017   MCV 87.6  04/14/2017   PLT 186 04/14/2017      Chemistry      Component Value Date/Time   NA 143 01/01/2017 1424   NA 140 12/18/2016 0812   K 4.8 01/01/2017 1424   K 4.1 12/18/2016 0812   CL 106 01/01/2017 1424   CO2 31 01/01/2017 1424   CO2 28 12/18/2016 0812   BUN 18 01/01/2017 1424   BUN 18.8 12/18/2016 0812   CREATININE 0.68 01/01/2017 1424   CREATININE 0.8 12/18/2016 0812      Component Value Date/Time   CALCIUM 9.3 01/01/2017 1424   CALCIUM 9.6 12/18/2016 0812   ALKPHOS 62 01/01/2017 1424   ALKPHOS 70 12/18/2016 0812   AST 25 01/01/2017 1424   AST 27 12/18/2016 0812   ALT 20 01/01/2017 1424   ALT 21 12/18/2016 0812   BILITOT 0.7 01/01/2017 1424    BILITOT 0.51 12/18/2016 0812       No results found for: LABCA2  No components found for: LABCA125  No results for input(s): INR in the last 168 hours.  Urinalysis    Component Value Date/Time   COLORURINE YELLOW 01/01/2017 1315   APPEARANCEUR CLEAR 01/01/2017 1315   LABSPEC 1.018 01/01/2017 1315   PHURINE 5.0 01/01/2017 1315   GLUCOSEU NEGATIVE 01/01/2017 1315   HGBUR NEGATIVE 01/01/2017 1315   BILIRUBINUR NEGATIVE 01/01/2017 1315   KETONESUR NEGATIVE 01/01/2017 1315   PROTEINUR NEGATIVE 01/01/2017 1315   NITRITE NEGATIVE 01/01/2017 1315   LEUKOCYTESUR TRACE (A) 01/01/2017 1315     STUDIES: Bone density 08/05/2016 was normal with a T score of -0.9  ELIGIBLE FOR AVAILABLE RESEARCH PROTOCOL: no  ASSESSMENT: 61 y.o. [place], [place] woman status post left breast upper outer quadrant biopsy 12/02/2015 for a clinical T1c pN0, stage IA  invasive ductal carcinoma, grade 3, estrogen and progesterone receptor positive, with an MIB-1 of 15%, and HER-2 amplification  (a) biopsy of a 0.6 cm lower outer quadrant right breast mass 12/26/2015  (1) neoadjuvant chemotherapy consisting of weekly paclitaxel 12 with trastuzumab and pertuzumab every 21 days 4 started 12/27/2015  (a) paclitaxel discontinued after 7 doses because of peripheral neuropathy  (b) received carboplatin/gemcitabine weekly 5 beginning 02/21/2016, last dose 03/27/2016  (c) cycle 3 of carboplatin/ delayed one week because of thrombocytopenia; carbo dose reduced 10%  (d) cycle 5 carboplatin dose reduced an additional 10% and gemcitabine reduced to 800 mg/m due to thrombocytopenia  (2) trastuzumab continued to total of one year (last dose 12/18/2016)  (a) echo showed on 07/13/2016 ejection fraction in the 55 % range.  (b) echocardiogram 08/28/2016 showed an ejection fraction in the 55-60% range  (c) echo 12/02/2016 shows EF 55-60%  (3) underwent bilateral skin sparing mastectomies with left sentinel lymph node  sampling and immediate implant reconstruction 04/21/2016, showing  (a) on the right, no evidence of malignancy  (b) on the left, a residual pT1c pN0, stage IA  invasive ductal carcinoma, grade 2  (c) repeat prognostic panel confirms triple positive disease   (d) negative margins  (4) postmastectomy radiation not indicated   (5) anastrozole started 05/01/2016  (a) baseline bone density 08/05/2016 shows a T score of -0.9 (normal).  (6) genetics testing 02/09/2016 through the 44-gene [facility] Custom Panel performed by [facility], [place]) found no deleterious mutations in APC, ATM, AXIN2, BARD1, BMPR1A, BRCA1, BRCA2, BRIP1, CDH1, CDKN2A, CHEK2, DICER1, EPCAM, GREM1, KIT, MEN1, MLH1, MSH2, MSH6, MUTYH, NBN, NF1, NF2, PALB2, PDGFRA, PMS2, POLD1, POLE, PTEN, RAD50, RAD51C, RAD51D, SDHA, SDHB, SDHC,  SDHD, SMAD4, SMARCA4, SMARE1, STK11, TP53, TSC1, TSC2, and VHL  (a)   One variant of uncertain significance (VUS) was found in "c.1691C>G (p.Ala564Gly)" in one copy of the DICER1 gene  (7) status post bilateral salpingo-oophorectomy 01/05/2017 with benign pathology   PLAN: Lesleigh Noe is now a year out from definitive surgery for her breast cancer with no evidence of disease recurrence.  This is very favorable.  She is tolerating anastrozole remarkably well and the plan will be to continue that for a total of 5 years.  She will be due for her next bone density March 2020.  In the meantime she has an excellent exercise program going.  She knows to call for any other issues that may develop before the next visit here.   Magrinat, Virgie Dad, MD  04/14/17 4:01 PM Medical Oncology and Hematology [facility] [address] [place], [place] [address] Tel. [phone]    Fax. [phone]  This document serves as a record of services personally performed by Chauncey Cruel, MD. It was created on his behalf by Margit Banda, a trained medical scribe. The creation of  this record is based on the scribe's personal observations and the provider's statements to them.   I have reviewed the above documentation for accuracy and completeness, and I agree with the above.

## 2017-04-14 ENCOUNTER — Other Ambulatory Visit: Payer: Self-pay | Admitting: *Deleted

## 2017-04-14 ENCOUNTER — Telehealth: Payer: Self-pay | Admitting: Oncology

## 2017-04-14 ENCOUNTER — Ambulatory Visit: Payer: BC Managed Care – PPO | Admitting: Oncology

## 2017-04-14 ENCOUNTER — Other Ambulatory Visit: Payer: BC Managed Care – PPO

## 2017-04-14 ENCOUNTER — Other Ambulatory Visit (HOSPITAL_BASED_OUTPATIENT_CLINIC_OR_DEPARTMENT_OTHER): Payer: BC Managed Care – PPO

## 2017-04-14 ENCOUNTER — Ambulatory Visit (HOSPITAL_BASED_OUTPATIENT_CLINIC_OR_DEPARTMENT_OTHER): Payer: BC Managed Care – PPO | Admitting: Oncology

## 2017-04-14 VITALS — BP 116/73 | HR 84 | Temp 98.8°F | Resp 18 | Ht 67.0 in | Wt 167.8 lb

## 2017-04-14 DIAGNOSIS — C773 Secondary and unspecified malignant neoplasm of axilla and upper limb lymph nodes: Secondary | ICD-10-CM

## 2017-04-14 DIAGNOSIS — Z170 Estrogen receptor positive status [ER+]: Secondary | ICD-10-CM

## 2017-04-14 DIAGNOSIS — C50412 Malignant neoplasm of upper-outer quadrant of left female breast: Secondary | ICD-10-CM

## 2017-04-14 DIAGNOSIS — Z79811 Long term (current) use of aromatase inhibitors: Secondary | ICD-10-CM

## 2017-04-14 LAB — CBC WITH DIFFERENTIAL/PLATELET
BASO%: 0.5 % (ref 0.0–2.0)
BASOS ABS: 0 10*3/uL (ref 0.0–0.1)
EOS%: 1.5 % (ref 0.0–7.0)
Eosinophils Absolute: 0.1 10*3/uL (ref 0.0–0.5)
HEMATOCRIT: 38 % (ref 34.8–46.6)
HGB: 12.7 g/dL (ref 11.6–15.9)
LYMPH#: 1.9 10*3/uL (ref 0.9–3.3)
LYMPH%: 37.9 % (ref 14.0–49.7)
MCH: 29.4 pg (ref 25.1–34.0)
MCHC: 33.5 g/dL (ref 31.5–36.0)
MCV: 87.6 fL (ref 79.5–101.0)
MONO#: 0.5 10*3/uL (ref 0.1–0.9)
MONO%: 9.8 % (ref 0.0–14.0)
NEUT#: 2.5 10*3/uL (ref 1.5–6.5)
NEUT%: 50.3 % (ref 38.4–76.8)
PLATELETS: 186 10*3/uL (ref 145–400)
RBC: 4.34 10*6/uL (ref 3.70–5.45)
RDW: 13.4 % (ref 11.2–14.5)
WBC: 5 10*3/uL (ref 3.9–10.3)

## 2017-04-14 LAB — COMPREHENSIVE METABOLIC PANEL
ALK PHOS: 75 U/L (ref 40–150)
ALT: 19 U/L (ref 0–55)
ANION GAP: 8 meq/L (ref 3–11)
AST: 24 U/L (ref 5–34)
Albumin: 4.1 g/dL (ref 3.5–5.0)
BILIRUBIN TOTAL: 0.26 mg/dL (ref 0.20–1.20)
BUN: 25 mg/dL (ref 7.0–26.0)
CO2: 28 meq/L (ref 22–29)
Calcium: 9.5 mg/dL (ref 8.4–10.4)
Chloride: 103 mEq/L (ref 98–109)
Creatinine: 0.9 mg/dL (ref 0.6–1.1)
EGFR: >60 mL/min/{1.73_m2} (ref >60)
GLUCOSE: 87 mg/dL (ref 70–140)
POTASSIUM: 4.1 meq/L (ref 3.5–5.1)
SODIUM: 139 meq/L (ref 136–145)
TOTAL PROTEIN: 7.5 g/dL (ref 6.4–8.3)

## 2017-04-14 MED ORDER — ANASTROZOLE 1 MG PO TABS: 1.0000 mg | ORAL_TABLET | Freq: Every day | ORAL | 3 refills | Status: DC

## 2017-04-14 NOTE — Telephone Encounter (Signed)
Scheduled appt per 11/21 sch msg. Patient did not want avs or calendar.

## 2017-08-05 ENCOUNTER — Telehealth: Payer: Self-pay | Admitting: Internal Medicine

## 2017-08-10 ENCOUNTER — Encounter: Payer: Self-pay | Admitting: Internal Medicine

## 2017-08-10 NOTE — Telephone Encounter (Signed)
Dr.Perry reviewed records and accepted for patient to be schedule for a direct colonoscopy. Left message for patient to call back and schedule an appointment.

## 2017-09-17 NOTE — Telephone Encounter (Signed)
Error opening  

## 2017-09-27 ENCOUNTER — Telehealth: Payer: Self-pay | Admitting: *Deleted

## 2017-09-27 NOTE — Telephone Encounter (Signed)
This RN returned VM from pt stating " difficulty getting my follow appointment rescheduled - I will not be here in July and need to reschedule but am having a problem getting to scheduling "  " I need after 4 pm " " but [place] .Marland KitchenMarland KitchenMarland KitchenJune 10 th "  " I am just frustrated"  Return call number given as [phone].  Obtained identified VM for pt - this RN left message - requesting a call back with more specific dates to assist with rescheduling follow up due to dates and request not clear - validation given per her frustration.

## 2017-10-04 ENCOUNTER — Encounter: Payer: BC Managed Care – PPO | Admitting: Internal Medicine

## 2017-10-19 NOTE — Telephone Encounter (Signed)
Patient cancelled appointments. Records will be in "records reviewed" folder. °

## 2017-10-26 NOTE — Progress Notes (Signed)
[place]  [phone] Fax:(336) [phone]     ID: Alexandra Mathis DOB: Aug 14, 1955  MR#: [id number]  XMD#:[id number]  Patient Care Team: Darrol Jump, PA-C as PCP - General (Family Medicine) Fanny Skates, MD as Consulting Physician (General Surgery) , Virgie Dad, MD as Consulting Physician (Oncology) Kyung Rudd, MD as Consulting Physician (Radiation Oncology) Larey Dresser, MD as Consulting Physician (Cardiology) Irene Limbo, MD as Consulting Physician (Plastic Surgery) Megan Salon, MD as Consulting Physician (Gynecology) Everitt Amber, MD as Consulting Physician (Obstetrics and Gynecology) OTHER MD:  CHIEF COMPLAINT: Triple positive breast cancer  CURRENT TREATMENT: anastrozole   BREAST CANCER HISTORY: From the original intake note:  "Alexandra Mathis" had screening mammography in June 2017 showing left breast upper outer quadrant calcifications. Accordingly she was referred for left diagnostic mammography and ultrasonography, performed 11/27/2015. The breast density was category C. Compression views of the left breast found pleomorphic calcifications in the upper outer quadrant measuring up to 4.0 cm. There was an area of associated architectural distortion in the upper outer quadrant as well. On physical exam there was an irregular thickening in the left breast at the 2:00 position anteriorly. Ultrasonography confirmed a hypoechoic irregular mass measuring 1.6 cm in the 2:00 position 2 cm from the nipple. An intramammary lymph node was noted with benign appearing. In the left axilla there was a lymph node with focally thickened cortex.  Biopsy of the left breast mass and left axillary lymph node 12/02/2015 found ([id number]), in the breast, invasive ductal carcinoma, grade 3, estrogen receptor 95% positive, progesterone receptor 15% positive, both with strong staining intensity, with an MIB-1 of 15%, and HER-2 amplification, the signals ratio  being 2.54 and the number per cell 6.10. Biopsy of the left axillary lymph node was negative.  Her subsequent history is as detailed below  INTERVAL HISTORY: Alexandra Mathis returns today for further follow-up and treatment of her triple positive breast cancer accompanied by her husband and granddaughter, Ava.  She continues on anastrozole, with good tolerance. She has occasional hot flashes that aren't intense. There has been no change in her symptoms of vaginal dryness.   REVIEW OF SYSTEMS: Alexandra Mathis reports that she is retiring from being a Education officer, museum. She and her family are moving to [place], [place] next week. Two of her sons are live there. She has already bought a house in [facility] and sold her house in [place]. She denies unusual headaches, visual changes, nausea, vomiting, or dizziness. There has been no unusual cough, phlegm production, or pleurisy. This been no change in bowel or bladder habits. She denies unexplained fatigue or unexplained weight loss, bleeding, rash, or fever. A detailed review of systems was otherwise stable.    PAST MEDICAL HISTORY: Past Medical History:  Diagnosis Date  . Anemia   . Arthritis    "mostly in my hands; alot in my knees" (04/21/2016)  . Breast cancer of upper-outer quadrant of left female breast ([place]) 12/05/2015  . Chemotherapy management, encounter for 12/27/15 started  . Chest pain   . Family history of premature CAD   . Fibroid   . HLD (hyperlipidemia)   . Neuropathy    feet  . Pneumonia 1996; 2006  . PONV (postoperative nausea and vomiting)    scop patch works well    PAST SURGICAL HISTORY: Past Surgical History:  Procedure Laterality Date  . ABDOMINAL HYSTERECTOMY  1990   done with last cesarean section; "still have my ovaries"  . ANTERIOR CRUCIATE LIGAMENT REPAIR Right   .  BREAST BIOPSY Left 2017  . BREAST BIOPSY Right 2017   "MRI guided; benign"  . BREAST RECONSTRUCTION WITH PLACEMENT OF TISSUE EXPANDER AND FLEX HD (ACELLULAR HYDRATED  DERMIS) Bilateral 04/21/2016  . BREAST RECONSTRUCTION WITH PLACEMENT OF TISSUE EXPANDER AND FLEX HD (ACELLULAR HYDRATED DERMIS) Bilateral 04/21/2016   Procedure: BREAST RECONSTRUCTION WITH PLACEMENT OF TISSUE EXPANDER AND ALLODERM;  Surgeon: Irene Limbo, MD;  Location: [facility];  Service: Plastics;  Laterality: Bilateral;  . [place]; 1983; 1987; 1990  . CESAREAN SECTION     done with last cesarean section  . CYST EXCISION  2007   on abdomen- 2 cyst removed; "benign"  . DILATION AND CURETTAGE OF UTERUS  ~ 1980; 1989  . GANGLION CYST EXCISION Right ~ 1975  . LAPAROSCOPIC ENDOMETRIOSIS FULGURATION  1989   w/D&C  . MASTECTOMY Bilateral 04/21/2016   LEFT SKIN SPARING MASTECTOMY WITH LEFT SENTINAL LYMPH NODE BIOPSY, RIGHT PROPHYLATIC SKIN SPARING MASTECTOMY   . NIPPLE SPARING MASTECTOMY/SENTINAL LYMPH NODE BIOPSY/RECONSTRUCTION/PLACEMENT OF TISSUE EXPANDER Bilateral 04/21/2016   Procedure: LEFT SKIN SPARING MASTECTOMY WITH LEFT SENTINAL LYMPH NODE BIOPSY, RIGHT PROPHYLATIC SKIN SPARING MASTECTOMY;  Surgeon: Fanny Skates, MD;  Location: [facility];  Service: General;  Laterality: Bilateral;  . PORT-A-CATH REMOVAL N/A 01/05/2017   Procedure: REMOVAL PORT-A-CATH;  Surgeon: Fanny Skates, MD;  Location: [facility];  Service: General;  Laterality: N/A;  . PORTACATH PLACEMENT Right 12/25/2015   Procedure: INSERTION PORT-A-CATH WITH [place];  Surgeon: Fanny Skates, MD;  Location[facility];  Service: General;  Laterality: Right;  . REMOVAL OF BILATERAL TISSUE EXPANDERS WITH PLACEMENT OF BILATERAL BREAST IMPLANTS Bilateral 07/28/2016   Procedure: REMOVAL OF BILATERAL TISSUE EXPANDERS WITH PLACEMENT OF BILATERAL SILICONE BREAST IMPLANTS;  Surgeon: Irene Limbo, MD;  Location: [facility];  Service: Plastics;  Laterality: Bilateral;  . ROBOTIC ASSISTED BILATERAL SALPINGO OOPHERECTOMY Bilateral 01/05/2017   Procedure: XI ROBOTIC ASSISTED BILATERAL SALPINGO OOPHORECTOMY; BILATERAL  URETEROLYSIS;  Surgeon: Everitt Amber, MD;  Location: [facility];  Service: Gynecology;  Laterality: Bilateral;  . TONSILLECTOMY    . WISDOM TOOTH EXTRACTION  1978    FAMILY HISTORY Family History  Problem Relation Age of Onset  . Other Mother        benign meningioma dx 78 s/p surgery; hx of hysterectomy for hemorrhaging  . Hypothyroidism Mother   . CAD Mother   . Colon cancer Father 16       w/ mets to lungs and liver; + chemo; d. 19  . Gout Father   . Arthritis Father   . Ovarian cancer Sister        early 49s; "precancerous cells" s/p USO  . Breast cancer Sister 23       s/p mastectomy and aromatase inhibitor  . Other Paternal Aunt 63       Primary peritoneal cancer; s/p surgery  . Cancer Paternal Uncle   . Ovarian cancer Sister 41       "pre-cancer"; s/p BSO  . Cervical cancer Sister   . Basal cell carcinoma Sister   . Colon polyps Sister        unspecified number  . Breast cancer Cousin 27       s/p mastectomy  . Ovarian cancer Cousin 44       s/p BSO  . Leukemia Cousin 55  . Cancer Cousin        dx carcinoid bowel tumor  . Other Cousin        reportedly negative "BRCA1/2 testing"  .  Basal cell carcinoma Cousin   . Breast cancer Cousin 30       reportedly negative GT is 2014-2015  . CAD Maternal Grandmother        d. 90s  . Heart attack Maternal Grandfather 51  . Heart Problems Paternal Grandmother   . Heart Problems Paternal Grandfather   . CAD Brother        triple vessel CAD, d. 24  . Other Son        enlarged prostate w/ surveillance  . Thyroid cancer Maternal Uncle 71       unspecified type  . CAD Maternal Uncle        d. 57  . Other Cousin 48       dx benign "skin polyp"  . Breast cancer Other        paternal great aunt (PGM's sister)  . Breast cancer Other        paternal great aunt (PGM's sister)    GYNECOLOGIC HISTORY:  No LMP recorded. Patient has had a hysterectomy. Menarche age 22, first live birth age 65. The patient is GX P4. She stopped  having periods in 1989. She did not use hormone replacement. She used oral contraceptives less than one year.  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  Her husband, [name] is retired from a [employer].  They are moving to [facility] as of June 2019 son, [name], lives in [place], [place] and works as a Chief Strategy Officer. Son, [name], lives in [place], [place] and is with the [employer]. Son, [name], lives in [place] where he works as an Chief Financial Officer. Son, [name], lives in [place], were he works as an Chief Financial Officer. The patient has 4 grandchildren including a 69-year-old they have custody of and lives with them. The patient attends our [facility] [employer] [facility] [employer] of [employer].    ADVANCED DIRECTIVES: In place   HEALTH MAINTENANCE: Social History   Tobacco Use  . Smoking status: Former Smoker    Years: 0.25    Types: Cigarettes    Last attempt to quit: 05/25/1974    Years since quitting: 43.4  . Smokeless tobacco: Never Used  . Tobacco comment: was only a social/weekend smoker; smoked for a couple months total  Substance Use Topics  . Alcohol use: Yes    Alcohol/week: 0.5 oz    Types: 1 Standard drinks or equivalent per week    Comment: 04/21/2016 "1 glass wine a few times/year"  . Drug use: No    Comment: last used 25 yrs ago     Colonoscopy:  PAP:  Bone density:08/05/2016 shows a T score of -0.9 (normal).   Allergies  Allergen Reactions  . Codeine Itching    Can not tolerate tylenol #3  . Morphine And Related Hives, Nausea And Vomiting and Rash  . Vicodin [Hydrocodone-Acetaminophen] Palpitations    Current Outpatient Medications  Medication Sig Dispense Refill  . anastrozole (ARIMIDEX) 1 MG tablet Take 1 tablet (1 mg total) by mouth at bedtime. 90 tablet 3  . Ascorbic Acid (VITAMIN C) 1000 MG tablet Take 1,000 mg by mouth daily.    Marland Kitchen aspirin EC 81 MG tablet Take 81 mg by mouth daily.    Marland Kitchen atorvastatin (LIPITOR) 40 MG  tablet Take 40 mg by mouth at bedtime.   5  . Cetirizine HCl 10 MG CAPS Take 10 mg by mouth daily as needed (allergies).     . Cholecalciferol (VITAMIN  D) 2000 units CAPS Take 2,000 Units by mouth daily.    Marland Kitchen CRANBERRY PO Take 1,500 mg by mouth daily.    . Cyanocobalamin (VITAMIN B-12) 5000 MCG TBDP Take 5,000 mcg by mouth daily.    Marland Kitchen etodolac (LODINE) 400 MG tablet Take 400 mg by mouth 2 (two) times daily as needed for mild pain.   2  . FIBER COMPLETE PO Take 5 tablets by mouth daily.     Marland Kitchen ibuprofen (ADVIL,MOTRIN) 200 MG tablet Take 400 mg by mouth daily as needed for headache or moderate pain.    . Multiple Vitamin (MULTIVITAMIN) tablet Take 1 tablet by mouth daily.      . nitroGLYCERIN (NITROLINGUAL) 0.4 MG/SPRAY spray Place 1 spray under the tongue every 5 (five) minutes x 3 doses as needed for chest pain.    . Omega-3 Fatty Acids (FISH OIL OMEGA-3 PO) Take 1 tablet by mouth daily.    . Probiotic Product (ALIGN PO) Take 1 tablet by mouth daily.     . sodium chloride (OCEAN) 0.65 % SOLN nasal spray Place 1 spray into both nostrils as needed for congestion.     No current facility-administered medications for this visit.    Facility-Administered Medications Ordered in Other Visits  Medication Dose Route Frequency Provider Last Rate Last Dose  . sodium chloride 0.9 % injection 10 mL  10 mL Intravenous PRN Gerrit Rafalski, Virgie Dad, MD   10 mL at 06/15/16 0846    OBJECTIVE: Middle-aged white woman in no acute distress  Vitals:   10/27/17 1555  BP: 129/81  Pulse: 72  Resp: 18  Temp: 98.7 F (37.1 C)  SpO2: 98%     Body mass index is 25.91 kg/m.    ECOG FS:0 - Asymptomatic  Sclerae unicteric, EOMs intact Oropharynx clear and moist No cervical or supraclavicular adenopathy Lungs no rales or rhonchi Heart regular rate and rhythm Abd soft, nontender, positive bowel sounds MSK no focal spinal tenderness, no upper extremity lymphedema Neuro: nonfocal, well oriented, appropriate  affect Breasts: Status post bilateral mastectomies with bilateral implant reconstruction.  There is no evidence of chest wall recurrence.  Both axillae are benign.   LAB RESULTS:  CMP     Component Value Date/Time   NA 140 10/27/2017 1529   NA 139 04/14/2017 1527   K 4.3 10/27/2017 1529   K 4.1 04/14/2017 1527   CL 103 10/27/2017 1529   CO2 28 10/27/2017 1529   CO2 28 04/14/2017 1527   GLUCOSE 84 10/27/2017 1529   GLUCOSE 87 04/14/2017 1527   BUN 21 10/27/2017 1529   BUN 25.0 04/14/2017 1527   CREATININE 0.87 10/27/2017 1529   CREATININE 0.9 04/14/2017 1527   CALCIUM 9.6 10/27/2017 1529   CALCIUM 9.5 04/14/2017 1527   PROT 7.7 10/27/2017 1529   PROT 7.5 04/14/2017 1527   ALBUMIN 4.3 10/27/2017 1529   ALBUMIN 4.1 04/14/2017 1527   AST 23 10/27/2017 1529   AST 24 04/14/2017 1527   ALT 16 10/27/2017 1529   ALT 19 04/14/2017 1527   ALKPHOS 75 10/27/2017 1529   ALKPHOS 75 04/14/2017 1527   BILITOT 0.3 10/27/2017 1529   BILITOT 0.26 04/14/2017 1527   GFRNONAA >60 10/27/2017 1529   GFRAA >60 10/27/2017 1529    INo results found for: SPEP, UPEP  Lab Results  Component Value Date   WBC 5.0 10/27/2017   NEUTROABS 2.8 10/27/2017   HGB 13.2 10/27/2017   HCT 38.8 10/27/2017   MCV 88.4 10/27/2017  PLT 185 10/27/2017      Chemistry      Component Value Date/Time   NA 140 10/27/2017 1529   NA 139 04/14/2017 1527   K 4.3 10/27/2017 1529   K 4.1 04/14/2017 1527   CL 103 10/27/2017 1529   CO2 28 10/27/2017 1529   CO2 28 04/14/2017 1527   BUN 21 10/27/2017 1529   BUN 25.0 04/14/2017 1527   CREATININE 0.87 10/27/2017 1529   CREATININE 0.9 04/14/2017 1527      Component Value Date/Time   CALCIUM 9.6 10/27/2017 1529   CALCIUM 9.5 04/14/2017 1527   ALKPHOS 75 10/27/2017 1529   ALKPHOS 75 04/14/2017 1527   AST 23 10/27/2017 1529   AST 24 04/14/2017 1527   ALT 16 10/27/2017 1529   ALT 19 04/14/2017 1527   BILITOT 0.3 10/27/2017 1529   BILITOT 0.26 04/14/2017 1527        No results found for: LABCA2  No components found for: LABCA125  No results for input(s): INR in the last 168 hours.  Urinalysis    Component Value Date/Time   COLORURINE YELLOW 01/01/2017 1315   APPEARANCEUR CLEAR 01/01/2017 1315   LABSPEC 1.018 01/01/2017 1315   PHURINE 5.0 01/01/2017 1315   GLUCOSEU NEGATIVE 01/01/2017 1315   HGBUR NEGATIVE 01/01/2017 1315   BILIRUBINUR NEGATIVE 01/01/2017 1315   KETONESUR NEGATIVE 01/01/2017 1315   PROTEINUR NEGATIVE 01/01/2017 1315   NITRITE NEGATIVE 01/01/2017 1315   LEUKOCYTESUR TRACE (A) 01/01/2017 1315     STUDIES: No results found.   ELIGIBLE FOR AVAILABLE RESEARCH PROTOCOL: no  ASSESSMENT: 62 y.o. [place], [place] woman status post left breast upper outer quadrant biopsy 12/02/2015 for a clinical T1c pN0, stage IA  invasive ductal carcinoma, grade 3, estrogen and progesterone receptor positive, with an MIB-1 of 15%, and HER-2 amplification  (a) biopsy of a 0.6 cm lower outer quadrant right breast mass 12/26/2015  (1) neoadjuvant chemotherapy consisting of weekly paclitaxel 12 with trastuzumab and pertuzumab every 21 days 4 started 12/27/2015  (a) paclitaxel discontinued after 7 doses because of peripheral neuropathy  (b) received carboplatin/gemcitabine weekly 5 beginning 02/21/2016, last dose 03/27/2016  (c) cycle 3 of carboplatin/ delayed one week because of thrombocytopenia; carbo dose reduced 10%  (d) cycle 5 carboplatin dose reduced an additional 10% and gemcitabine reduced to 800 mg/m due to thrombocytopenia  (2) trastuzumab continued to total of one year (last dose 12/18/2016)  (a) echo showed on 07/13/2016 ejection fraction in the 55 % range.  (b) echocardiogram 08/28/2016 showed an ejection fraction in the 55-60% range  (c) echo 12/02/2016 shows EF 55-60%  (3) underwent bilateral skin sparing mastectomies with left sentinel lymph node sampling and immediate implant reconstruction 04/21/2016, showing  (a)  on the right, no evidence of malignancy  (b) on the left, a residual pT1c pN0, stage IA  invasive ductal carcinoma, grade 2  (c) repeat prognostic panel confirms triple positive disease   (d) negative margins  (4) postmastectomy radiation not indicated   (5) anastrozole started 05/01/2016  (a) baseline bone density 08/05/2016 shows a T score of -0.9 (normal).  (6) genetics testing 02/09/2016 through the 44-gene [facility] Custom Panel performed by [facility] [place]) found no deleterious mutations in APC, ATM, AXIN2, BARD1, BMPR1A, BRCA1, BRCA2, BRIP1, CDH1, CDKN2A, CHEK2, DICER1, EPCAM, GREM1, KIT, MEN1, MLH1, MSH2, MSH6, MUTYH, NBN, NF1, NF2, PALB2, PDGFRA, PMS2, POLD1, POLE, PTEN, RAD50, RAD51C, RAD51D, SDHA, SDHB, SDHC, SDHD, SMAD4, SMARCA4, SMARE1, STK11, TP53, TSC1, TSC2, and VHL  (  a)   One variant of uncertain significance (VUS) was found in "c.1691C>G (p.Ala564Gly)" in one copy of the DICER1 gene  (7) status post bilateral salpingo-oophorectomy 01/05/2017 with benign pathology   PLAN: Alexandra Mathis is now a year and a half out from definitive surgery for breast cancer with no evidence of disease recurrence.  This is very favorable.  She is tolerating anastrozole well and the plan is to continue that for a total of 5 years.  They will be moving to [facility] this month.  However they will be coming back to this area for various reasons.  She would like to continue to see [place] and I have made a return appointment with me in 1 year.  However I have also suggested that she establish herself with an oncologist little closer to where she will be living.  There are very good cancer facilities in [place] which is only 1 hour away.  That way if she does have a problem between now and then she will have a doctor who knows her already.  I suggested she see an oncologist either late this year or early next year  Otherwise she knows to call for any other issues that may develop  before next visit.   , Virgie Dad, MD  10/27/17 4:31 PM Medical Oncology and Hematology [facility] [address] [place], [place] [address] Tel. [phone]    Fax. [phone]  Alice Rieger, am acting as scribe for Chauncey Cruel MD.  I, Lurline Del MD, have reviewed the above documentation for accuracy and completeness, and I agree with the above.

## 2017-10-27 ENCOUNTER — Inpatient Hospital Stay: Payer: BC Managed Care – PPO | Attending: Oncology | Admitting: Oncology

## 2017-10-27 ENCOUNTER — Inpatient Hospital Stay: Payer: BC Managed Care – PPO

## 2017-10-27 VITALS — BP 129/81 | HR 72 | Temp 98.7°F | Resp 18 | Ht 67.0 in | Wt 165.4 lb

## 2017-10-27 DIAGNOSIS — C50412 Malignant neoplasm of upper-outer quadrant of left female breast: Secondary | ICD-10-CM

## 2017-10-27 DIAGNOSIS — Z8041 Family history of malignant neoplasm of ovary: Secondary | ICD-10-CM | POA: Insufficient documentation

## 2017-10-27 DIAGNOSIS — Z79899 Other long term (current) drug therapy: Secondary | ICD-10-CM | POA: Insufficient documentation

## 2017-10-27 DIAGNOSIS — Z803 Family history of malignant neoplasm of breast: Secondary | ICD-10-CM | POA: Diagnosis not present

## 2017-10-27 DIAGNOSIS — Z9013 Acquired absence of bilateral breasts and nipples: Secondary | ICD-10-CM | POA: Insufficient documentation

## 2017-10-27 DIAGNOSIS — Z87891 Personal history of nicotine dependence: Secondary | ICD-10-CM | POA: Diagnosis not present

## 2017-10-27 DIAGNOSIS — Z79811 Long term (current) use of aromatase inhibitors: Secondary | ICD-10-CM | POA: Insufficient documentation

## 2017-10-27 DIAGNOSIS — R232 Flushing: Secondary | ICD-10-CM | POA: Insufficient documentation

## 2017-10-27 DIAGNOSIS — E785 Hyperlipidemia, unspecified: Secondary | ICD-10-CM

## 2017-10-27 DIAGNOSIS — Z170 Estrogen receptor positive status [ER+]: Secondary | ICD-10-CM | POA: Diagnosis not present

## 2017-10-27 DIAGNOSIS — Z800 Family history of malignant neoplasm of digestive organs: Secondary | ICD-10-CM

## 2017-10-27 DIAGNOSIS — Z806 Family history of leukemia: Secondary | ICD-10-CM | POA: Insufficient documentation

## 2017-10-27 DIAGNOSIS — Z9221 Personal history of antineoplastic chemotherapy: Secondary | ICD-10-CM

## 2017-10-27 DIAGNOSIS — C773 Secondary and unspecified malignant neoplasm of axilla and upper limb lymph nodes: Secondary | ICD-10-CM | POA: Insufficient documentation

## 2017-10-27 DIAGNOSIS — M1990 Unspecified osteoarthritis, unspecified site: Secondary | ICD-10-CM

## 2017-10-27 DIAGNOSIS — Z7982 Long term (current) use of aspirin: Secondary | ICD-10-CM

## 2017-10-27 LAB — CBC WITH DIFFERENTIAL/PLATELET
BASOS ABS: 0 10*3/uL (ref 0.0–0.1)
Basophils Relative: 0 %
EOS PCT: 1 %
Eosinophils Absolute: 0.1 10*3/uL (ref 0.0–0.5)
HCT: 38.8 % (ref 34.8–46.6)
Hemoglobin: 13.2 g/dL (ref 11.6–15.9)
LYMPHS ABS: 1.7 10*3/uL (ref 0.9–3.3)
LYMPHS PCT: 33 %
MCH: 30.1 pg (ref 25.1–34.0)
MCHC: 34 g/dL (ref 31.5–36.0)
MCV: 88.4 fL (ref 79.5–101.0)
Monocytes Absolute: 0.5 10*3/uL (ref 0.1–0.9)
Monocytes Relative: 10 %
NEUTROS PCT: 56 %
Neutro Abs: 2.8 10*3/uL (ref 1.5–6.5)
PLATELETS: 185 10*3/uL (ref 145–400)
RBC: 4.39 MIL/uL (ref 3.70–5.45)
RDW: 13.3 % (ref 11.2–14.5)
WBC: 5 10*3/uL (ref 3.9–10.3)

## 2017-10-27 LAB — COMPREHENSIVE METABOLIC PANEL
ALK PHOS: 75 U/L (ref 40–150)
ALT: 16 U/L (ref 0–55)
AST: 23 U/L (ref 5–34)
Albumin: 4.3 g/dL (ref 3.5–5.0)
Anion gap: 9 (ref 3–11)
BUN: 21 mg/dL (ref 7–26)
CHLORIDE: 103 mmol/L (ref 98–109)
CO2: 28 mmol/L (ref 22–29)
CREATININE: 0.87 mg/dL (ref 0.60–1.10)
Calcium: 9.6 mg/dL (ref 8.4–10.4)
GFR calc Af Amer: >60 mL/min (ref >60)
Glucose, Bld: 84 mg/dL (ref 70–140)
Potassium: 4.3 mmol/L (ref 3.5–5.1)
SODIUM: 140 mmol/L (ref 136–145)
Total Bilirubin: 0.3 mg/dL (ref 0.2–1.2)
Total Protein: 7.7 g/dL (ref 6.4–8.3)

## 2017-10-27 MED ORDER — ANASTROZOLE 1 MG PO TABS: 1.0000 mg | ORAL_TABLET | Freq: Every day | ORAL | 3 refills | Status: AC

## 2017-11-23 IMAGING — MR MR BREAST BX W/ LOC DEV 1ST LEASION IMAGE BX SPEC MR GUIDE*R*
6 of 9 series · 33 of 48 positions shown · IV contrast (multihance)
Comparison: Prior exams.

ADDENDUM:
Pathology revealed FIBROCYSTIC CHANGES of the lower outer quadrant
of the Right breast. This was found to be concordant by Dr. Abercrombie
Xiangjie. Pathology results were discussed with the patient by
telephone. The patient reported doing well after the biopsy with
tenderness at the site. Post biopsy instructions and care were
reviewed and questions were answered. The patient was encouraged to
call The [REDACTED] for any additional
concerns. The patient has a recent diagnosis of Left breast cancer
and should follow her outlined treatment plan.

A 6 month follow up MRI is recommended if the patient does not
proceed with planned bilateral mastectomy (for known left breast
cancer).
Pathology results reported by Mikk-Andres Degtjarjov, RN on 12/27/2015.
CLINICAL DATA: 60-year-old female presenting for biopsy of a right
breast mass. The patient was recently diagnosed with invasive cancer
in the left breast with DCIS.
EXAM:
MRI GUIDED CORE NEEDLE BIOPSY OF THE RIGHT BREAST
TECHNIQUE: Multiplanar, multisequence MR imaging of the right breast was
performed both before and after administration of intravenous
contrast.
CONTRAST:  14 mg of MultiHance.

[Series 2: axial pre-cm · axial · non-contrast · 1.3mm · 0.73mm/px · z∈[-97,+89]mm · 6 of 144 slices shown]
[im 1/144]
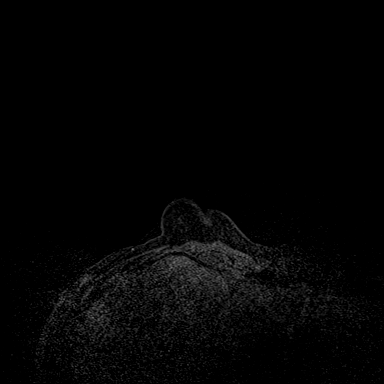
[im 29/144]
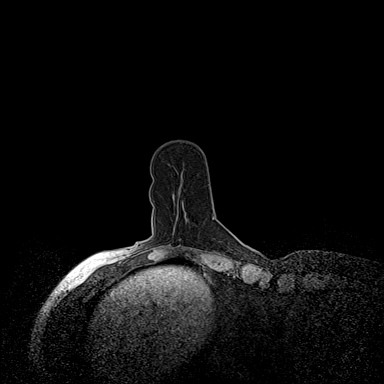
[im 58/144]
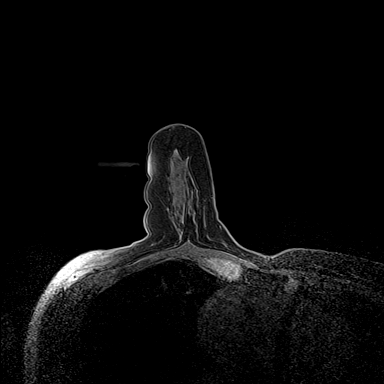
[im 86/144]
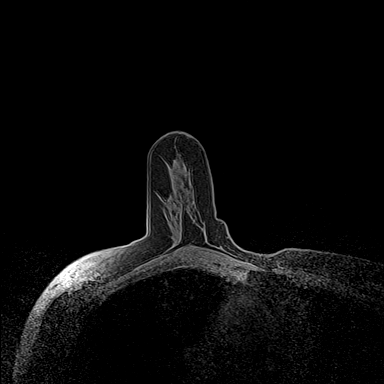
[im 115/144]
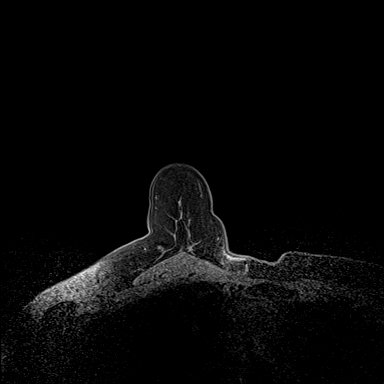
[im 144/144]
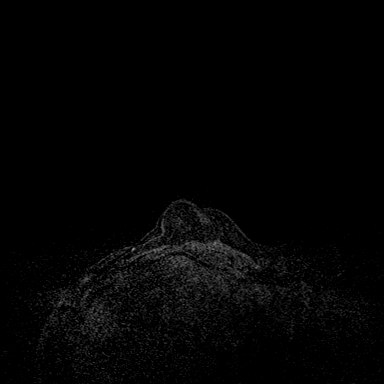

[Series 3: axial pre-cm_s2_(id) · axial · non-contrast · 1.3mm · 0.73mm/px · z∈[-97,+89]mm · 6 of 144 slices shown]
[im 1/144]
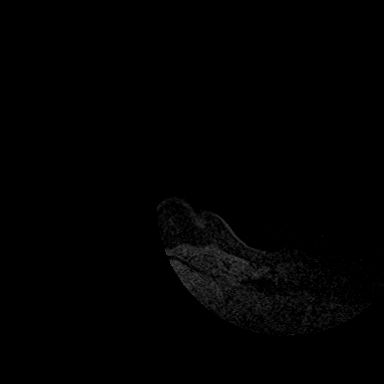
[im 29/144]
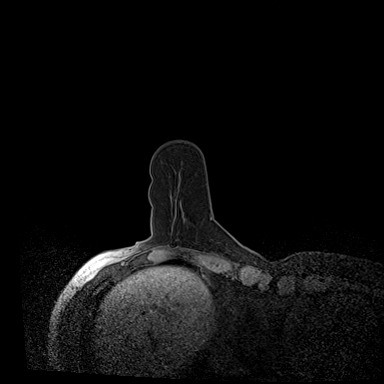
[im 58/144]
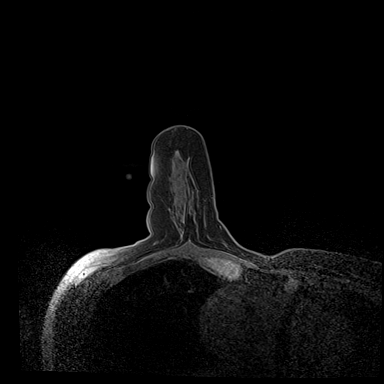
[im 86/144]
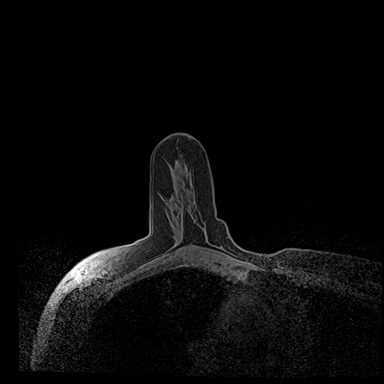
[im 115/144]
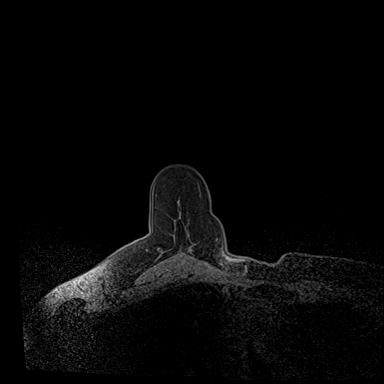
[im 144/144]
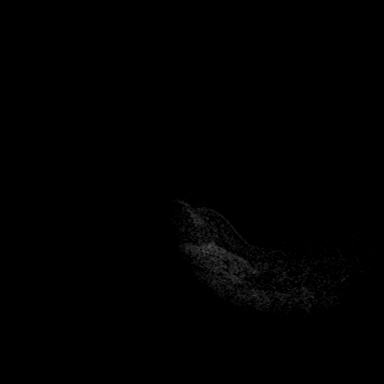

[Series 4: axial post 20 · axial · 1.3mm · 0.73mm/px · z∈[-97,+89]mm · 6 of 144 slices shown (1 of 3)]
[im 1/144]
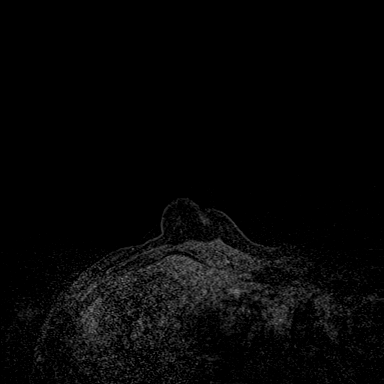
[im 29/144]
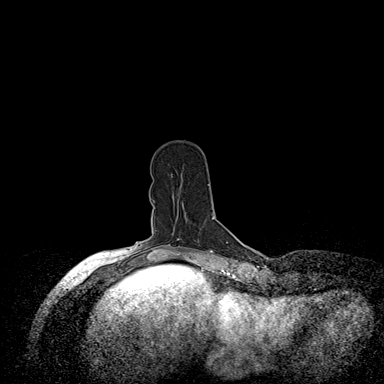
[im 58/144]
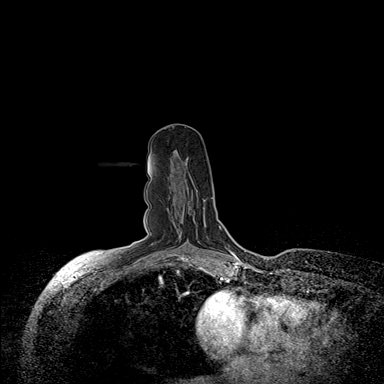
[im 86/144]
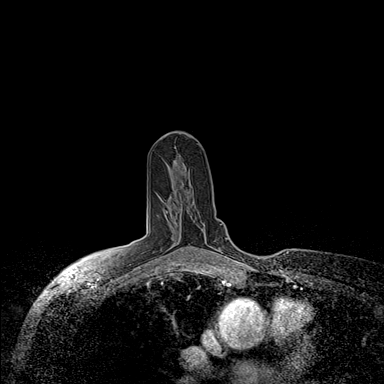
[im 115/144]
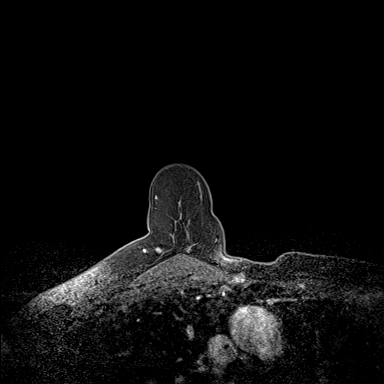
[im 144/144]
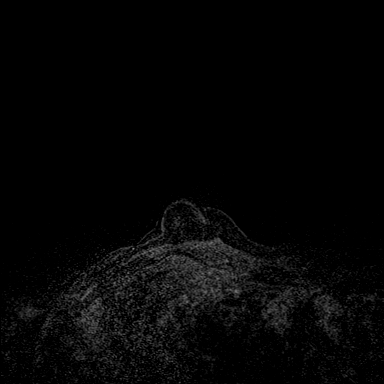

[Series 5: axial post 20 · axial · 1.3mm · 0.73mm/px · z∈[-97,+89]mm · 5 of 144 slices shown (2 of 3)]
[im 1/144]
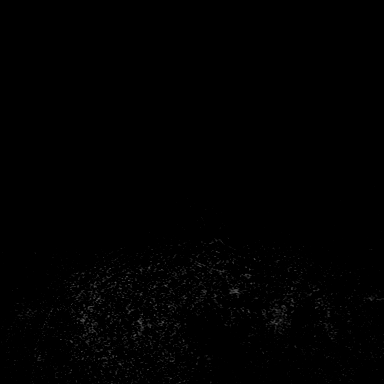
[im 36/144]
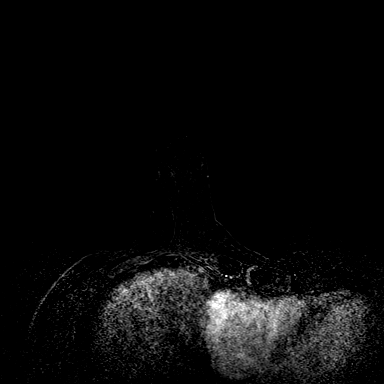
[im 72/144]
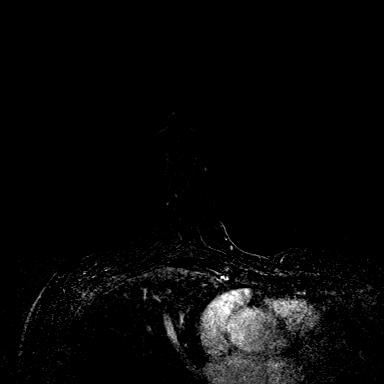
[im 108/144]
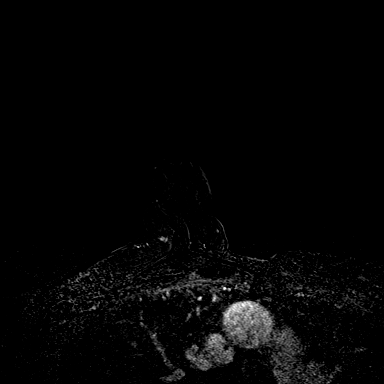
[im 144/144]
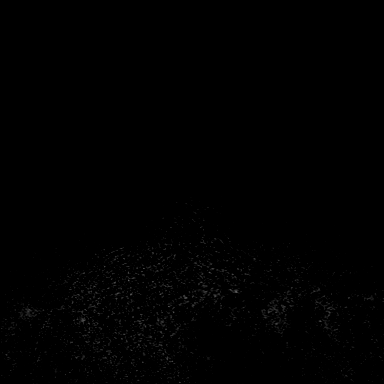

[Series 6: axial post 20 · axial · 1.3mm · 0.73mm/px · z∈[-97,+89]mm · 5 of 144 slices shown (3 of 3)]
[im 1/144]
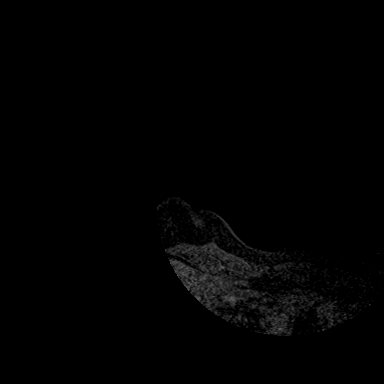
[im 36/144]
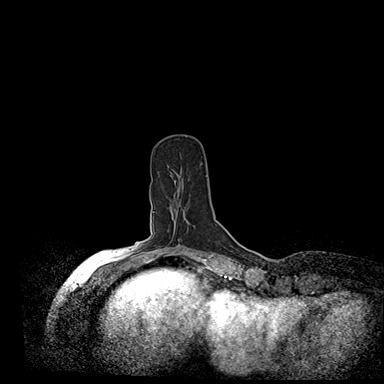
[im 72/144]
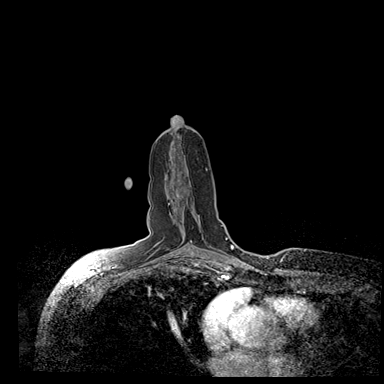
[im 108/144]
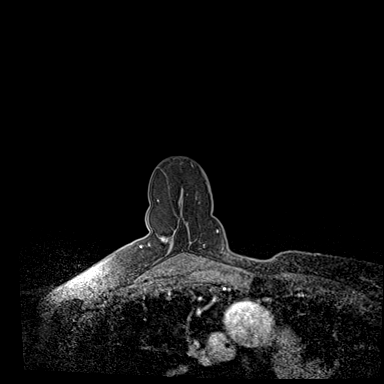
[im 144/144]
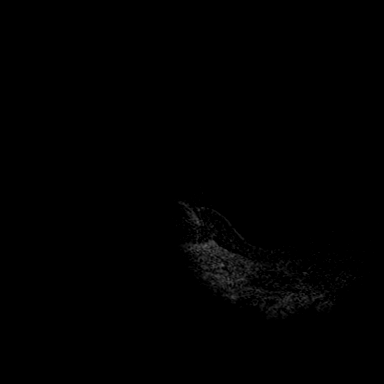

[Series 7: axial confirmation · axial · 1.3mm · 0.73mm/px · z∈[-97,+89]mm · 5 of 144 slices shown]
[im 1/144]
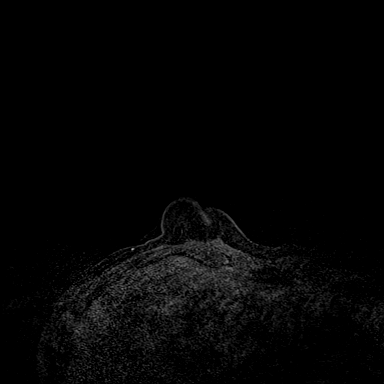
[im 36/144]
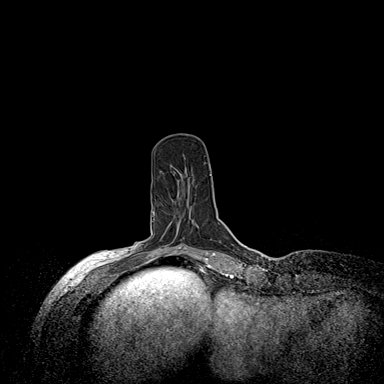
[im 72/144]
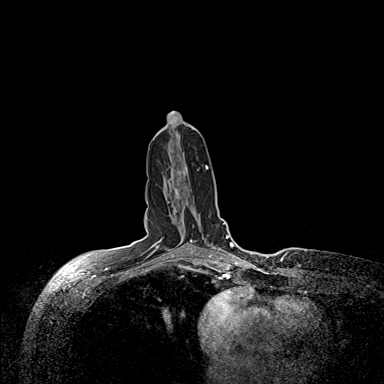
[im 108/144]
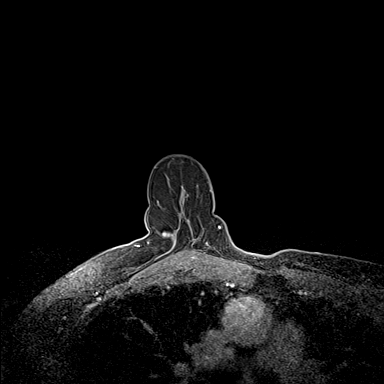
[im 144/144]
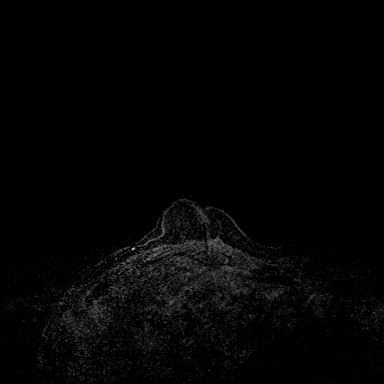

[33 of 48 positions shown; findings below may reference images not displayed]

FINDINGS: I met with the patient, and we discussed the procedure of MRI guided
biopsy, including risks, benefits, and alternatives. Specifically,
we discussed the risks of infection, bleeding, tissue injury, clip
migration, and inadequate sampling. Informed, written consent was
given. The usual time out protocol was performed immediately prior
to the procedure.

Using sterile technique, 1% Lidocaine, MRI guidance, and a 9 gauge
vacuum assisted device, biopsy was performed of a small mass in the
lower outer quadrant of the right breast using a lateral approach.
At the conclusion of the procedure, a dumbbell shaped tissue marker
clip was deployed into the biopsy cavity. Follow-up 2-view mammogram
was performed and dictated separately.
IMPRESSION: MRI guided biopsy of a small mass in the lower outer quadrant of the
right breast.. No apparent complications.

## 2017-12-06 ENCOUNTER — Ambulatory Visit: Payer: BC Managed Care – PPO | Admitting: Adult Health

## 2017-12-06 ENCOUNTER — Other Ambulatory Visit: Payer: BC Managed Care – PPO

## 2017-12-13 ENCOUNTER — Ambulatory Visit: Payer: BC Managed Care – PPO | Admitting: Oncology

## 2017-12-13 ENCOUNTER — Other Ambulatory Visit: Payer: BC Managed Care – PPO

## 2020-03-14 ENCOUNTER — Telehealth (HOSPITAL_COMMUNITY): Payer: Self-pay | Admitting: *Deleted

## 2020-03-14 NOTE — Telephone Encounter (Signed)
Records faxed to [phone] as requested

## 2020-06-27 ENCOUNTER — Encounter (HOSPITAL_COMMUNITY): Payer: Self-pay

## 2020-06-27 NOTE — Progress Notes (Signed)
Records request from [facility] [place]) received. All OV, most recent labs and echo sent to [phone]

## 2020-11-22 ENCOUNTER — Encounter: Payer: Self-pay | Admitting: Oncology

## 2022-12-03 ENCOUNTER — Other Ambulatory Visit: Payer: Self-pay

## 2023-02-02 ENCOUNTER — Other Ambulatory Visit: Payer: Self-pay | Admitting: Pharmacist
